# Patient Record
Sex: Female | Born: 1965 | Race: White | Hispanic: No | Marital: Married | Smoking: Never smoker
Health system: Southern US, Community
[De-identification: ages and names within clinical notes are randomized; demographics above are authoritative.]

## PROBLEM LIST (undated history)

## (undated) DIAGNOSIS — M1990 Unspecified osteoarthritis, unspecified site: Secondary | ICD-10-CM

## (undated) DIAGNOSIS — K219 Gastro-esophageal reflux disease without esophagitis: Secondary | ICD-10-CM

## (undated) DIAGNOSIS — J189 Pneumonia, unspecified organism: Secondary | ICD-10-CM

## (undated) DIAGNOSIS — I739 Peripheral vascular disease, unspecified: Secondary | ICD-10-CM

## (undated) DIAGNOSIS — E0590 Thyrotoxicosis, unspecified without thyrotoxic crisis or storm: Secondary | ICD-10-CM

## (undated) HISTORY — PX: TONSILLECTOMY: SUR1361

---

## 2015-01-10 ENCOUNTER — Ambulatory Visit: Payer: Self-pay | Admitting: Family Medicine

## 2020-09-08 ENCOUNTER — Ambulatory Visit: Payer: Self-pay | Admitting: Dermatology

## 2020-11-16 ENCOUNTER — Ambulatory Visit: Payer: Self-pay | Admitting: Dermatology

## 2020-11-17 ENCOUNTER — Ambulatory Visit: Payer: Self-pay | Admitting: Dermatology

## 2020-12-30 ENCOUNTER — Ambulatory Visit: Payer: Self-pay | Admitting: Dermatology

## 2021-02-18 ENCOUNTER — Ambulatory Visit: Payer: Self-pay | Admitting: Dermatology

## 2021-05-18 ENCOUNTER — Ambulatory Visit (INDEPENDENT_AMBULATORY_CARE_PROVIDER_SITE_OTHER): Payer: BC Managed Care – PPO | Admitting: Dermatology

## 2021-05-18 ENCOUNTER — Other Ambulatory Visit: Payer: Self-pay

## 2021-05-18 ENCOUNTER — Encounter: Payer: Self-pay | Admitting: Dermatology

## 2021-05-18 DIAGNOSIS — S70362A Insect bite (nonvenomous), left thigh, initial encounter: Secondary | ICD-10-CM | POA: Diagnosis not present

## 2021-05-18 DIAGNOSIS — C4491 Basal cell carcinoma of skin, unspecified: Secondary | ICD-10-CM

## 2021-05-18 DIAGNOSIS — C44319 Basal cell carcinoma of skin of other parts of face: Secondary | ICD-10-CM

## 2021-05-18 DIAGNOSIS — W57XXXA Bitten or stung by nonvenomous insect and other nonvenomous arthropods, initial encounter: Secondary | ICD-10-CM | POA: Diagnosis not present

## 2021-05-18 DIAGNOSIS — L820 Inflamed seborrheic keratosis: Secondary | ICD-10-CM

## 2021-05-18 DIAGNOSIS — D485 Neoplasm of uncertain behavior of skin: Secondary | ICD-10-CM

## 2021-05-18 DIAGNOSIS — L570 Actinic keratosis: Secondary | ICD-10-CM | POA: Diagnosis not present

## 2021-05-18 DIAGNOSIS — Z1283 Encounter for screening for malignant neoplasm of skin: Secondary | ICD-10-CM | POA: Diagnosis not present

## 2021-05-18 HISTORY — DX: Basal cell carcinoma of skin, unspecified: C44.91

## 2021-05-25 ENCOUNTER — Telehealth: Payer: Self-pay

## 2021-05-25 NOTE — Telephone Encounter (Signed)
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  She can choose between [place] and [place].  She has other ongoing medical issues and this is certainly not an emergency.

## 2021-05-25 NOTE — Telephone Encounter (Signed)
Patient aware mohs forehead

## 2021-05-26 ENCOUNTER — Telehealth: Payer: Self-pay | Admitting: *Deleted

## 2021-05-26 ENCOUNTER — Encounter: Payer: Self-pay | Admitting: *Deleted

## 2021-05-26 NOTE — Telephone Encounter (Signed)
Sent referral to skin surgery center for treatment of forehead.

## 2021-05-26 NOTE — Telephone Encounter (Signed)
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  She can choose between [place] and [place].  She has other ongoing medical issues and this is certainly not an emergency.

## 2021-06-01 ENCOUNTER — Encounter: Payer: Self-pay | Admitting: Dermatology

## 2021-06-01 NOTE — Progress Notes (Signed)
Follow-Up Visit   Subjective  Kristie Brooks is a 55 y.o. female who presents for the following: Annual Exam (Forehead x months scale comes and goes, front chest shoulder area, post thigh and front thigh picked and bleeding).  General skin examination with multiple new spots to check including tick bite on leg Location:  Duration:  Quality:  Associated Signs/Symptoms: Modifying Factors:  Severity:  Timing: Context:   Objective  Well appearing patient in no apparent distress; mood and affect are within normal limits. Left Anterior Neck       Left Zygomatic Area       Mid Forehead       Right Zygomatic Area         A full examination was performed including scalp, head, eyes, ears, nose, lips, neck, chest, axillae, abdomen, back, buttocks, bilateral upper extremities, bilateral lower extremities, hands, feet, fingers, toes, fingernails, and toenails. All findings within normal limits unless otherwise noted below.  Areas beneath undergarments and leg dressings not fully examined.   Assessment & Plan    Neoplasm of uncertain behavior of skin (4) Left Anterior Neck  Skin / nail biopsy Type of biopsy: tangential   Informed consent: discussed and consent obtained   Timeout: patient name, date of birth, surgical site, and procedure verified   Procedure prep:  Patient was prepped and draped in usual sterile fashion (Non sterile) Prep type:  Chlorhexidine Anesthesia: the lesion was anesthetized in a standard fashion   Anesthetic:  1% lidocaine w/ epinephrine 1-100,000 local infiltration Instrument used: flexible razor blade   Hemostasis achieved with: ferric subsulfate and electrodesiccation   Outcome: patient tolerated procedure well   Post-procedure details: sterile dressing applied and wound care instructions given   Dressing type: bandage and petrolatum    Specimen 1 - Surgical pathology Differential Diagnosis: R/O Tag - cautery   Check  Margins: No  Left Zygomatic Area  Skin / nail biopsy Type of biopsy: tangential   Informed consent: discussed and consent obtained   Timeout: patient name, date of birth, surgical site, and procedure verified   Procedure prep:  Patient was prepped and draped in usual sterile fashion (Non sterile) Prep type:  Chlorhexidine Anesthesia: the lesion was anesthetized in a standard fashion   Anesthetic:  1% lidocaine w/ epinephrine 1-100,000 local infiltration Instrument used: flexible razor blade   Hemostasis achieved with: ferric subsulfate and electrodesiccation   Outcome: patient tolerated procedure well   Post-procedure details: sterile dressing applied and wound care instructions given   Dressing type: bandage and petrolatum    Specimen 2 - Surgical pathology Differential Diagnosis: R/O ISK - cautery  Check Margins: No  Mid Forehead  Skin / nail biopsy Type of biopsy: tangential   Informed consent: discussed and consent obtained   Timeout: patient name, date of birth, surgical site, and procedure verified   Procedure prep:  Patient was prepped and draped in usual sterile fashion (Non sterile) Prep type:  Chlorhexidine Anesthesia: the lesion was anesthetized in a standard fashion   Anesthetic:  1% lidocaine w/ epinephrine 1-100,000 local infiltration Instrument used: flexible razor blade   Hemostasis achieved with: ferric subsulfate   Outcome: patient tolerated procedure well   Post-procedure details: sterile dressing applied and wound care instructions given   Dressing type: bandage and petrolatum    Specimen 3 - Surgical pathology Differential Diagnosis: R/O BCC vs SCC  Check Margins: No  Right Zygomatic Area  Skin / nail biopsy Type of biopsy: tangential  Informed consent: discussed and consent obtained   Timeout: patient name, date of birth, surgical site, and procedure verified   Procedure prep:  Patient was prepped and draped in usual sterile fashion (Non  sterile) Prep type:  Chlorhexidine Anesthesia: the lesion was anesthetized in a standard fashion   Anesthetic:  1% lidocaine w/ epinephrine 1-100,000 local infiltration Instrument used: flexible razor blade   Hemostasis achieved with: ferric subsulfate and electrodesiccation   Outcome: patient tolerated procedure well   Post-procedure details: sterile dressing applied and wound care instructions given   Dressing type: bandage and petrolatum    Specimen 4 - Surgical pathology Differential Diagnosis: R/O ISK - cautery  Check Margins: No  Inflamed seborrheic keratosis Left Breast  AK (actinic keratosis) (2) Right Breast; Right Dorsal Hand  Destruction of lesion - Right Breast, Right Dorsal Hand Complexity: simple   Destruction method: cryotherapy   Informed consent: discussed and consent obtained   Timeout:  patient name, date of birth, surgical site, and procedure verified Lesion destroyed using liquid nitrogen: Yes   Cryotherapy cycles:  3 Outcome: patient tolerated procedure well with no complications   Post-procedure details: wound care instructions given    Tick bite of thigh, left, initial encounter Left Thigh - Posterior   Patient was tearful during much of the early part of the visit; she has suffered a great deal relating to her leg lesions.   I, Lavonna Monarch, MD, have reviewed all documentation for this visit.  The documentation on 06/01/21 for the exam, diagnosis, procedures, and orders are all accurate and complete.

## 2021-12-15 ENCOUNTER — Emergency Department (HOSPITAL_COMMUNITY): Payer: Managed Care, Other (non HMO)

## 2021-12-15 ENCOUNTER — Other Ambulatory Visit: Payer: Self-pay

## 2021-12-15 ENCOUNTER — Inpatient Hospital Stay (HOSPITAL_COMMUNITY)
Admission: EM | Admit: 2021-12-15 | Discharge: 2021-12-26 | DRG: 871 | Disposition: A | Discharge status: Skilled Nursing Facility | Payer: Managed Care, Other (non HMO) | Source: Skilled Nursing Facility | Attending: Internal Medicine | Admitting: Internal Medicine

## 2021-12-15 ENCOUNTER — Encounter (HOSPITAL_COMMUNITY): Payer: Self-pay

## 2021-12-15 DIAGNOSIS — K920 Hematemesis: Secondary | ICD-10-CM

## 2021-12-15 DIAGNOSIS — D696 Thrombocytopenia, unspecified: Secondary | ICD-10-CM | POA: Diagnosis present

## 2021-12-15 DIAGNOSIS — A419 Sepsis, unspecified organism: Secondary | ICD-10-CM | POA: Diagnosis not present

## 2021-12-15 DIAGNOSIS — Z79890 Hormone replacement therapy (postmenopausal): Secondary | ICD-10-CM

## 2021-12-15 DIAGNOSIS — D689 Coagulation defect, unspecified: Secondary | ICD-10-CM | POA: Diagnosis present

## 2021-12-15 DIAGNOSIS — I9589 Other hypotension: Secondary | ICD-10-CM | POA: Diagnosis present

## 2021-12-15 DIAGNOSIS — B942 Sequelae of viral hepatitis: Secondary | ICD-10-CM

## 2021-12-15 DIAGNOSIS — R579 Shock, unspecified: Secondary | ICD-10-CM

## 2021-12-15 DIAGNOSIS — K2100 Gastro-esophageal reflux disease with esophagitis, without bleeding: Secondary | ICD-10-CM | POA: Diagnosis present

## 2021-12-15 DIAGNOSIS — K7469 Other cirrhosis of liver: Secondary | ICD-10-CM | POA: Diagnosis present

## 2021-12-15 DIAGNOSIS — I10 Essential (primary) hypertension: Secondary | ICD-10-CM | POA: Diagnosis present

## 2021-12-15 DIAGNOSIS — E8809 Other disorders of plasma-protein metabolism, not elsewhere classified: Secondary | ICD-10-CM | POA: Diagnosis present

## 2021-12-15 DIAGNOSIS — L89322 Pressure ulcer of left buttock, stage 2: Secondary | ICD-10-CM | POA: Diagnosis present

## 2021-12-15 DIAGNOSIS — J302 Other seasonal allergic rhinitis: Secondary | ICD-10-CM | POA: Diagnosis present

## 2021-12-15 DIAGNOSIS — I890 Lymphedema, not elsewhere classified: Secondary | ICD-10-CM | POA: Diagnosis present

## 2021-12-15 DIAGNOSIS — L03116 Cellulitis of left lower limb: Secondary | ICD-10-CM | POA: Diagnosis present

## 2021-12-15 DIAGNOSIS — L03115 Cellulitis of right lower limb: Secondary | ICD-10-CM | POA: Diagnosis present

## 2021-12-15 DIAGNOSIS — R609 Edema, unspecified: Secondary | ICD-10-CM | POA: Diagnosis not present

## 2021-12-15 DIAGNOSIS — I872 Venous insufficiency (chronic) (peripheral): Secondary | ICD-10-CM | POA: Diagnosis present

## 2021-12-15 DIAGNOSIS — G9341 Metabolic encephalopathy: Secondary | ICD-10-CM | POA: Diagnosis present

## 2021-12-15 DIAGNOSIS — L8990 Pressure ulcer of unspecified site, unspecified stage: Secondary | ICD-10-CM | POA: Insufficient documentation

## 2021-12-15 DIAGNOSIS — J9611 Chronic respiratory failure with hypoxia: Secondary | ICD-10-CM | POA: Diagnosis present

## 2021-12-15 DIAGNOSIS — R188 Other ascites: Secondary | ICD-10-CM

## 2021-12-15 DIAGNOSIS — Z85828 Personal history of other malignant neoplasm of skin: Secondary | ICD-10-CM

## 2021-12-15 DIAGNOSIS — Z8249 Family history of ischemic heart disease and other diseases of the circulatory system: Secondary | ICD-10-CM

## 2021-12-15 DIAGNOSIS — Z79899 Other long term (current) drug therapy: Secondary | ICD-10-CM

## 2021-12-15 DIAGNOSIS — L03119 Cellulitis of unspecified part of limb: Secondary | ICD-10-CM

## 2021-12-15 DIAGNOSIS — K449 Diaphragmatic hernia without obstruction or gangrene: Secondary | ICD-10-CM | POA: Diagnosis present

## 2021-12-15 DIAGNOSIS — E872 Acidosis, unspecified: Secondary | ICD-10-CM | POA: Diagnosis present

## 2021-12-15 DIAGNOSIS — K7011 Alcoholic hepatitis with ascites: Secondary | ICD-10-CM

## 2021-12-15 DIAGNOSIS — E039 Hypothyroidism, unspecified: Secondary | ICD-10-CM | POA: Diagnosis present

## 2021-12-15 DIAGNOSIS — R6520 Severe sepsis without septic shock: Secondary | ICD-10-CM | POA: Diagnosis present

## 2021-12-15 DIAGNOSIS — Z6841 Body Mass Index (BMI) 40.0 and over, adult: Secondary | ICD-10-CM

## 2021-12-15 DIAGNOSIS — R601 Generalized edema: Secondary | ICD-10-CM

## 2021-12-15 DIAGNOSIS — K7460 Unspecified cirrhosis of liver: Secondary | ICD-10-CM

## 2021-12-15 DIAGNOSIS — K3189 Other diseases of stomach and duodenum: Secondary | ICD-10-CM | POA: Diagnosis present

## 2021-12-15 DIAGNOSIS — Z20822 Contact with and (suspected) exposure to covid-19: Secondary | ICD-10-CM | POA: Diagnosis present

## 2021-12-15 DIAGNOSIS — K5641 Fecal impaction: Secondary | ICD-10-CM | POA: Diagnosis present

## 2021-12-15 DIAGNOSIS — E876 Hypokalemia: Secondary | ICD-10-CM | POA: Diagnosis not present

## 2021-12-15 DIAGNOSIS — K7682 Hepatic encephalopathy: Secondary | ICD-10-CM | POA: Diagnosis present

## 2021-12-15 LAB — COMPREHENSIVE METABOLIC PANEL
ALT: 15 U/L (ref 0–44)
AST: 34 U/L (ref 15–41)
Albumin: 1.9 g/dL — ABNORMAL LOW (ref 3.5–5.0)
Alkaline Phosphatase: 103 U/L (ref 38–126)
Anion gap: 8 (ref 5–15)
BUN: 14 mg/dL (ref 6–20)
CO2: 26 mmol/L (ref 22–32)
Calcium: 8 mg/dL — ABNORMAL LOW (ref 8.9–10.3)
Chloride: 99 mmol/L (ref 98–111)
Creatinine, Ser: 1.03 mg/dL — ABNORMAL HIGH (ref 0.44–1.00)
GFR, Estimated: >60 mL/min (ref >60)
Glucose, Bld: 103 mg/dL — ABNORMAL HIGH (ref 70–99)
Potassium: 3.7 mmol/L (ref 3.5–5.1)
Sodium: 133 mmol/L — ABNORMAL LOW (ref 135–145)
Total Bilirubin: 1.8 mg/dL — ABNORMAL HIGH (ref 0.3–1.2)
Total Protein: 5.1 g/dL — ABNORMAL LOW (ref 6.5–8.1)

## 2021-12-15 LAB — CBC WITH DIFFERENTIAL/PLATELET
Abs Immature Granulocytes: 0.15 10*3/uL — ABNORMAL HIGH (ref 0.00–0.07)
Basophils Absolute: 0.1 10*3/uL (ref 0.0–0.1)
Basophils Relative: 1 %
Eosinophils Absolute: 0 10*3/uL (ref 0.0–0.5)
Eosinophils Relative: 0 %
HCT: 34.9 % — ABNORMAL LOW (ref 36.0–46.0)
Hemoglobin: 11.3 g/dL — ABNORMAL LOW (ref 12.0–15.0)
Immature Granulocytes: 1 %
Lymphocytes Relative: 5 %
Lymphs Abs: 0.8 10*3/uL (ref 0.7–4.0)
MCH: 29.9 pg (ref 26.0–34.0)
MCHC: 32.4 g/dL (ref 30.0–36.0)
MCV: 92.3 fL (ref 80.0–100.0)
Monocytes Absolute: 0.6 10*3/uL (ref 0.1–1.0)
Monocytes Relative: 4 %
Neutro Abs: 14.2 10*3/uL — ABNORMAL HIGH (ref 1.7–7.7)
Neutrophils Relative %: 89 %
Platelets: 126 10*3/uL — ABNORMAL LOW (ref 150–400)
RBC: 3.78 MIL/uL — ABNORMAL LOW (ref 3.87–5.11)
RDW: 17.4 % — ABNORMAL HIGH (ref 11.5–15.5)
WBC: 15.9 10*3/uL — ABNORMAL HIGH (ref 4.0–10.5)
nRBC: 0 % (ref 0.0–0.2)

## 2021-12-15 LAB — PROTIME-INR
INR: 1.5 — ABNORMAL HIGH (ref 0.8–1.2)
Prothrombin Time: 17.6 seconds — ABNORMAL HIGH (ref 11.4–15.2)

## 2021-12-15 LAB — RESP PANEL BY RT-PCR (FLU A&B, COVID) ARPGX2
Influenza A by PCR: NEGATIVE
Influenza B by PCR: NEGATIVE
SARS Coronavirus 2 by RT PCR: NEGATIVE

## 2021-12-15 LAB — APTT: aPTT: 30 seconds (ref 24–36)

## 2021-12-15 LAB — TROPONIN I (HIGH SENSITIVITY): Troponin I (High Sensitivity): 6 ng/L (ref <18)

## 2021-12-15 LAB — BRAIN NATRIURETIC PEPTIDE: B Natriuretic Peptide: 305 pg/mL — ABNORMAL HIGH (ref 0.0–100.0)

## 2021-12-15 LAB — HCG, QUANTITATIVE, PREGNANCY: hCG, Beta Chain, Quant, S: 1 m[IU]/mL (ref <5)

## 2021-12-15 LAB — LACTIC ACID, PLASMA: Lactic Acid, Venous: 2.3 mmol/L (ref 0.5–1.9)

## 2021-12-15 MED ORDER — FENTANYL CITRATE PF 50 MCG/ML IJ SOSY
50.0000 ug | PREFILLED_SYRINGE | Freq: Once | INTRAMUSCULAR | Status: AC
  Administered 2021-12-16: 50 ug via INTRAVENOUS
  Filled 2021-12-15: qty 1

## 2021-12-15 MED ORDER — SODIUM CHLORIDE 0.9 % IV SOLN
2.0000 g | Freq: Once | INTRAVENOUS | Status: AC
  Administered 2021-12-16: 2 g via INTRAVENOUS
  Filled 2021-12-15: qty 2

## 2021-12-15 MED ORDER — SODIUM CHLORIDE 0.9 % IV BOLUS
1000.0000 mL | Freq: Once | INTRAVENOUS | Status: AC
  Administered 2021-12-16: 1000 mL via INTRAVENOUS

## 2021-12-15 MED ORDER — VANCOMYCIN HCL 2000 MG/400ML IV SOLN
2000.0000 mg | Freq: Once | INTRAVENOUS | Status: AC
  Administered 2021-12-16: 01:00:00 2000 mg via INTRAVENOUS
  Filled 2021-12-15: qty 400

## 2021-12-15 NOTE — ED Triage Notes (Signed)
Pt is from [facility] in [place], has swelling in the legs and some weaping which she is being treated for with lasix.  Pt sent for a temp of 100.4 which is a new finding. Worried that she is getting infection

## 2021-12-16 ENCOUNTER — Inpatient Hospital Stay (HOSPITAL_COMMUNITY): Payer: Managed Care, Other (non HMO)

## 2021-12-16 ENCOUNTER — Emergency Department (HOSPITAL_COMMUNITY): Payer: Managed Care, Other (non HMO)

## 2021-12-16 ENCOUNTER — Inpatient Hospital Stay: Payer: Self-pay

## 2021-12-16 DIAGNOSIS — R609 Edema, unspecified: Secondary | ICD-10-CM | POA: Diagnosis present

## 2021-12-16 DIAGNOSIS — R600 Localized edema: Secondary | ICD-10-CM | POA: Diagnosis not present

## 2021-12-16 DIAGNOSIS — D696 Thrombocytopenia, unspecified: Secondary | ICD-10-CM | POA: Diagnosis present

## 2021-12-16 DIAGNOSIS — A419 Sepsis, unspecified organism: Secondary | ICD-10-CM | POA: Diagnosis present

## 2021-12-16 DIAGNOSIS — B942 Sequelae of viral hepatitis: Secondary | ICD-10-CM | POA: Diagnosis not present

## 2021-12-16 DIAGNOSIS — I872 Venous insufficiency (chronic) (peripheral): Secondary | ICD-10-CM | POA: Diagnosis present

## 2021-12-16 DIAGNOSIS — E872 Acidosis, unspecified: Secondary | ICD-10-CM | POA: Diagnosis present

## 2021-12-16 DIAGNOSIS — E876 Hypokalemia: Secondary | ICD-10-CM | POA: Diagnosis not present

## 2021-12-16 DIAGNOSIS — G9340 Encephalopathy, unspecified: Secondary | ICD-10-CM | POA: Diagnosis not present

## 2021-12-16 DIAGNOSIS — E8809 Other disorders of plasma-protein metabolism, not elsewhere classified: Secondary | ICD-10-CM | POA: Diagnosis present

## 2021-12-16 DIAGNOSIS — R188 Other ascites: Secondary | ICD-10-CM | POA: Diagnosis present

## 2021-12-16 DIAGNOSIS — Z6841 Body Mass Index (BMI) 40.0 and over, adult: Secondary | ICD-10-CM | POA: Diagnosis not present

## 2021-12-16 DIAGNOSIS — J9611 Chronic respiratory failure with hypoxia: Secondary | ICD-10-CM | POA: Diagnosis present

## 2021-12-16 DIAGNOSIS — R6520 Severe sepsis without septic shock: Secondary | ICD-10-CM | POA: Diagnosis present

## 2021-12-16 DIAGNOSIS — L03116 Cellulitis of left lower limb: Secondary | ICD-10-CM | POA: Diagnosis present

## 2021-12-16 DIAGNOSIS — G9341 Metabolic encephalopathy: Secondary | ICD-10-CM | POA: Diagnosis present

## 2021-12-16 DIAGNOSIS — E039 Hypothyroidism, unspecified: Secondary | ICD-10-CM | POA: Diagnosis present

## 2021-12-16 DIAGNOSIS — D689 Coagulation defect, unspecified: Secondary | ICD-10-CM | POA: Diagnosis present

## 2021-12-16 DIAGNOSIS — K7031 Alcoholic cirrhosis of liver with ascites: Secondary | ICD-10-CM | POA: Diagnosis not present

## 2021-12-16 DIAGNOSIS — K7682 Hepatic encephalopathy: Secondary | ICD-10-CM | POA: Diagnosis present

## 2021-12-16 DIAGNOSIS — R6521 Severe sepsis with septic shock: Secondary | ICD-10-CM | POA: Diagnosis not present

## 2021-12-16 DIAGNOSIS — I890 Lymphedema, not elsewhere classified: Secondary | ICD-10-CM | POA: Diagnosis present

## 2021-12-16 DIAGNOSIS — K7460 Unspecified cirrhosis of liver: Secondary | ICD-10-CM | POA: Diagnosis not present

## 2021-12-16 DIAGNOSIS — K920 Hematemesis: Secondary | ICD-10-CM | POA: Diagnosis present

## 2021-12-16 DIAGNOSIS — R601 Generalized edema: Secondary | ICD-10-CM | POA: Diagnosis not present

## 2021-12-16 DIAGNOSIS — K449 Diaphragmatic hernia without obstruction or gangrene: Secondary | ICD-10-CM | POA: Diagnosis not present

## 2021-12-16 DIAGNOSIS — Z515 Encounter for palliative care: Secondary | ICD-10-CM | POA: Diagnosis not present

## 2021-12-16 DIAGNOSIS — I10 Essential (primary) hypertension: Secondary | ICD-10-CM | POA: Diagnosis present

## 2021-12-16 DIAGNOSIS — Z7189 Other specified counseling: Secondary | ICD-10-CM | POA: Diagnosis not present

## 2021-12-16 DIAGNOSIS — K7469 Other cirrhosis of liver: Secondary | ICD-10-CM | POA: Diagnosis present

## 2021-12-16 DIAGNOSIS — Z20822 Contact with and (suspected) exposure to covid-19: Secondary | ICD-10-CM | POA: Diagnosis present

## 2021-12-16 DIAGNOSIS — L89322 Pressure ulcer of left buttock, stage 2: Secondary | ICD-10-CM | POA: Diagnosis present

## 2021-12-16 DIAGNOSIS — K2289 Other specified disease of esophagus: Secondary | ICD-10-CM | POA: Diagnosis not present

## 2021-12-16 DIAGNOSIS — L03119 Cellulitis of unspecified part of limb: Secondary | ICD-10-CM | POA: Diagnosis not present

## 2021-12-16 DIAGNOSIS — K209 Esophagitis, unspecified without bleeding: Secondary | ICD-10-CM | POA: Diagnosis not present

## 2021-12-16 DIAGNOSIS — L03115 Cellulitis of right lower limb: Secondary | ICD-10-CM | POA: Diagnosis present

## 2021-12-16 DIAGNOSIS — R579 Shock, unspecified: Secondary | ICD-10-CM | POA: Diagnosis not present

## 2021-12-16 LAB — COMPREHENSIVE METABOLIC PANEL
ALT: 12 U/L (ref 0–44)
AST: 27 U/L (ref 15–41)
Albumin: 2.9 g/dL — ABNORMAL LOW (ref 3.5–5.0)
Alkaline Phosphatase: 63 U/L (ref 38–126)
Anion gap: 10 (ref 5–15)
BUN: 16 mg/dL (ref 6–20)
CO2: 23 mmol/L (ref 22–32)
Calcium: 7.9 mg/dL — ABNORMAL LOW (ref 8.9–10.3)
Chloride: 101 mmol/L (ref 98–111)
Creatinine, Ser: 1.05 mg/dL — ABNORMAL HIGH (ref 0.44–1.00)
GFR, Estimated: >60 mL/min (ref >60)
Glucose, Bld: 157 mg/dL — ABNORMAL HIGH (ref 70–99)
Potassium: 3.4 mmol/L — ABNORMAL LOW (ref 3.5–5.1)
Sodium: 134 mmol/L — ABNORMAL LOW (ref 135–145)
Total Bilirubin: 1.7 mg/dL — ABNORMAL HIGH (ref 0.3–1.2)
Total Protein: 4.9 g/dL — ABNORMAL LOW (ref 6.5–8.1)

## 2021-12-16 LAB — ECHOCARDIOGRAM COMPLETE
AR max vel: 2.39 cm2
AV Area VTI: 2.25 cm2
AV Area mean vel: 2.65 cm2
AV Mean grad: 8 mmHg
AV Peak grad: 16.6 mmHg
Ao pk vel: 2.04 m/s
Area-P 1/2: 3.13 cm2
Calc EF: 57.8 %
Height: 65 in
MV VTI: 3.86 cm2
S' Lateral: 3.2 cm
Single Plane A2C EF: 56.9 %
Single Plane A4C EF: 60.9 %
Weight: 4640 oz

## 2021-12-16 LAB — CORTISOL: Cortisol, Plasma: 19.2 ug/dL

## 2021-12-16 LAB — URINALYSIS, ROUTINE W REFLEX MICROSCOPIC
Bilirubin Urine: NEGATIVE
Glucose, UA: NEGATIVE mg/dL
Hgb urine dipstick: NEGATIVE
Ketones, ur: NEGATIVE mg/dL
Leukocytes,Ua: NEGATIVE
Nitrite: NEGATIVE
Protein, ur: 30 mg/dL — AB
Specific Gravity, Urine: 1.01 (ref 1.005–1.030)
pH: 6.5 (ref 5.0–8.0)

## 2021-12-16 LAB — AMMONIA: Ammonia: 33 umol/L (ref 9–35)

## 2021-12-16 LAB — URINALYSIS, MICROSCOPIC (REFLEX): Squamous Epithelial / HPF: NONE SEEN (ref 0–5)

## 2021-12-16 LAB — POTASSIUM: Potassium: 3.8 mmol/L (ref 3.5–5.1)

## 2021-12-16 LAB — HIV ANTIBODY (ROUTINE TESTING W REFLEX): HIV Screen 4th Generation wRfx: NONREACTIVE

## 2021-12-16 LAB — TROPONIN I (HIGH SENSITIVITY): Troponin I (High Sensitivity): 7 ng/L (ref <18)

## 2021-12-16 LAB — LACTIC ACID, PLASMA: Lactic Acid, Venous: 2 mmol/L (ref 0.5–1.9)

## 2021-12-16 MED ORDER — ONDANSETRON HCL 4 MG PO TABS
4.0000 mg | ORAL_TABLET | Freq: Four times a day (QID) | ORAL | Status: DC | PRN
  Administered 2021-12-22: 18:00:00 4 mg via ORAL
  Filled 2021-12-16: qty 1

## 2021-12-16 MED ORDER — ALBUMIN HUMAN 25 % IV SOLN
50.0000 g | Freq: Four times a day (QID) | INTRAVENOUS | Status: AC
  Administered 2021-12-16 (×2): 50 g via INTRAVENOUS
  Filled 2021-12-16 (×3): qty 200

## 2021-12-16 MED ORDER — HEPARIN SODIUM (PORCINE) 5000 UNIT/ML IJ SOLN
5000.0000 [IU] | Freq: Three times a day (TID) | INTRAMUSCULAR | Status: DC
  Administered 2021-12-16 – 2021-12-19 (×10): 5000 [IU] via SUBCUTANEOUS
  Filled 2021-12-16 (×9): qty 1

## 2021-12-16 MED ORDER — MIDODRINE HCL 5 MG PO TABS
5.0000 mg | ORAL_TABLET | Freq: Three times a day (TID) | ORAL | Status: DC
  Administered 2021-12-16 – 2021-12-19 (×9): 5 mg via ORAL
  Filled 2021-12-16 (×9): qty 1

## 2021-12-16 MED ORDER — ALBUMIN HUMAN 25 % IV SOLN
INTRAVENOUS | Status: AC
  Filled 2021-12-16: qty 100

## 2021-12-16 MED ORDER — LACTATED RINGERS IV BOLUS
2000.0000 mL | Freq: Once | INTRAVENOUS | Status: AC
  Administered 2021-12-16: 02:00:00 2000 mL via INTRAVENOUS

## 2021-12-16 MED ORDER — ALBUMIN HUMAN 25 % IV SOLN
25.0000 g | Freq: Once | INTRAVENOUS | Status: AC
  Administered 2021-12-16: 06:00:00 25 g via INTRAVENOUS
  Filled 2021-12-16: qty 100

## 2021-12-16 MED ORDER — VANCOMYCIN HCL 750 MG/150ML IV SOLN
750.0000 mg | Freq: Two times a day (BID) | INTRAVENOUS | Status: DC
  Administered 2021-12-16 – 2021-12-19 (×6): 750 mg via INTRAVENOUS
  Filled 2021-12-16 (×9): qty 150

## 2021-12-16 MED ORDER — OXYCODONE HCL 5 MG PO TABS
5.0000 mg | ORAL_TABLET | ORAL | Status: DC | PRN
  Administered 2021-12-16 – 2021-12-26 (×7): 5 mg via ORAL
  Filled 2021-12-16 (×9): qty 1

## 2021-12-16 MED ORDER — CHLORHEXIDINE GLUCONATE CLOTH 2 % EX PADS
6.0000 | MEDICATED_PAD | Freq: Every day | CUTANEOUS | Status: DC
  Administered 2021-12-16 – 2021-12-26 (×10): 6 via TOPICAL

## 2021-12-16 MED ORDER — IBUPROFEN 400 MG PO TABS: 400.0000 mg | ORAL_TABLET | Freq: Four times a day (QID) | ORAL | Status: DC | PRN

## 2021-12-16 MED ORDER — ONDANSETRON HCL 4 MG/2ML IJ SOLN
4.0000 mg | Freq: Four times a day (QID) | INTRAMUSCULAR | Status: DC | PRN
  Administered 2021-12-17 – 2021-12-23 (×4): 4 mg via INTRAVENOUS
  Filled 2021-12-16 (×4): qty 2

## 2021-12-16 MED ORDER — IOHEXOL 300 MG/ML  SOLN
100.0000 mL | Freq: Once | INTRAMUSCULAR | Status: AC | PRN
  Administered 2021-12-16: 01:00:00 100 mL via INTRAVENOUS

## 2021-12-16 NOTE — Progress Notes (Signed)
*  PRELIMINARY RESULTS* Echocardiogram 2D Echocardiogram has been performed.  Kristie Brooks 12/16/2021, 10:29 AM

## 2021-12-16 NOTE — ED Notes (Signed)
Pt in bed, pt awake and oriented to person, place, month and day of the week, pt reports some leg pain, pt requests pain med.

## 2021-12-16 NOTE — Sepsis Progress Note (Signed)
[facility] following for Sepsis Protocol 

## 2021-12-16 NOTE — H&P (Signed)
TRH H&P    Patient Demographics:    Kristie Brooks, is a 55 y.o. female  MRN: [id number]  DOB - Sep 16, 1966  Admit Date - 12/15/2021  Referring MD/NP/PA: Sedonia Small  Outpatient Primary MD for the patient is Keane Police, MD  Patient coming from: SNF  Chief complaint- Fever and confusion   HPI:    Kristie Brooks  is a 55 y.o. female, with history of cirrhosis, presents ED for chief complaint of what she says is fluid overload.  What was reported by EMS states that she was sent in for fever and confusion.  Does report that her husband said she was confused that she has been thinks that she was.  She reports that she had fluid overload in her legs and thighs for 3 or 4 days.  She reports they have been weeping for 3 or 4 days.  She is also had erythema for 3 or 4 days.  She reports that all the symptoms started the same time.  She had fevers but she is unsure how high.  She denies any confusion.  She denies any cough, dysuria, chest pain, and vomiting.  She does report nausea intermittently since September.  She denies any diarrhea.  Patient reports no change in her normal abdominal pain.  She has had abdominal pain for a long time per her report.  Patient reports no purulent drainage from her bilateral legs.  Bilateral legs are wrapped by the ED at the time of my exam.  She does not have a history of an echo on file with our system.  Patient has no further complaints at this time.  Patient does not smoke, does not drink, does not use illicit drugs.  Patient is vaccinated for COVID.  Patient is full code.  Patient used to be a Marine scientist.  In the ED Temp 99.2, heart rate 100-118, respiratory 17-27, blood pressure at admission 84/34 but as low as systolic in the 68T Troponin 6, 7 Elevated white blood cell count 15.9, hemoglobin 11.3, platelets 126 Chemistry panel is unremarkable Albumin 1.9 Negative respiratory  panel Blood culture pending CT pelvis shows marked diffuse subcu body wall edema with large volume ascites.  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 Together these findings are most in keeping with changes of portal venous hypertension Chest x-ray shows low lung volumes with mild to moderate severity bibasilar atelectasis and/or infiltrate with small left pleural effusion Patient was given cefepime, vancomycin however 3 L bolus Cintron was discussed with patient and patient declined.  She reports she still is 1 to be full code, but does not want a central line at this time.  Options for albumin discussed with patient.  Patient accepts albumin. Admission requested for possible sepsis secondary cellulitis   Review of systems:    In addition to the HPI above,  Admits to fever No Headache, No changes with Vision or hearing, No problems swallowing food or Liquids, No Chest pain, Cough or Shortness of Breath, No Abdominal pain, admits to nausea, bowel movements are  regular, No Blood in stool or Urine, No dysuria, No new skin rashes or bruises, No new joints pains-aches,  No new weakness, tingling, numbness in any extremity, No recent weight gain or loss, No polyuria, polydypsia or polyphagia, No significant Mental Stressors.  All other systems reviewed and are negative.    Past History of the following :    Past Medical History:  Diagnosis Date   Basal cell carcinoma 05/18/2021   nod-mid forehead ([facility])      History reviewed. No pertinent surgical history.    Social History:      Social History   Tobacco Use   Smoking status: Never   Smokeless tobacco: Never  Substance Use Topics   Alcohol use: Never       Family History :    History reviewed. No pertinent family history. Family history significant for hypertension   Home Medications:   Prior to Admission medications   Medication Sig Start Date End Date Taking? Authorizing  Provider  Calcium-Magnesium-Zinc [phone] MG TABS Take 1 tablet by mouth 2 (two) times daily. 02/21/20   [provider]  ciprofloxacin (CIPRO) 750 MG tablet SMARTSIG:1 Tablet(s) By Mouth Every 12 Hours 05/10/21   [provider]  doxycycline (VIBRA-TABS) 100 MG tablet Take 1 tablet by mouth 2 (two) times daily. 05/07/21   [provider]  fexofenadine (ALLEGRA) 180 MG tablet Take by mouth.    [provider]  HYDROcodone-acetaminophen (NORCO/VICODIN) 5-325 MG tablet Take by mouth. 07/10/20   [provider]  nystatin cream (MYCOSTATIN) Apply 1 application topically 2 (two) times daily. 03/31/21   [provider]     Allergies:     Allergies  Allergen Reactions   Zosyn [Piperacillin Sod-Tazobactam So] Other (See Comments)    Blood count dropped     Physical Exam:   Vitals  Blood pressure (!) 95/40, pulse (!) 111, temperature 99.2 F (37.3 C), temperature source Oral, resp. rate (!) 21, height 5\' 5"  (1.651 m), weight 131.5 kg, SpO2 92 %.   1.  General: Patient lying supine in bed, head of bed elevated, no acute distress   2. Psychiatric: Alert and oriented x 3, mood is irritable and behavior normal for situation, pleasant and cooperative with exam   3. Neurologic: Speech and language are normal, face is symmetric, moves all 4 extremities voluntarily, at baseline without acute deficits on limited exam   4. HEENMT:  Head is atraumatic, normocephalic, pupils reactive to light, neck is supple, trachea is midline, mucous membranes are moist   5. Respiratory : Lungs are clear to auscultation bilaterally without wheezing, rhonchi, rales, no cyanosis, no increase in work of breathing or accessory muscle use   6. Cardiovascular : Heart rate normal, rhythm is regular, no murmurs, rubs or gallop, peripheral pulses palpated   7. Gastrointestinal:  Abdomen is soft, nondistended, nontender to palpation bowel sounds active, no masses or  organomegaly palpated   8. Skin:  Skin is warm, dry and intact without rashes, acute lesions, or ulcers on limited exam   9.Musculoskeletal:  No acute deformities or trauma, no asymmetry in tone, significant peripheral edema with tenderness to palpation of bilateral lower extremities, wrapped by ED     Data Review:    CBC Recent Labs  Lab 12/15/21 2215  WBC 15.9*  HGB 11.3*  HCT 34.9*  PLT 126*  MCV 92.3  MCH 29.9  MCHC 32.4  RDW 17.4*  LYMPHSABS 0.8  MONOABS 0.6  EOSABS 0.0  BASOSABS 0.1   ------------------------------------------------------------------------------------------------------------------  Results for orders placed or performed during the hospital encounter of 12/15/21 (from the past 48 hour(s))  Resp Panel by RT-PCR (Flu A&B, Covid) Nasopharyngeal Swab     Status: None   Collection Time: 12/15/21  9:33 PM   Specimen: Nasopharyngeal Swab; Nasopharyngeal(NP) swabs in vial transport medium  Result Value Ref Range   SARS Coronavirus 2 by RT PCR NEGATIVE NEGATIVE    Comment: (NOTE) SARS-CoV-2 target nucleic acids are NOT DETECTED.  The SARS-CoV-2 RNA is generally detectable in upper respiratory specimens during the acute phase of infection. The lowest concentration of SARS-CoV-2 viral copies this assay can detect is 138 copies/mL. A negative result does not preclude SARS-Cov-2 infection and should not be used as the sole basis for treatment or other patient management decisions. A negative result may occur with  improper specimen collection/handling, submission of specimen other than nasopharyngeal swab, presence of viral mutation(s) within the areas targeted by this assay, and inadequate number of viral copies(<138 copies/mL). A negative result must be combined with clinical observations, patient history, and epidemiological information. The expected result is Negative.  Fact Sheet for Patients:  EntrepreneurPulse.com.au  Fact Sheet  for Healthcare Providers:  IncredibleEmployment.be  This test is no t yet approved or cleared by the [place] FDA and  has been authorized for detection and/or diagnosis of SARS-CoV-2 by FDA under an Emergency Use Authorization (EUA). This EUA will remain  in effect (meaning this test can be used) for the duration of the COVID-19 declaration under Section 564(b)(1) of the Act, 21 U.S.C.section 360bbb-3(b)(1), unless the authorization is terminated  or revoked sooner.       Influenza A by PCR NEGATIVE NEGATIVE   Influenza B by PCR NEGATIVE NEGATIVE    Comment: (NOTE) The Xpert Xpress SARS-CoV-2/FLU/RSV plus assay is intended as an aid in the diagnosis of influenza from Nasopharyngeal swab specimens and should not be used as a sole basis for treatment. Nasal washings and aspirates are unacceptable for Xpert Xpress SARS-CoV-2/FLU/RSV testing.  Fact Sheet for Patients: EntrepreneurPulse.com.au  Fact Sheet for Healthcare Providers: IncredibleEmployment.be  This test is not yet approved or cleared by the [place] FDA and has been authorized for detection and/or diagnosis of SARS-CoV-2 by FDA under an Emergency Use Authorization (EUA). This EUA will remain in effect (meaning this test can be used) for the duration of the COVID-19 declaration under Section 564(b)(1) of the Act, 21 U.S.C. section 360bbb-3(b)(1), unless the authorization is terminated or revoked.  Performed at [facility], [address] [place], [place] [address]   Lactic acid, plasma     Status: Abnormal   Collection Time: 12/15/21 10:15 PM  Result Value Ref Range   Lactic Acid, Venous 2.3 (HH) 0.5 - 1.9 mmol/L    Comment: CRITICAL RESULT CALLED TO, READ BACK BY AND VERIFIED WITH: WALKER,T@2251  BY MATTHEWS,B 12.21.22 Performed at [address] [place], [place] [address]   Comprehensive metabolic panel     Status: Abnormal    Collection Time: 12/15/21 10:15 PM  Result Value Ref Range   Sodium 133 (L) 135 - 145 mmol/L   Potassium 3.7 3.5 - 5.1 mmol/L   Chloride 99 98 - 111 mmol/L   CO2 26 22 - 32 mmol/L   Glucose, Bld 103 (H) 70 - 99 mg/dL    Comment: Glucose reference range applies only to samples taken after fasting for at least 8 hours.   BUN 14 6 - 20 mg/dL   Creatinine, Ser 1.03 (H) 0.44 -  1.00 mg/dL   Calcium 8.0 (L) 8.9 - 10.3 mg/dL   Total Protein 5.1 (L) 6.5 - 8.1 g/dL   Albumin 1.9 (L) 3.5 - 5.0 g/dL   AST 34 15 - 41 U/L   ALT 15 0 - 44 U/L   Alkaline Phosphatase 103 38 - 126 U/L   Total Bilirubin 1.8 (H) 0.3 - 1.2 mg/dL   GFR, Estimated >60 >60 mL/min    Comment: (NOTE) Calculated using the CKD-EPI Creatinine Equation (2021)    Anion gap 8 5 - 15    Comment: Performed at [facility], [address], [place], [place] [address]  CBC WITH DIFFERENTIAL     Status: Abnormal   Collection Time: 12/15/21 10:15 PM  Result Value Ref Range   WBC 15.9 (H) 4.0 - 10.5 K/uL   RBC 3.78 (L) 3.87 - 5.11 MIL/uL   Hemoglobin 11.3 (L) 12.0 - 15.0 g/dL   HCT 34.9 (L) 36.0 - 46.0 %   MCV 92.3 80.0 - 100.0 fL   MCH 29.9 26.0 - 34.0 pg   MCHC 32.4 30.0 - 36.0 g/dL   RDW 17.4 (H) 11.5 - 15.5 %   Platelets 126 (L) 150 - 400 K/uL   nRBC 0.0 0.0 - 0.2 %   Neutrophils Relative % 89 %   Neutro Abs 14.2 (H) 1.7 - 7.7 K/uL   Lymphocytes Relative 5 %   Lymphs Abs 0.8 0.7 - 4.0 K/uL   Monocytes Relative 4 %   Monocytes Absolute 0.6 0.1 - 1.0 K/uL   Eosinophils Relative 0 %   Eosinophils Absolute 0.0 0.0 - 0.5 K/uL   Basophils Relative 1 %   Basophils Absolute 0.1 0.0 - 0.1 K/uL   Immature Granulocytes 1 %   Abs Immature Granulocytes 0.15 (H) 0.00 - 0.07 K/uL    Comment: Performed at [facility], [address] [place], [place] [address]  Protime-INR     Status: Abnormal   Collection Time: 12/15/21 10:15 PM  Result Value Ref Range   Prothrombin Time 17.6 (H) 11.4 - 15.2 seconds   INR 1.5 (H) 0.8 - 1.2     Comment: (NOTE) INR goal varies based on device and disease states. Performed at [facility], [address], [place], [place] [address]   APTT     Status: None   Collection Time: 12/15/21 10:15 PM  Result Value Ref Range   aPTT 30 24 - 36 seconds    Comment: Performed at [facility], [address] [place], [place] [address]  Blood Culture (routine x 2)     Status: None (Preliminary result)   Collection Time: 12/15/21 10:15 PM   Specimen: BLOOD RIGHT ARM  Result Value Ref Range   Specimen Description BLOOD RIGHT ARM    Special Requests      BOTTLES DRAWN AEROBIC AND ANAEROBIC Blood Culture results may not be optimal due to an inadequate volume of blood received in culture bottles Performed at [facility], [address], [place], [place] [address]    Culture PENDING    Report Status PENDING   Blood Culture (routine x 2)     Status: None (Preliminary result)   Collection Time: 12/15/21 10:15 PM   Specimen: Right Antecubital; Blood  Result Value Ref Range   Specimen Description RIGHT ANTECUBITAL    Special Requests      BOTTLES DRAWN AEROBIC AND ANAEROBIC Blood Culture results may not be optimal due to an excessive volume of blood received in culture bottles Performed  at [facility], [address], [place], [place] [address]    Culture PENDING    Report Status PENDING   Troponin I (High Sensitivity)     Status: None   Collection Time: 12/15/21 10:15 PM  Result Value Ref Range   Troponin I (High Sensitivity) 6 <18 ng/L    Comment: (NOTE) Elevated high sensitivity troponin I (hsTnI) values and significant  changes across serial measurements may suggest ACS but many other  chronic and acute conditions are known to elevate hsTnI results.  Refer to the "Links" section for chest pain algorithms and additional  guidance. Performed at [facility], [address] [place], [place] [address]   Brain natriuretic peptide     Status: Abnormal   Collection Time: 12/15/21 10:15 PM   Result Value Ref Range   B Natriuretic Peptide 305.0 (H) 0.0 - 100.0 pg/mL    Comment: Performed at [facility], [address] [place], [place] [address]  hCG, quantitative, pregnancy     Status: None   Collection Time: 12/15/21 10:15 PM  Result Value Ref Range   hCG, Beta Chain, Quant, S 1 <5 mIU/mL    Comment:          GEST. AGE      CONC.  (mIU/mL)   <=1 WEEK        5 - 50     2 WEEKS       50 - 500     3 WEEKS       100 - 10,000     4 WEEKS     1,000 - 30,000     5 WEEKS     3,500 - 115,000   6-8 WEEKS     12,000 - 270,000    12 WEEKS     15,000 - 220,000        FEMALE AND NON-PREGNANT FEMALE:     LESS THAN 5 mIU/mL Performed at [facility], [address] [place], [place] [address]   Lactic acid, plasma     Status: Abnormal   Collection Time: 12/16/21 12:01 AM  Result Value Ref Range   Lactic Acid, Venous 2.0 (HH) 0.5 - 1.9 mmol/L    Comment: CRITICAL VALUE NOTED.  VALUE IS CONSISTENT WITH PREVIOUSLY REPORTED AND CALLED VALUE. Performed at [facility], [address], [place], [place] [address]   Troponin I (High Sensitivity)     Status: None   Collection Time: 12/16/21 12:01 AM  Result Value Ref Range   Troponin I (High Sensitivity) 7 <18 ng/L    Comment: (NOTE) Elevated high sensitivity troponin I (hsTnI) values and significant  changes across serial measurements may suggest ACS but many other  chronic and acute conditions are known to elevate hsTnI results.  Refer to the "Links" section for chest pain algorithms and additional  guidance. Performed at [facility], [address], [place], [place] [address]     Chemistries  Recent Labs  Lab 12/15/21 2215  NA 133*  K 3.7  CL 99  CO2 26  GLUCOSE 103*  BUN 14  CREATININE 1.03*  CALCIUM 8.0*  AST 34  ALT 15  ALKPHOS 103  BILITOT 1.8*    ------------------------------------------------------------------------------------------------------------------  ------------------------------------------------------------------------------------------------------------------ GFR: Estimated Creatinine Clearance: 83.6 mL/min (A) (by C-G formula based on SCr of 1.03 mg/dL (H)). Liver Function Tests: Recent Labs  Lab 12/15/21 2215  AST 34  ALT 15  ALKPHOS 103  BILITOT 1.8*  PROT 5.1*  ALBUMIN 1.9*   No  results for input(s): LIPASE, AMYLASE in the last 168 hours. No results for input(s): AMMONIA in the last 168 hours. Coagulation Profile: Recent Labs  Lab 12/15/21 2215  INR 1.5*   Cardiac Enzymes: No results for input(s): CKTOTAL, CKMB, CKMBINDEX, TROPONINI in the last 168 hours. BNP (last 3 results) No results for input(s): PROBNP in the last 8760 hours. HbA1C: No results for input(s): HGBA1C in the last 72 hours. CBG: No results for input(s): GLUCAP in the last 168 hours. Lipid Profile: No results for input(s): CHOL, HDL, LDLCALC, TRIG, CHOLHDL, LDLDIRECT in the last 72 hours. Thyroid Function Tests: No results for input(s): TSH, T4TOTAL, FREET4, T3FREE, THYROIDAB in the last 72 hours. Anemia Panel: No results for input(s): VITAMINB12, FOLATE, FERRITIN, TIBC, IRON, RETICCTPCT in the last 72 hours.  --------------------------------------------------------------------------------------------------------------- Urine analysis: No results found for: COLORURINE, APPEARANCEUR, LABSPEC, PHURINE, GLUCOSEU, HGBUR, BILIRUBINUR, KETONESUR, PROTEINUR, UROBILINOGEN, NITRITE, LEUKOCYTESUR    Imaging Results:    CT PELVIS W CONTRAST  Result Date: 12/16/2021 CLINICAL DATA:  Soft tissue infection EXAM: CT PELVIS WITH CONTRAST TECHNIQUE: Multidetector CT imaging of the pelvis was performed using the standard protocol following the bolus administration of intravenous contrast. CONTRAST:  174mL OMNIPAQUE IOHEXOL 300 MG/ML  SOLN  COMPARISON:  None. FINDINGS: Urinary Tract:  No abnormality visualized. Bowel: Unremarkable visualized pelvic bowel loops. There is large volume ascites present within the pelvis. Vascular/Lymphatic: A massive varix is seen extending from the umbilicus to the left common iliac vein, likely representing a portosystemic collateral in the setting of portal venous hypertension. No pathologic adenopathy within the abdomen and pelvis. Reproductive: Involuted fibroids noted within the uterus. The pelvic organs are otherwise unremarkable. Other: There is extensive diffuse subcutaneous body wall noted within the visualized abdominal wall and flanks bilaterally. Musculoskeletal: No acute bone abnormality. No lytic or blastic bone lesion. IMPRESSION: Marked diffuse subcutaneous body wall edema. Large volume ascites. Massive varix extending from the umbilicus the left common iliac vein. Together, the findings are most in keeping with changes of portal venous hypertension. Electronically Signed   By: Fidela Salisbury M.D.   On: 12/16/2021 02:16   DG Chest Port 1 View  Result Date: 12/15/2021 CLINICAL DATA:  Fever and lower extremity swelling. EXAM: PORTABLE CHEST 1 VIEW COMPARISON:  November 10, 2021 FINDINGS: Low lung volumes are seen. Mild to moderate severity areas of atelectasis and/or infiltrate are seen within the bilateral lung bases. There is a small left pleural effusion. No pneumothorax is identified. The heart size and mediastinal contours are within normal limits. Degenerative changes are noted throughout the thoracic spine. IMPRESSION: 1. Low lung volumes with mild to moderate severity bibasilar atelectasis and/or infiltrate. 2. Small left pleural effusion. Electronically Signed   By: Virgina Norfolk M.D.   On: 12/15/2021 22:47   CT EXTREM LOWER W CM BIL  Result Date: 12/16/2021 CLINICAL DATA:  Soft tissue infection of the lower extremities bilaterally EXAM: CT OF THE LOWER BILATERAL EXTREMITY WITH  CONTRAST TECHNIQUE: Multidetector CT imaging of the lower bilateral extremity was performed with axial images obtained from the acetabular roofs through the proximal tibial metadiaphysis following intravenous contrast administration. COMPARISON:  None. CONTRAST:  169mL OMNIPAQUE IOHEXOL 300 MG/ML  SOLN FINDINGS: Bones/Joint/Cartilage Normal alignment. No acute fracture or dislocation. No osseous erosion or abnormal periosteal reaction. 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 Ligaments Suboptimally assessed by CT. Muscles and Tendons Unremarkable Soft tissues There is marked diffuse subcutaneous edema involving the lower extremities bilaterally circumferentially. Edematous changes  extend into the inter fascial planes of the anterior, posterior, and medial muscular compartments of the thighs bilaterally. No discrete drainable fluid collection identified. Small bilateral knee effusions are present. Extensive subcutaneous edema is noted within the visualized pannus bilaterally. Large volume ascites is present within the visualized pelvis. IMPRESSION: Extensive subcutaneous edema involving the lower extremities bilaterally and pannus. Infiltrative changes extend into the muscular compartments of the thighs bilaterally. No discrete drainable fluid collection is identified, however. Small bilateral knee effusions. Large volume ascites. Electronically Signed   By: Fidela Salisbury M.D.   On: 12/16/2021 02:12    My personal review of EKG: Rhythm sinus tachycardia with rate of 119/min, QTc 511,no Acute ST changes   Assessment & Plan:    Principal Problem:   Sepsis ([place])   Sepsis Sepsis secondary to cellulitis SIRS criteria heart rate 118, respiratory rate 27, blood pressure 84/34, white blood cell count 15.9 Most likely source of infection is cellulitis Life threatening organ dysfunction 2/2 infection is evidenced by: Lactic acidosis 2.3 Antibiotics started vancomycin and  cefepime, continue vancomycin Fluids given prior to admission 3 L bolus Patient has borderline criteria for pressors, refusing central line, attempt albumin infusion first Sepsis order set utilized Monitor this patient on stepdown level of care Blood culture urine pending Possible infiltrate on chest x-ray less likely-more likely atelectasis Acute metabolic encephalopathy Most likely secondary to sepsis And history of cirrhosis, considering ammonia elevation as well Check ammonia level Continue treatment as above Continue to monitor Hypoalbuminemia Likely related to history of cirrhosis Encourage nutrient dense p.o. intake Albumin infusion in the ED Continue to monitor Ascites Secondary to liver failure Albumin 1.9 Albumin infusion given in the ED Continue to monitor    DVT Prophylaxis-   Heparin - SCDs   AM Labs Ordered, also please review Full Orders  Family Communication: No family at bedside  Code Status: Full  Admission status: Inpatient :The appropriate admission status for this patient is INPATIENT. Inpatient status is judged to be reasonable and necessary in order to provide the required intensity of service to ensure the patient's safety. The patient's presenting symptoms, physical exam findings, and initial radiographic and laboratory data in the context of their chronic comorbidities is felt to place them at high risk for further clinical deterioration. Furthermore, it is not anticipated that the patient will be medically stable for discharge from the hospital within 2 midnights of admission. The following factors support the admission status of inpatient.     The patient's presenting symptoms include fever and confusion. The worrisome physical exam findings include SIRS criteria. The initial radiographic and laboratory data are worrisome because of ascites. The chronic co-morbidities include cirrhosis.       * I certify that at the point of admission it is my  clinical judgment that the patient will require inpatient hospital care spanning beyond 2 midnights from the point of admission due to high intensity of service, high risk for further deterioration and high frequency of surveillance required.*  Disposition: Anticipated Discharge date 48 hours discharge to SNF  Time spent in minutes : [place] DO

## 2021-12-16 NOTE — ED Notes (Signed)
Pt in bed with eyes closed, resps even and unlabored, pt hypotensive on monitor. Md notified.

## 2021-12-16 NOTE — ED Provider Notes (Signed)
[facility] Emergency Department Provider Note MRN:  [id number]  Arrival date & time: 12/16/21     Chief Complaint   Leg Swelling   History of Present Illness   Kristie Brooks is a 55 y.o. year-old female with a history of lymphedema presenting to the ED with chief complaint of leg swelling.  Worsening pain and swelling and weeping of her bilateral upper legs over the past 2 days.  Some altered mental status this evening at care facility, also with reported fever of 100.4.  Sent here for evaluation.  Patient seems confused.  I was unable to obtain an accurate HPI, PMH, or ROS due to the patient's altered mental status.  Level 5 caveat.  Review of Systems  Positive for leg swelling, fever, altered mental status.  Patient's Health History    Past Medical History:  Diagnosis Date   Basal cell carcinoma 05/18/2021   nod-mid forehead ([facility])    History reviewed. No pertinent surgical history.  History reviewed. No pertinent family history.  Social History   Socioeconomic History   Marital status: Married    Spouse name: Not on file   Number of children: Not on file   Years of education: Not on file   Highest education level: Not on file  Occupational History   Not on file  Tobacco Use   Smoking status: Never   Smokeless tobacco: Never  Vaping Use   Vaping Use: Never used  Substance and Sexual Activity   Alcohol use: Never   Drug use: Never   Sexual activity: Not on file  Other Topics Concern   Not on file  Social History Narrative   Not on file   Social Determinants of Health   Financial Resource Strain: Not on file  Food Insecurity: Not on file  Transportation Needs: Not on file  Physical Activity: Not on file  Stress: Not on file  Social Connections: Not on file  Intimate Partner Violence: Not on file     Physical Exam   Vitals:   12/16/21 0300 12/16/21 0330  BP: (!) 85/43 (!) 84/34  Pulse: (!) 102 100  Resp: 18 18   Temp:    SpO2: 96% 97%    CONSTITUTIONAL: Chronically ill-appearing, NAD NEURO:  Alert and oriented x 3, no focal deficits EYES:  eyes equal and reactive ENT/NECK:  no LAD, no JVD CARDIO: Regular rate, well-perfused, normal S1 and S2 PULM:  CTAB no wheezing or rhonchi GI/GU:  normal bowel sounds, non-distended, non-tender MSK/SPINE:  No gross deformities, no edema SKIN: Lymphedema to bilateral lower extremities, no obvious erythema or crepitus or increased warmth, diffuse weeping and blistering of the skin to the medial thighs PSYCH:  Appropriate speech and behavior  *Additional and/or pertinent findings included in MDM below  Diagnostic and Interventional Summary    EKG Interpretation  Date/Time:  Wednesday December 15 2021 22:13:47 EST Ventricular Rate:  119 PR Interval:  114 QRS Duration: 130 QT Interval:  363 QTC Calculation: 511 R Axis:   149 Text Interpretation: Sinus tachycardia Nonspecific intraventricular conduction delay Abnormal lateral Q waves Poor data quality No previous tracing Confirmed by Dorie Rank [phone]) on 12/15/2021 10:31:52 PM       Labs Reviewed  LACTIC ACID, PLASMA - Abnormal; Notable for the following components:      Result Value   Lactic Acid, Venous 2.3 (*)    All other components within normal limits  LACTIC ACID, PLASMA - Abnormal; Notable for the following components:  Lactic Acid, Venous 2.0 (*)    All other components within normal limits  COMPREHENSIVE METABOLIC PANEL - Abnormal; Notable for the following components:   Sodium 133 (*)    Glucose, Bld 103 (*)    Creatinine, Ser 1.03 (*)    Calcium 8.0 (*)    Total Protein 5.1 (*)    Albumin 1.9 (*)    Total Bilirubin 1.8 (*)    All other components within normal limits  CBC WITH DIFFERENTIAL/PLATELET - Abnormal; Notable for the following components:   WBC 15.9 (*)    RBC 3.78 (*)    Hemoglobin 11.3 (*)    HCT 34.9 (*)    RDW 17.4 (*)    Platelets 126 (*)    Neutro Abs 14.2  (*)    Abs Immature Granulocytes 0.15 (*)    All other components within normal limits  PROTIME-INR - Abnormal; Notable for the following components:   Prothrombin Time 17.6 (*)    INR 1.5 (*)    All other components within normal limits  BRAIN NATRIURETIC PEPTIDE - Abnormal; Notable for the following components:   B Natriuretic Peptide 305.0 (*)    All other components within normal limits  CULTURE, BLOOD (ROUTINE X 2)  CULTURE, BLOOD (ROUTINE X 2)  RESP PANEL BY RT-PCR (FLU A&B, COVID) ARPGX2  URINE CULTURE  APTT  HCG, QUANTITATIVE, PREGNANCY  URINALYSIS, ROUTINE W REFLEX MICROSCOPIC  AMMONIA  TROPONIN I (HIGH SENSITIVITY)  TROPONIN I (HIGH SENSITIVITY)    CT EXTREM LOWER W CM BIL  Final Result    CT PELVIS W CONTRAST  Final Result    DG Chest Port 1 View  Final Result      Medications  albumin human 25 % solution 25 g (has no administration in time range)  sodium chloride 0.9 % bolus 1,000 mL (0 mLs Intravenous Stopped 12/16/21 0143)  ceFEPIme (MAXIPIME) 2 g in sodium chloride 0.9 % 100 mL IVPB (0 g Intravenous Stopped 12/16/21 0056)  fentaNYL (SUBLIMAZE) injection 50 mcg (50 mcg Intravenous Given 12/16/21 0006)  vancomycin (VANCOREADY) IVPB 2000 mg/400 mL (0 mg Intravenous Stopped 12/16/21 0346)  iohexol (OMNIPAQUE) 300 MG/ML solution 100 mL (100 mLs Intravenous Contrast Given 12/16/21 0112)  lactated ringers bolus 2,000 mL (2,000 mLs Intravenous New Bag/Given 12/16/21 0144)     Procedures  /  Critical Care .Critical Care Performed by: Maudie Flakes, MD Authorized by: Maudie Flakes, MD   Critical care provider statement:    Critical care time (minutes):  85   Critical care was necessary to treat or prevent imminent or life-threatening deterioration of the following conditions:  Sepsis and shock   Critical care was time spent personally by me on the following activities:  Development of treatment plan with patient or surrogate, discussions with consultants,  evaluation of patient's response to treatment, examination of patient, ordering and review of laboratory studies, ordering and review of radiographic studies, ordering and performing treatments and interventions, pulse oximetry, re-evaluation of patient's condition and review of old charts  ED Course and Medical Decision Making  I have reviewed the triage vital signs, the nursing notes, and pertinent available records from the EMR.  Listed above are laboratory and imaging tests that I personally ordered, reviewed, and interpreted and then considered in my medical decision making (see below for details).  Suspect cellulitis or possible early sepsis of soft tissue origin with source being the legs.  Patient is tachycardic, soft blood pressure, report of fever.  Leukocytosis  with left shift.  Mildly elevated lactate.  Code sepsis initiated, starting Vanco, cefepime based on her recent admission notes back in October.  Will admit to medicine.     Patient demonstrating hypotension.  Fluid bolus increased to 3 L.  Patient still with persistent blood pressures in the 53G to 64Q systolic despite 2 to 3 L crystalloid.  Patient seems well-perfused, warm, strong peripheral pulses, conversant.  Still there is concern for possible septic shock.  Patient also has very low albumin and this could be contributing to her hypotension and her leg swelling.  Patient is not interested in a central line at this time, has had them before and does not like them.  We will hold off for now and trial albumin and admit to the ICU.  Barth Kirks. Sedonia Small, [place] [email or web address]  Final Clinical Impressions(s) / ED Diagnoses     ICD-10-CM   1. Shock ([place])  R57.9       ED Discharge Orders     None        Discharge Instructions Discussed with and Provided to Patient:   Discharge Instructions   None       Maudie Flakes, MD 12/16/21 [phone]

## 2021-12-16 NOTE — Progress Notes (Signed)
Pharmacy Antibiotic Note  Kristie Brooks is a 55 y.o. female admitted on 12/15/2021 with cellulitis.  Pharmacy has been consulted for vancomycin dosing.  Plan: Vancomycin 2000mg  x1 in ED then 750mg  IV Q12H. Goal AUC 400-550.  Expected AUC 450.  SCr 1.03.   Height: 5\' 5"  (165.1 cm) Weight: 131.5 kg (290 lb) IBW/kg (Calculated) : 57  Temp (24hrs), Avg:99.2 F (37.3 C), Min:99.2 F (37.3 C), Max:99.2 F (37.3 C)  Recent Labs  Lab 12/15/21 2215 12/16/21 0001  WBC 15.9*  --   CREATININE 1.03*  --   LATICACIDVEN 2.3* 2.0*    Estimated Creatinine Clearance: 83.6 mL/min (A) (by C-G formula based on SCr of 1.03 mg/dL (H)).    Allergies  Allergen Reactions   Zosyn [Piperacillin Sod-Tazobactam So] Other (See Comments)    Blood count dropped     Thank you for allowing pharmacy to be a part of this patients care.  Wynona Neat, PharmD, BCPS  12/16/2021 7:23 AM

## 2021-12-16 NOTE — Progress Notes (Signed)
Per Audelia Acton, RN PICC order to be canceled as patient is not agreeable to any treatments at this time including IV starts.

## 2021-12-16 NOTE — ED Notes (Signed)
Pt blood awake, her blood pressure improved to 94/49, md updated, pt refusing second iv placement at this time.

## 2021-12-16 NOTE — ED Notes (Signed)
Increased O2 via [place] to 4.5L, pt in bed, pt awake and answering questions appropriately.

## 2021-12-16 NOTE — Progress Notes (Signed)
Patient seen and examined.  Admitted after midnight secondary to sepsis In the setting of cellulitis.  Patient met criteria with elevated heart rate, increased respiratory rate, elevated WBCs, low blood pressure (difficult to treat in a patient with underlying insulin stenosis and obesity) and elevated lactic acid.  Blood pressure has remained soft; improvement in heart rate appreciated.  Currently not febrile.  Normal ammonia level.  Patient oriented to person, place and date.  Please refer to H&P written by Dr. Clearence Ped for further info/details on admission.  Plan: -Continue current antibiotics -Repeat albumin infusion -Start treatment with midodrine -Closely follow renal function and electrolytes -Overall prognosis is guarded; acute and extensive comorbidities and high risk for decompensation palliative care will be consulted for clarifying goals of cares and advance directives. -poor IV access; will place orders for PICC line.  Barton Dubois MD [phone]

## 2021-12-16 NOTE — Progress Notes (Signed)
Patient initially told this RN that she did not want any additional IV lines. After another discussion now, patient states she was under the impression that the PICC line was going to be in her neck and she did not want any lines in her neck. Explained that the procedure was going to be for a central line in her arm that would allow [place] to administer IV medications and draw blood. Patient stated she changed her mind, and would now be agreeable to a PICC line being placed. Spoke with vascular access team RN and plan for placement tomorrow.

## 2021-12-17 ENCOUNTER — Inpatient Hospital Stay (HOSPITAL_COMMUNITY): Payer: Managed Care, Other (non HMO)

## 2021-12-17 DIAGNOSIS — L03119 Cellulitis of unspecified part of limb: Secondary | ICD-10-CM

## 2021-12-17 DIAGNOSIS — A419 Sepsis, unspecified organism: Secondary | ICD-10-CM | POA: Diagnosis not present

## 2021-12-17 DIAGNOSIS — R579 Shock, unspecified: Secondary | ICD-10-CM

## 2021-12-17 DIAGNOSIS — K7031 Alcoholic cirrhosis of liver with ascites: Secondary | ICD-10-CM

## 2021-12-17 LAB — COMPREHENSIVE METABOLIC PANEL
ALT: 11 U/L (ref 0–44)
AST: 26 U/L (ref 15–41)
Albumin: 2.7 g/dL — ABNORMAL LOW (ref 3.5–5.0)
Alkaline Phosphatase: 68 U/L (ref 38–126)
Anion gap: 9 (ref 5–15)
BUN: 17 mg/dL (ref 6–20)
CO2: 24 mmol/L (ref 22–32)
Calcium: 8 mg/dL — ABNORMAL LOW (ref 8.9–10.3)
Chloride: 102 mmol/L (ref 98–111)
Creatinine, Ser: 1.03 mg/dL — ABNORMAL HIGH (ref 0.44–1.00)
GFR, Estimated: >60 mL/min (ref >60)
Glucose, Bld: 93 mg/dL (ref 70–99)
Potassium: 3.1 mmol/L — ABNORMAL LOW (ref 3.5–5.1)
Sodium: 135 mmol/L (ref 135–145)
Total Bilirubin: 2 mg/dL — ABNORMAL HIGH (ref 0.3–1.2)
Total Protein: 4.8 g/dL — ABNORMAL LOW (ref 6.5–8.1)

## 2021-12-17 LAB — URINE CULTURE

## 2021-12-17 LAB — CBC WITH DIFFERENTIAL/PLATELET
Abs Immature Granulocytes: 0.13 10*3/uL — ABNORMAL HIGH (ref 0.00–0.07)
Basophils Absolute: 0 10*3/uL (ref 0.0–0.1)
Basophils Relative: 0 %
Eosinophils Absolute: 0.3 10*3/uL (ref 0.0–0.5)
Eosinophils Relative: 3 %
HCT: 26 % — ABNORMAL LOW (ref 36.0–46.0)
Hemoglobin: 8.8 g/dL — ABNORMAL LOW (ref 12.0–15.0)
Immature Granulocytes: 1 %
Lymphocytes Relative: 8 %
Lymphs Abs: 0.8 10*3/uL (ref 0.7–4.0)
MCH: 31.3 pg (ref 26.0–34.0)
MCHC: 33.8 g/dL (ref 30.0–36.0)
MCV: 92.5 fL (ref 80.0–100.0)
Monocytes Absolute: 0.9 10*3/uL (ref 0.1–1.0)
Monocytes Relative: 9 %
Neutro Abs: 7.6 10*3/uL (ref 1.7–7.7)
Neutrophils Relative %: 79 %
Platelets: 105 10*3/uL — ABNORMAL LOW (ref 150–400)
RBC: 2.81 MIL/uL — ABNORMAL LOW (ref 3.87–5.11)
RDW: 17.6 % — ABNORMAL HIGH (ref 11.5–15.5)
WBC: 9.8 10*3/uL (ref 4.0–10.5)
nRBC: 0 % (ref 0.0–0.2)

## 2021-12-17 LAB — TSH: TSH: 9.445 u[IU]/mL — ABNORMAL HIGH (ref 0.350–4.500)

## 2021-12-17 LAB — LACTIC ACID, PLASMA: Lactic Acid, Venous: 1.3 mmol/L (ref 0.5–1.9)

## 2021-12-17 LAB — MAGNESIUM: Magnesium: 1.9 mg/dL (ref 1.7–2.4)

## 2021-12-17 MED ORDER — POTASSIUM CHLORIDE CRYS ER 20 MEQ PO TBCR
40.0000 meq | EXTENDED_RELEASE_TABLET | ORAL | Status: AC
  Administered 2021-12-17 (×2): 40 meq via ORAL
  Filled 2021-12-17 (×2): qty 2

## 2021-12-17 MED ORDER — SODIUM CHLORIDE 0.9% FLUSH
10.0000 mL | Freq: Two times a day (BID) | INTRAVENOUS | Status: DC
  Administered 2021-12-17 – 2021-12-26 (×17): 10 mL

## 2021-12-17 MED ORDER — LEVOTHYROXINE SODIUM 100 MCG PO TABS
100.0000 ug | ORAL_TABLET | Freq: Every day | ORAL | Status: DC
  Administered 2021-12-18 – 2021-12-26 (×7): 100 ug via ORAL
  Filled 2021-12-17 (×9): qty 1

## 2021-12-17 MED ORDER — SODIUM CHLORIDE 0.9% FLUSH: 10.0000 mL | INTRAVENOUS | Status: DC | PRN

## 2021-12-17 MED ORDER — NOREPINEPHRINE 4 MG/250ML-% IV SOLN
0.0000 ug/min | INTRAVENOUS | Status: DC
  Administered 2021-12-17: 20:00:00 4 ug/min via INTRAVENOUS
  Administered 2021-12-17: 04:00:00 2 ug/min via INTRAVENOUS
  Filled 2021-12-17 (×2): qty 250

## 2021-12-17 NOTE — Consult Note (Signed)
[place] Nurse wound consult note  Consultation completed by use of Elink technology and with assistance from bedside nurse/clinical staff  Reason for Consult: bilateral leg ulcers Wound type: Venous ulcerations; long term history x 3+ years. Followed at the [facility] wound care clinic by Dr. Julianne Rice. She cares for them at home at one point. Per patient Dr. Julianne Rice "had her go to rehab" to get her legs well.  "Keep me off my feet" Pressure Injury POA: NA Measurement: Right lateral ; 13cm x 3cm x 0.2cm  Left lateral ; 15cm x 5cm x 0.2cm  Wound bed: clean, pale, pink, 100% granulation tissue/ hypergranulation tissue on the LLE Drainage (amount, consistency, odor) minimal; serosanguinous  Periwound: venous dermatitis and LE edema  Dressing procedure/placement/frequency:   Cut to fit calcium alginate and place over wounds, top with ABD pad. Secure with kerlix and use 4" ACE wraps from toes to knees. Change daily.  Elevate legs as much as possible.   Discussed POC with patient and bedside nurse.  Re consult if needed, will not follow at this time. Thanks  Natahlia Hoggard [employer], RN,CWOCN, CNS, [place] [phone])

## 2021-12-17 NOTE — Progress Notes (Signed)
° °  Paracentesis ordered  CT yesterday shows small amount ascites  Per Dr Dyann Kief-- pt is uncomfortable with distension  Shows no signs of infection; afeb; NT No labs ordered for para  BP still 80s/40s this am  I had discussed para with Dr Thornton Papas yesterday and today with Dr Ardeen Garland I have also discussed with Dr Dyann Kief  Plan: HOLD for now If BP is consistently over 883 systolic in next few days-  could consider paracentesis in [place] if MD feels appropriate

## 2021-12-17 NOTE — Progress Notes (Signed)
PROGRESS NOTE    Kristie Brooks  MGQ:[id number] DOB: [birth year] DOA: 12/15/2021 PCP: Keane Police, MD    Chief Complaint  Patient presents with   Leg Swelling    Brief Narrative:  As per H&P written by Dr. Clearence Ped on 12/16/21  Kristie Brooks  is a 55 y.o. female, with history of cirrhosis, presents ED for chief complaint of what she says is fluid overload.  What was reported by EMS states that she was sent in for fever and confusion.  Does report that her husband said she was confused that she has been thinks that she was.  She reports that she had fluid overload in her legs and thighs for 3 or 4 days.  She reports they have been weeping for 3 or 4 days.  She is also had erythema for 3 or 4 days.  She reports that all the symptoms started the same time.  She had fevers but she is unsure how high.  She denies any confusion.  She denies any cough, dysuria, chest pain, and vomiting.  She does report nausea intermittently since September.  She denies any diarrhea.  Patient reports no change in her normal abdominal pain.  She has had abdominal pain for a long time per her report.  Patient reports no purulent drainage from her bilateral legs.  Bilateral legs are wrapped by the ED at the time of my exam.  She does not have a history of an echo on file with our system.  Patient has no further complaints at this time.   Patient does not smoke, does not drink, does not use illicit drugs.  Patient is vaccinated for COVID.  Patient is full code.  Patient used to be a Marine scientist.   In the ED Temp 99.2, heart rate 100-118, respiratory 17-27, blood pressure at admission 84/34 but as low as systolic in the 26Z Troponin 6, 7 Elevated white blood cell count 15.9, hemoglobin 11.3, platelets 126 Chemistry panel is unremarkable Albumin 1.9 Negative respiratory panel Blood culture pending CT pelvis shows marked diffuse subcu body wall edema with large volume ascites.  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 Together these findings are most in keeping with changes of portal venous hypertension Chest x-ray shows low lung volumes with mild to moderate severity bibasilar atelectasis and/or infiltrate with small left pleural effusion Patient was given cefepime, vancomycin however 3 L bolus Cintron was discussed with patient and patient declined.  She reports she still is 1 to be full code, but does not want a central line at this time.  Options for albumin discussed with patient.  Patient accepts albumin. Admission requested for possible sepsis secondary cellulitis   Assessment & Plan: 1-severe sepsis secondary to cellulitis -Bilateral lower extremity cellulitis appreciated -Patient met criteria at time of admission with tachycardia, elevated respiratory rate, elevated white blood cell count, low blood pressure, lactic acid 2.3 and also on presentation confusion as part of organ dysfunction. -Currently afebrile, mentation has significantly improved and patient is overall feeling better. -Continue current antibiotics and follow culture results. -Patient denies chest pain, no nausea, no vomiting. -Ongoing lower extremity discomfort reported; overall erythematous changes and edema improved. -Continue to follow recommendations by wound care service.  2-acute metabolic encephalopathy -In the setting of sepsis -Treatment as mentioned above -Ammonia level within normal limit -Minimize the use of medication that can alter sedation. -Provide constant reorientation.  3-history of cirrhosis with ascites/hypoalbuminemia -Currently no significant fluid to be drained  with paracentesis (as per IR recommendations); continue low-sodium diet and when allowed by blood pressure resume the use of diuretics. -Follow daily weights-strict I's and O's. -Patient has received albumin infusion with some improvement in blood pressure.  4-dermatitis lower extremity edema -Continue  dressing care and wound care recommendations. -Continue current IV antibiotics.  5-hypothyroidism -TSH elevated -Synthroid dose has been adjusted we will follow response.  6-class III obesity -Portion control and low calorie diet recommended. -Body mass index is 43.95 kg/m.  7-hypertension -Low blood pressure running at baseline in the setting of cirrhosis: Acute infection contributing and playing a role in her presentation. -Continue to wean off Levophed continue midodrine 3 times a day.   DVT prophylaxis: Heparin Code Status: Full code Family Communication: No family at bedside. Disposition:   Status is: Inpatient    Consultants:  IR  Procedures:  See below for x-ray reports.  Antimicrobials:  Vancomycin   Subjective: No fever, no chest pain, no nausea, no.  Continues to require 3 L of oxygen supplementation.  Reports feeling better.  Still chronic low blood pressure.  Objective: Vitals:   12/17/21 0300 12/17/21 0440 12/17/21 0500 12/17/21 0808  BP: (!) 83/45     Pulse: 76     Resp: 15     Temp:  97.6 F (36.4 C)  97.8 F (36.6 C)  TempSrc:  Oral  Oral  SpO2: 96%     Weight:   119.8 kg   Height:        Intake/Output Summary (Last 24 hours) at 12/17/2021 0839 Last data filed at 12/17/2021 0510 Gross per 24 hour  Intake 920.53 ml  Output 500 ml  Net 420.53 ml   Filed Weights   12/15/21 2119 12/17/21 0500  Weight: 131.5 kg 119.8 kg    Examination:  General exam: Chronically ill in appearance; morbidly obese.  Positive signs of anasarca.  Currently afebrile. Respiratory system: Clear to auscultation. Respiratory effort normal.  3 L nasal cannula in place; no using accessory muscles.  Decreased breath sounds at the bases. Cardiovascular system: Rate controlled, no rubs, no gallops, no JVD. Gastrointestinal system: Abdomen is obese, positive fluid wave and increased abdominal girth.  Positive bowel sounds.  No guarding.  Patient denies abdominal pain  on palpation. Central nervous system: Alert and oriented. No focal neurological deficits.  Moving 4 limbs spontaneously. Extremities: No cyanosis or clubbing; bilateral lower extremity extending up to her thighs with 3+ edema and no active weeping process.  Clean dressing changes Psychiatry: Judgement and insight appear normal. Mood & affect appropriate.     Data Reviewed: I have personally reviewed following labs and imaging studies  CBC: Recent Labs  Lab 12/15/21 2215 12/17/21 0623  WBC 15.9* 9.8  NEUTROABS 14.2* 7.6  HGB 11.3* 8.8*  HCT 34.9* 26.0*  MCV 92.3 92.5  PLT 126* 105*    Basic Metabolic Panel: Recent Labs  Lab 12/15/21 2215 12/16/21 0001 12/16/21 2002 12/17/21 0443  NA 133*  --  134* 135  K 3.7 3.8 3.4* 3.1*  CL 99  --  101 102  CO2 26  --  23 24  GLUCOSE 103*  --  157* 93  BUN 14  --  16 17  CREATININE 1.03*  --  1.05* 1.03*  CALCIUM 8.0*  --  7.9* 8.0*  MG  --   --   --  1.9    GFR: Estimated Creatinine Clearance: 79 mL/min (A) (by C-G formula based on SCr of 1.03 mg/dL (H)).  Liver Function Tests: Recent Labs  Lab 12/15/21 2215 12/16/21 2002 12/17/21 0443  AST 34 27 26  ALT 15 12 11   ALKPHOS 103 63 68  BILITOT 1.8* 1.7* 2.0*  PROT 5.1* 4.9* 4.8*  ALBUMIN 1.9* 2.9* 2.7*    CBG: No results for input(s): GLUCAP in the last 168 hours.   Recent Results (from the past 240 hour(s))  Resp Panel by RT-PCR (Flu A&B, Covid) Nasopharyngeal Swab     Status: None   Collection Time: 12/15/21  9:33 PM   Specimen: Nasopharyngeal Swab; Nasopharyngeal(NP) swabs in vial transport medium  Result Value Ref Range Status   SARS Coronavirus 2 by RT PCR NEGATIVE NEGATIVE Final    Comment: (NOTE) SARS-CoV-2 target nucleic acids are NOT DETECTED.  The SARS-CoV-2 RNA is generally detectable in upper respiratory specimens during the acute phase of infection. The lowest concentration of SARS-CoV-2 viral copies this assay can detect is 138 copies/mL. A  negative result does not preclude SARS-Cov-2 infection and should not be used as the sole basis for treatment or other patient management decisions. A negative result may occur with  improper specimen collection/handling, submission of specimen other than nasopharyngeal swab, presence of viral mutation(s) within the areas targeted by this assay, and inadequate number of viral copies(<138 copies/mL). A negative result must be combined with clinical observations, patient history, and epidemiological information. The expected result is Negative.  Fact Sheet for Patients:  EntrepreneurPulse.com.au  Fact Sheet for Healthcare Providers:  IncredibleEmployment.be  This test is no t yet approved or cleared by the [place] FDA and  has been authorized for detection and/or diagnosis of SARS-CoV-2 by FDA under an Emergency Use Authorization (EUA). This EUA will remain  in effect (meaning this test can be used) for the duration of the COVID-19 declaration under Section 564(b)(1) of the Act, 21 U.S.C.section 360bbb-3(b)(1), unless the authorization is terminated  or revoked sooner.       Influenza A by PCR NEGATIVE NEGATIVE Final   Influenza B by PCR NEGATIVE NEGATIVE Final    Comment: (NOTE) The Xpert Xpress SARS-CoV-2/FLU/RSV plus assay is intended as an aid in the diagnosis of influenza from Nasopharyngeal swab specimens and should not be used as a sole basis for treatment. Nasal washings and aspirates are unacceptable for Xpert Xpress SARS-CoV-2/FLU/RSV testing.  Fact Sheet for Patients: EntrepreneurPulse.com.au  Fact Sheet for Healthcare Providers: IncredibleEmployment.be  This test is not yet approved or cleared by the [place] FDA and has been authorized for detection and/or diagnosis of SARS-CoV-2 by FDA under an Emergency Use Authorization (EUA). This EUA will remain in effect (meaning this test can  be used) for the duration of the COVID-19 declaration under Section 564(b)(1) of the Act, 21 U.S.C. section 360bbb-3(b)(1), unless the authorization is terminated or revoked.  Performed at [facility], [address], [place], [place] [address]   Blood Culture (routine x 2)     Status: None (Preliminary result)   Collection Time: 12/15/21 10:15 PM   Specimen: BLOOD RIGHT ARM  Result Value Ref Range Status   Specimen Description BLOOD RIGHT ARM  Final   Special Requests   Final    BOTTLES DRAWN AEROBIC AND ANAEROBIC Blood Culture results may not be optimal due to an inadequate volume of blood received in culture bottles   Culture   Final    NO GROWTH 2 DAYS Performed at [facility], [address], [place], [place] [address]    Report Status PENDING  Incomplete  Blood  Culture (routine x 2)     Status: None (Preliminary result)   Collection Time: 12/15/21 10:15 PM   Specimen: Right Antecubital; Blood  Result Value Ref Range Status   Specimen Description RIGHT ANTECUBITAL  Final   Special Requests   Final    BOTTLES DRAWN AEROBIC AND ANAEROBIC Blood Culture results may not be optimal due to an excessive volume of blood received in culture bottles   Culture   Final    NO GROWTH 2 DAYS Performed at [facility], [address], [place], [place] [address]    Report Status PENDING  Incomplete         Radiology Studies: CT PELVIS W CONTRAST  Result Date: 12/16/2021 CLINICAL DATA:  Soft tissue infection EXAM: CT PELVIS WITH CONTRAST TECHNIQUE: Multidetector CT imaging of the pelvis was performed using the standard protocol following the bolus administration of intravenous contrast. CONTRAST:  185m OMNIPAQUE IOHEXOL 300 MG/ML  SOLN COMPARISON:  None. FINDINGS: Urinary Tract:  No abnormality visualized. Bowel: Unremarkable visualized pelvic bowel loops. There is large volume ascites present within the pelvis. Vascular/Lymphatic: A massive varix is seen extending from the umbilicus to  the left common iliac vein, likely representing a portosystemic collateral in the setting of portal venous hypertension. No pathologic adenopathy within the abdomen and pelvis. Reproductive: Involuted fibroids noted within the uterus. The pelvic organs are otherwise unremarkable. Other: There is extensive diffuse subcutaneous body wall noted within the visualized abdominal wall and flanks bilaterally. Musculoskeletal: No acute bone abnormality. No lytic or blastic bone lesion. IMPRESSION: Marked diffuse subcutaneous body wall edema. Large volume ascites. Massive varix extending from the umbilicus the left common iliac vein. Together, the findings are most in keeping with changes of portal venous hypertension. Electronically Signed   By: AFidela SalisburyM.D.   On: 12/16/2021 02:16   DG Chest Port 1 View  Result Date: 12/15/2021 CLINICAL DATA:  Fever and lower extremity swelling. EXAM: PORTABLE CHEST 1 VIEW COMPARISON:  November 10, 2021 FINDINGS: Low lung volumes are seen. Mild to moderate severity areas of atelectasis and/or infiltrate are seen within the bilateral lung bases. There is a small left pleural effusion. No pneumothorax is identified. The heart size and mediastinal contours are within normal limits. Degenerative changes are noted throughout the thoracic spine. IMPRESSION: 1. Low lung volumes with mild to moderate severity bibasilar atelectasis and/or infiltrate. 2. Small left pleural effusion. Electronically Signed   By: TVirgina NorfolkM.D.   On: 12/15/2021 22:47   ECHOCARDIOGRAM COMPLETE  Result Date: 12/16/2021    ECHOCARDIOGRAM REPORT   Patient Name:   RLEAH THORNBERRYDate of Exam: 12/16/2021 Medical Rec #:  [id number]          Height:       65.0 in Accession #:    [id number]         Weight:       290.0 lb Date of Birth:  106-Feb-1967          BSA:          2.317 m Patient Age:    89years            BP:           73/36 mmHg Patient Gender: F                   HR:           106 bpm.  Exam Location:  [facility] NaProcedure: 2D  Echo, Cardiac Doppler and Color Doppler Indications:    Edema  History:        Patient has no prior history of Echocardiogram examinations.                 Signs/Symptoms:Edema.  Sonographer:    Wenda Low Referring Phys: [id number] ASIA B Upton  Sonographer Comments: Patient is morbidly obese and no subcostal window. IMPRESSIONS  1. Left ventricular ejection fraction, by estimation, is 65 to 70%. The left ventricle has normal function. The left ventricle has no regional wall motion abnormalities. The left ventricular internal cavity size was mildly dilated. There is mild left ventricular hypertrophy. Left ventricular diastolic parameters were normal.  2. Right ventricular systolic function is normal. The right ventricular size is normal. There is normal pulmonary artery systolic pressure.  3. The mitral valve is normal in structure. Trivial mitral valve regurgitation.  4. The aortic valve is tricuspid. Aortic valve regurgitation is not visualized. Aortic valve sclerosis is present, with no evidence of aortic valve stenosis. FINDINGS  Left Ventricle: Left ventricular ejection fraction, by estimation, is 65 to 70%. The left ventricle has normal function. The left ventricle has no regional wall motion abnormalities. The left ventricular internal cavity size was mildly dilated. There is  mild left ventricular hypertrophy. Left ventricular diastolic parameters were normal. Right Ventricle: The right ventricular size is normal. Right vetricular wall thickness was not assessed. Right ventricular systolic function is normal. There is normal pulmonary artery systolic pressure. The tricuspid regurgitant velocity is 2.48 m/s, and with an assumed right atrial pressure of 8 mmHg, the estimated right ventricular systolic pressure is 05.6 mmHg. Left Atrium: Left atrial size was normal in size. Right Atrium: Right atrial size was normal in size. Pericardium: There is no evidence  of pericardial effusion. Mitral Valve: The mitral valve is normal in structure. Trivial mitral valve regurgitation. MV peak gradient, 7.2 mmHg. The mean mitral valve gradient is 4.0 mmHg. Tricuspid Valve: The tricuspid valve is normal in structure. Tricuspid valve regurgitation is trivial. Aortic Valve: The aortic valve is tricuspid. Aortic valve regurgitation is not visualized. Aortic valve sclerosis is present, with no evidence of aortic valve stenosis. Aortic valve mean gradient measures 8.0 mmHg. Aortic valve peak gradient measures 16.6 mmHg. Aortic valve area, by VTI measures 2.25 cm. Pulmonic Valve: The pulmonic valve was normal in structure. Pulmonic valve regurgitation is not visualized. Aorta: The aortic root is normal in size and structure. IAS/Shunts: No atrial level shunt detected by color flow Doppler.  LEFT VENTRICLE PLAX 2D LVIDd:         5.20 cm     Diastology LVIDs:         3.20 cm     LV e' medial:    13.40 cm/s LV PW:         1.10 cm     LV E/e' medial:  8.0 LV IVS:        1.00 cm     LV e' lateral:   16.80 cm/s LVOT diam:     2.00 cm     LV E/e' lateral: 6.4 LV SV:         100 LV SV Index:   43 LVOT Area:     3.14 cm  LV Volumes (MOD) LV vol d, MOD A2C: 75.0 ml LV vol d, MOD A4C: 69.1 ml LV vol s, MOD A2C: 32.3 ml LV vol s, MOD A4C: 27.0 ml LV SV MOD A2C:     42.7  ml LV SV MOD A4C:     69.1 ml LV SV MOD BP:      42.2 ml RIGHT VENTRICLE RV Basal diam:  3.30 cm RV Mid diam:    4.00 cm RV S prime:     19.20 cm/s TAPSE (M-mode): 3.1 cm LEFT ATRIUM             Index        RIGHT ATRIUM           Index LA diam:        4.50 cm 1.94 cm/m   RA Area:     17.50 cm LA Vol (A2C):   48.1 ml 20.76 ml/m  RA Volume:   41.20 ml  17.79 ml/m LA Vol (A4C):   92.3 ml 39.84 ml/m LA Biplane Vol: 71.3 ml 30.78 ml/m  AORTIC VALVE                     PULMONIC VALVE AV Area (Vmax):    2.39 cm      PV Vmax:       1.21 m/s AV Area (Vmean):   2.65 cm      PV Peak grad:  5.9 mmHg AV Area (VTI):     2.25 cm AV Vmax:            204.00 cm/s AV Vmean:          128.000 cm/s AV VTI:            0.444 m AV Peak Grad:      16.6 mmHg AV Mean Grad:      8.0 mmHg LVOT Vmax:         155.00 cm/s LVOT Vmean:        108.000 cm/s LVOT VTI:          0.318 m LVOT/AV VTI ratio: 0.72  AORTA Ao Root diam: 2.80 cm MITRAL VALVE                TRICUSPID VALVE MV Area (PHT): 3.13 cm     TR Peak grad:   24.6 mmHg MV Area VTI:   3.86 cm     TR Vmax:        248.00 cm/s MV Peak grad:  7.2 mmHg MV Mean grad:  4.0 mmHg     SHUNTS MV Vmax:       1.34 m/s     Systemic VTI:  0.32 m MV Vmean:      88.2 cm/s    Systemic Diam: 2.00 cm MV Decel Time: 242 msec MV E velocity: 107.00 cm/s MV A velocity: 89.40 cm/s MV E/A ratio:  1.20 Dorris Carnes MD Electronically signed by Dorris Carnes MD Signature Date/Time: 12/16/2021/3:38:01 PM    Final    CT EXTREM LOWER W CM BIL  Result Date: 12/16/2021 CLINICAL DATA:  Soft tissue infection of the lower extremities bilaterally EXAM: CT OF THE LOWER BILATERAL EXTREMITY WITH CONTRAST TECHNIQUE: Multidetector CT imaging of the lower bilateral extremity was performed with axial images obtained from the acetabular roofs through the proximal tibial metadiaphysis following intravenous contrast administration. COMPARISON:  None. CONTRAST:  159m OMNIPAQUE IOHEXOL 300 MG/ML  SOLN FINDINGS: Bones/Joint/Cartilage Normal alignment. No acute fracture or dislocation. No osseous erosion or abnormal periosteal reaction. 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 Ligaments Suboptimally assessed by CT. Muscles and Tendons Unremarkable Soft tissues There is marked diffuse subcutaneous edema involving the lower extremities bilaterally circumferentially. Edematous changes  extend into the inter fascial planes of the anterior, posterior, and medial muscular compartments of the thighs bilaterally. No discrete drainable fluid collection identified. Small bilateral knee effusions are present. Extensive subcutaneous  edema is noted within the visualized pannus bilaterally. Large volume ascites is present within the visualized pelvis. IMPRESSION: Extensive subcutaneous edema involving the lower extremities bilaterally and pannus. Infiltrative changes extend into the muscular compartments of the thighs bilaterally. No discrete drainable fluid collection is identified, however. Small bilateral knee effusions. Large volume ascites. Electronically Signed   By: Fidela Salisbury M.D.   On: 12/16/2021 02:12   [place] EKG SITE RITE  Result Date: 12/16/2021 If Site Rite image not attached, placement could not be confirmed due to current cardiac rhythm.       Scheduled Meds:  Chlorhexidine Gluconate Cloth  6 each Topical Daily   heparin  5,000 Units Subcutaneous Q8H   levothyroxine  100 mcg Oral Q0600   midodrine  5 mg Oral TID WC   potassium chloride  40 mEq Oral Q4H   Continuous Infusions:  norepinephrine (LEVOPHED) Adult infusion 3 mcg/min (12/17/21 0510)   vancomycin 750 mg (12/16/21 2355)     LOS: 1 day    CRITICAL CARE Performed by: Barton Dubois   Total critical care time: 45 minutes  Critical care time was exclusive of separately billable procedures and treating other patients.  Critical care was necessary to treat or prevent imminent or life-threatening deterioration.  Critical care was time spent personally by me on the following activities: development of treatment plan with patient and/or surrogate as well as nursing, discussions with consultants, evaluation of patient's response to treatment, examination of patient, obtaining history from patient or surrogate, ordering and performing treatments and interventions, ordering and review of laboratory studies, ordering and review of radiographic studies, pulse oximetry and re-evaluation of patient's condition.    Barton Dubois, MD [facility]   To contact the attending provider between 7A-7P or the covering provider during after hours  7P-7A, please log into the web site [email or web address] and access using universal [place] password for that web site. If you do not have the password, please call the hospital operator.  12/17/2021, 8:39 AM

## 2021-12-17 NOTE — Progress Notes (Signed)
Peripherally Inserted Central Catheter Placement  The IV Nurse has discussed with the patient and/or persons authorized to consent for the patient, the purpose of this procedure and the potential benefits and risks involved with this procedure.  The benefits include less needle sticks, lab draws from the catheter, and the patient may be discharged home with the catheter. Risks include, but not limited to, infection, bleeding, blood clot (thrombus formation), and puncture of an artery; nerve damage and irregular heartbeat and possibility to perform a PICC exchange if needed/ordered by physician.  Alternatives to this procedure were also discussed.  Bard Power PICC patient education guide, fact sheet on infection prevention and patient information card has been provided to patient /or left at bedside.    PICC Placement Documentation  PICC Double Lumen 44/92/01 PICC Right Basilic 38 cm 0 cm (Active)  Site Assessment Clean;Dry;Intact 12/17/21 1023  Lumen #1 Status Flushed;Saline locked;Blood return noted 12/17/21 1023  Lumen #2 Status Blood return noted;Saline locked;Flushed 12/17/21 1023  Dressing Type Transparent;Securing device 12/17/21 1023  Antimicrobial disc in place? Yes 12/17/21 1023  Safety Lock Not Applicable 00/71/21 9758  Dressing Change Due 12/24/21 12/17/21 1023       Frances Maywood 12/17/2021, 10:26 AM

## 2021-12-17 NOTE — TOC Progression Note (Signed)
Transition of Care [facility]) - Progression Note    Patient Details  Name: Kristie Brooks MRN: [id number] Date of Birth: 1966/04/02  [facility] of [facility] CM/SW Contact  Boneta Lucks, RN Phone Number: 12/17/2021, 2:03 PM  Clinical Narrative:   Patient is from [place] re[facility]. Getting a PICC line today. She can return to [place]. Patient will need insurance auth with [place]. Need to be started 1-2 days before discharge. Plan for discharge is greater than 3 days.   Milus Glazier will start INS AUTH - call Santiago Glad [phone] to start [place].   Barriers to Discharge: Continued Medical Work up  Expected Discharge Plan and [place] and rehab               Readmission Risk Interventions Readmission Risk Prevention Plan 12/17/2021  Medication Screening Complete  Transportation Screening Complete  Some recent data might be hidden

## 2021-12-18 DIAGNOSIS — L89322 Pressure ulcer of left buttock, stage 2: Secondary | ICD-10-CM

## 2021-12-18 DIAGNOSIS — R579 Shock, unspecified: Secondary | ICD-10-CM | POA: Diagnosis not present

## 2021-12-18 DIAGNOSIS — L8990 Pressure ulcer of unspecified site, unspecified stage: Secondary | ICD-10-CM | POA: Insufficient documentation

## 2021-12-18 DIAGNOSIS — A419 Sepsis, unspecified organism: Secondary | ICD-10-CM | POA: Diagnosis not present

## 2021-12-18 LAB — BASIC METABOLIC PANEL
Anion gap: 8 (ref 5–15)
BUN: 16 mg/dL (ref 6–20)
CO2: 24 mmol/L (ref 22–32)
Calcium: 8 mg/dL — ABNORMAL LOW (ref 8.9–10.3)
Chloride: 104 mmol/L (ref 98–111)
Creatinine, Ser: 0.86 mg/dL (ref 0.44–1.00)
GFR, Estimated: >60 mL/min (ref >60)
Glucose, Bld: 107 mg/dL — ABNORMAL HIGH (ref 70–99)
Potassium: 3.7 mmol/L (ref 3.5–5.1)
Sodium: 136 mmol/L (ref 135–145)

## 2021-12-18 LAB — MRSA NEXT GEN BY PCR, NASAL: MRSA by PCR Next Gen: DETECTED — AB

## 2021-12-18 LAB — MAGNESIUM: Magnesium: 2 mg/dL (ref 1.7–2.4)

## 2021-12-18 MED ORDER — MUPIROCIN 2 % EX OINT
TOPICAL_OINTMENT | Freq: Two times a day (BID) | CUTANEOUS | Status: DC
  Administered 2021-12-18: 1 via NASAL
  Filled 2021-12-18 (×2): qty 22

## 2021-12-18 MED ORDER — NOREPINEPHRINE 4 MG/250ML-% IV SOLN
0.0000 ug/min | INTRAVENOUS | Status: DC
  Administered 2021-12-18: 21:00:00 2 ug/min via INTRAVENOUS
  Filled 2021-12-18: qty 250

## 2021-12-18 MED ORDER — ORAL CARE MOUTH RINSE
15.0000 mL | Freq: Two times a day (BID) | OROMUCOSAL | Status: DC
  Administered 2021-12-18 – 2021-12-25 (×11): 15 mL via OROMUCOSAL

## 2021-12-18 MED ORDER — LACTULOSE 10 GM/15ML PO SOLN
20.0000 g | Freq: Two times a day (BID) | ORAL | Status: DC
  Administered 2021-12-18 – 2021-12-20 (×5): 20 g via ORAL
  Filled 2021-12-18 (×5): qty 30

## 2021-12-18 NOTE — Progress Notes (Signed)
PROGRESS NOTE    Kristie Brooks  DJS:[id number] DOB: 11/10/1966 DOA: 12/15/2021 PCP: Keane Police, MD    Chief Complaint  Patient presents with   Leg Swelling    Brief Narrative:  As per H&P written by Dr. Clearence Ped on 12/16/21  Kristie Brooks  is a 55 y.o. female, with history of cirrhosis, presents ED for chief complaint of what she says is fluid overload.  What was reported by EMS states that she was sent in for fever and confusion.  Does report that her husband said she was confused that she has been thinks that she was.  She reports that she had fluid overload in her legs and thighs for 3 or 4 days.  She reports they have been weeping for 3 or 4 days.  She is also had erythema for 3 or 4 days.  She reports that all the symptoms started the same time.  She had fevers but she is unsure how high.  She denies any confusion.  She denies any cough, dysuria, chest pain, and vomiting.  She does report nausea intermittently since September.  She denies any diarrhea.  Patient reports no change in her normal abdominal pain.  She has had abdominal pain for a long time per her report.  Patient reports no purulent drainage from her bilateral legs.  Bilateral legs are wrapped by the ED at the time of my exam.  She does not have a history of an echo on file with our system.  Patient has no further complaints at this time.   Patient does not smoke, does not drink, does not use illicit drugs.  Patient is vaccinated for COVID.  Patient is full code.  Patient used to be a Marine scientist.   In the ED Temp 99.2, heart rate 100-118, respiratory 17-27, blood pressure at admission 84/34 but as low as systolic in the 88F Troponin 6, 7 Elevated white blood cell count 15.9, hemoglobin 11.3, platelets 126 Chemistry panel is unremarkable Albumin 1.9 Negative respiratory panel Blood culture pending CT pelvis shows marked diffuse subcu body wall edema with large volume ascites.  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 Together these findings are most in keeping with changes of portal venous hypertension Chest x-ray shows low lung volumes with mild to moderate severity bibasilar atelectasis and/or infiltrate with small left pleural effusion Patient was given cefepime, vancomycin however 3 L bolus Cintron was discussed with patient and patient declined.  She reports she still is 1 to be full code, but does not want a central line at this time.  Options for albumin discussed with patient.  Patient accepts albumin. Admission requested for possible sepsis secondary cellulitis   Assessment & Plan: 1-severe sepsis secondary to cellulitis -Bilateral lower extremity cellulitis appreciated -Patient met criteria at time of admission with tachycardia, elevated respiratory rate, elevated white blood cell count, low blood pressure, lactic acid 2.3 and also on presentation confusion as part of organ dysfunction. -Currently afebrile, mentation has significantly improved and patient is overall feeling better. -Continue current antibiotics and follow culture results. -Patient denies chest pain, no nausea, no vomiting. -Ongoing lower extremity discomfort reported; overall erythematous changes and edema improved. -Continue to follow recommendations by wound care service.  2-acute metabolic encephalopathy -In the setting of sepsis -Treatment as mentioned above -Ammonia level within normal limit -Minimize the use of medication that can alter sedation. -Provide constant reorientation.  3-history of cirrhosis with ascites/hypoalbuminemia -Currently no significant fluid to be drained  with paracentesis (as per IR recommendations); continue low-sodium diet and when allowed by blood pressure resume the use of diuretics. -Follow daily weights-strict I's and O's. -Patient has received albumin infusion with some improvement in blood pressure.  4-dermatitis lower extremity edema -Continue  dressing care and wound care recommendations. -Continue current IV antibiotics. -Pharmacy to assist with vancomycin level and dosing.  5-hypothyroidism -TSH elevated -Synthroid dose has been started; patient initially refusing medication unrelated and acceptance to take. -Plan is to repeat thyroid panel in 8-12 weeks with further adjustment to her Synthroid regimen at that time.  6-class III obesity -Portion control and low calorie diet recommended. -Body mass index is 43.18 kg/m.  7-hypotension -Low blood pressure running at baseline in the setting of cirrhosis: Acute infection contributing and playing a role in her presentation. -Blood pressure overall improved; continue weaning of Levophed with intention to maintain MAP above 65 -Continue midodrine 3 times a day.  8-constipation -Lactulose has been started.-  9-chronic respiratory failure with hypoxia -Patient reports using 3-4 L nasal cannula supplementation at least for the last couple of months prior to admission. -Currently 4-4.5 L in place with good O2 sat -Continue the use of IS and flutter valve.   DVT prophylaxis: Heparin Code Status: Full code Family Communication: No family at bedside. Disposition:   Status is: Inpatient    Consultants:  IR  Procedures:  See below for x-ray reports.  Antimicrobials:  Vancomycin   Subjective: Improved blood pressure appreciated; no chest pain, no nausea, no vomiting.  Positive anasarca.  Patient complains of constipation.  Objective: Vitals:   12/18/21 1330 12/18/21 1336 12/18/21 1345 12/18/21 1400  BP: (!) 108/48  (!) 101/47 (!) 101/52  Pulse: 89 87 93 86  Resp:  20  14  Temp:      TempSrc:      SpO2:    92%  Weight:      Height:        Intake/Output Summary (Last 24 hours) at 12/18/2021 1602 Last data filed at 12/18/2021 1344 Gross per 24 hour  Intake 859.59 ml  Output 500 ml  Net 359.59 ml   Filed Weights   12/15/21 2119 12/17/21 0500 12/18/21 0500   Weight: 131.5 kg 119.8 kg 117.7 kg    Examination: General exam: Alert, awake, oriented x 3; using 4 L of cannula supplementation (patient reports chronically using 3-4 L as an outpatient.  Afebrile and complaining of constipation and diffuse sweating.  Physical examination demonstrated anasarca. Respiratory system: No using accessory muscles; good saturation on 4-4.5 liters nasal cannula. Cardiovascular system:RRR.  No rubs, no gallops; no murmurs.  Unable to properly assess JVD with body habitus Gastrointestinal system: Abdomen is distended, positive increased abdominal girth, positive bowel sounds.  No guarding.   Central nervous system: Alert and oriented. No focal neurological deficits. Extremities: No cyanosis or clubbing.  3+ swelling edema, ongoing weeping/serosanguineous drainage and venous stasis dermatitis appreciated.  Less redness seen. Skin: No petechiae.  Stage II pressure injury in her left buttocks appreciated.  Present at time of admission.  No signs of superimposed infection. Psychiatry: Judgement and insight appear normal. Mood & affect appropriate.    Data Reviewed: I have personally reviewed following labs and imaging studies  CBC: Recent Labs  Lab 12/15/21 2215 12/17/21 0623  WBC 15.9* 9.8  NEUTROABS 14.2* 7.6  HGB 11.3* 8.8*  HCT 34.9* 26.0*  MCV 92.3 92.5  PLT 126* 105*    Basic Metabolic Panel: Recent Labs  Lab 12/15/21  2215 12/16/21 0001 12/16/21 2002 12/17/21 0443 12/18/21 0505  NA 133*  --  134* 135 136  K 3.7 3.8 3.4* 3.1* 3.7  CL 99  --  101 102 104  CO2 26  --  23 24 24   GLUCOSE 103*  --  157* 93 107*  BUN 14  --  16 17 16   CREATININE 1.03*  --  1.05* 1.03* 0.86  CALCIUM 8.0*  --  7.9* 8.0* 8.0*  MG  --   --   --  1.9 2.0    GFR: Estimated Creatinine Clearance: 93.7 mL/min (by C-G formula based on SCr of 0.86 mg/dL).  Liver Function Tests: Recent Labs  Lab 12/15/21 2215 12/16/21 2002 12/17/21 0443  AST 34 27 26  ALT 15 12 11    ALKPHOS 103 63 68  BILITOT 1.8* 1.7* 2.0*  PROT 5.1* 4.9* 4.8*  ALBUMIN 1.9* 2.9* 2.7*    CBG: No results for input(s): GLUCAP in the last 168 hours.   Recent Results (from the past 240 hour(s))  Resp Panel by RT-PCR (Flu A&B, Covid) Nasopharyngeal Swab     Status: None   Collection Time: 12/15/21  9:33 PM   Specimen: Nasopharyngeal Swab; Nasopharyngeal(NP) swabs in vial transport medium  Result Value Ref Range Status   SARS Coronavirus 2 by RT PCR NEGATIVE NEGATIVE Final    Comment: (NOTE) SARS-CoV-2 target nucleic acids are NOT DETECTED.  The SARS-CoV-2 RNA is generally detectable in upper respiratory specimens during the acute phase of infection. The lowest concentration of SARS-CoV-2 viral copies this assay can detect is 138 copies/mL. A negative result does not preclude SARS-Cov-2 infection and should not be used as the sole basis for treatment or other patient management decisions. A negative result may occur with  improper specimen collection/handling, submission of specimen other than nasopharyngeal swab, presence of viral mutation(s) within the areas targeted by this assay, and inadequate number of viral copies(<138 copies/mL). A negative result must be combined with clinical observations, patient history, and epidemiological information. The expected result is Negative.  Fact Sheet for Patients:  EntrepreneurPulse.com.au  Fact Sheet for Healthcare Providers:  IncredibleEmployment.be  This test is no t yet approved or cleared by the [place] FDA and  has been authorized for detection and/or diagnosis of SARS-CoV-2 by FDA under an Emergency Use Authorization (EUA). This EUA will remain  in effect (meaning this test can be used) for the duration of the COVID-19 declaration under Section 564(b)(1) of the Act, 21 U.S.C.section 360bbb-3(b)(1), unless the authorization is terminated  or revoked sooner.       Influenza A by  PCR NEGATIVE NEGATIVE Final   Influenza B by PCR NEGATIVE NEGATIVE Final    Comment: (NOTE) The Xpert Xpress SARS-CoV-2/FLU/RSV plus assay is intended as an aid in the diagnosis of influenza from Nasopharyngeal swab specimens and should not be used as a sole basis for treatment. Nasal washings and aspirates are unacceptable for Xpert Xpress SARS-CoV-2/FLU/RSV testing.  Fact Sheet for Patients: EntrepreneurPulse.com.au  Fact Sheet for Healthcare Providers: IncredibleEmployment.be  This test is not yet approved or cleared by the [place] FDA and has been authorized for detection and/or diagnosis of SARS-CoV-2 by FDA under an Emergency Use Authorization (EUA). This EUA will remain in effect (meaning this test can be used) for the duration of the COVID-19 declaration under Section 564(b)(1) of the Act, 21 U.S.C. section 360bbb-3(b)(1), unless the authorization is terminated or revoked.  Performed at [facility], [address] [place], [place]  [address]   Blood Culture (routine x 2)     Status: None (Preliminary result)   Collection Time: 12/15/21 10:15 PM   Specimen: BLOOD RIGHT ARM  Result Value Ref Range Status   Specimen Description BLOOD RIGHT ARM  Final   Special Requests   Final    BOTTLES DRAWN AEROBIC AND ANAEROBIC Blood Culture results may not be optimal due to an inadequate volume of blood received in culture bottles   Culture   Final    NO GROWTH 3 DAYS Performed at [facility], [address], [place], [place] [address]    Report Status PENDING  Incomplete  Blood Culture (routine x 2)     Status: None (Preliminary result)   Collection Time: 12/15/21 10:15 PM   Specimen: Right Antecubital; Blood  Result Value Ref Range Status   Specimen Description RIGHT ANTECUBITAL  Final   Special Requests   Final    BOTTLES DRAWN AEROBIC AND ANAEROBIC Blood Culture results may not be optimal due to an excessive volume of blood received in  culture bottles   Culture   Final    NO GROWTH 3 DAYS Performed at [facility], [address], [place], [place] [address]    Report Status PENDING  Incomplete  Urine Culture     Status: Abnormal   Collection Time: 12/16/21 12:59 PM   Specimen: Urine, Catheterized  Result Value Ref Range Status   Specimen Description   Final    URINE, CATHETERIZED Performed at [facility], [address], [place], [place] [address]    Special Requests   Final    NONE Performed at [facility], [address], [place], [place] [address]    Culture MULTIPLE SPECIES PRESENT, SUGGEST RECOLLECTION (A)  Final   Report Status 12/17/2021 FINAL  Final  MRSA Next Gen by PCR, Nasal     Status: Abnormal   Collection Time: 12/18/21  8:00 AM   Specimen: Nasal Mucosa; Nasal Swab  Result Value Ref Range Status   MRSA by PCR Next Gen DETECTED (A) NOT DETECTED Final    Comment: RESULT CALLED TO, READ BACK BY AND VERIFIED WITH: SMITH @ 1026 ON [id number] BY HENDERSON L (NOTE) The GeneXpert MRSA Assay (FDA approved for NASAL specimens only), is one component of a comprehensive MRSA colonization surveillance program. It is not intended to diagnose MRSA infection nor to guide or monitor treatment for MRSA infections. Test performance is not FDA approved in patients less than 46 years old. Performed at [facility], [address] [place], [place] [address]      Radiology Studies: No results found.   Scheduled Meds:  Chlorhexidine Gluconate Cloth  6 each Topical Daily   heparin  5,000 Units Subcutaneous Q8H   lactulose  20 g Oral BID   levothyroxine  100 mcg Oral Q0600   mouth rinse  15 mL Mouth Rinse BID   midodrine  5 mg Oral TID WC   mupirocin ointment   Nasal BID   sodium chloride flush  10-40 mL Intracatheter Q12H   Continuous Infusions:  vancomycin 10 mL/hr at 12/18/21 1344     LOS: 2 days   CRITICAL CARE Performed by: Barton Dubois   Total critical care time: 50 minutes  Critical care time  was exclusive of separately billable procedures and treating other patients.  Critical care was necessary to treat or prevent imminent or life-threatening deterioration.  Critical care was time spent personally by me on the following activities: development of treatment  plan with patient and/or surrogate as well as nursing, discussions with consultants, evaluation of patient's response to treatment, examination of patient, obtaining history from patient or surrogate, ordering and performing treatments and interventions, ordering and review of laboratory studies, ordering and review of radiographic studies, pulse oximetry and re-evaluation of patient's condition.   Barton Dubois, MD [facility]   To contact the attending provider between 7A-7P or the covering provider during after hours 7P-7A, please log into the web site [email or web address] and access using universal [place] password for that web site. If you do not have the password, please call the hospital operator.  12/18/2021, 4:02 PM

## 2021-12-18 NOTE — Progress Notes (Signed)
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 Or Actions Taken?:  Ordered bactroban ointment BID per protocol

## 2021-12-18 NOTE — Progress Notes (Addendum)
Patient alert and oriented x4. Levo drip titrated down and stopped at 1201 today. Blood pressures have been stable with maps mainly  >65 so far. Dr Dyann Kief aware.  Patient had difficulty using bedpan and passing large hard stool. Different attempts tried to assist patient to pass BM with no full relief. Patient stating it was too painful to pass and hard stool was noted at anal site with a little bright red bleeding coming out due to patient straining to push stool out with no effect. Gave a little bit of a Soap water enema which helped patient to push stool out to which patient expressed relief. Only about 100 ml was used and was immediately stopped when stool was softened enough to pass. Large hard brown stool output noted. Dr Dyann Kief made aware.

## 2021-12-19 DIAGNOSIS — R579 Shock, unspecified: Secondary | ICD-10-CM | POA: Diagnosis not present

## 2021-12-19 DIAGNOSIS — A419 Sepsis, unspecified organism: Secondary | ICD-10-CM | POA: Diagnosis not present

## 2021-12-19 DIAGNOSIS — L89322 Pressure ulcer of left buttock, stage 2: Secondary | ICD-10-CM | POA: Diagnosis not present

## 2021-12-19 LAB — COMPREHENSIVE METABOLIC PANEL
ALT: 13 U/L (ref 0–44)
AST: 28 U/L (ref 15–41)
Albumin: 2.3 g/dL — ABNORMAL LOW (ref 3.5–5.0)
Alkaline Phosphatase: 91 U/L (ref 38–126)
Anion gap: 11 (ref 5–15)
BUN: 14 mg/dL (ref 6–20)
CO2: 22 mmol/L (ref 22–32)
Calcium: 7.9 mg/dL — ABNORMAL LOW (ref 8.9–10.3)
Chloride: 103 mmol/L (ref 98–111)
Creatinine, Ser: 0.65 mg/dL (ref 0.44–1.00)
GFR, Estimated: >60 mL/min (ref >60)
Glucose, Bld: 138 mg/dL — ABNORMAL HIGH (ref 70–99)
Potassium: 3.3 mmol/L — ABNORMAL LOW (ref 3.5–5.1)
Sodium: 136 mmol/L (ref 135–145)
Total Bilirubin: 1.6 mg/dL — ABNORMAL HIGH (ref 0.3–1.2)
Total Protein: 4.5 g/dL — ABNORMAL LOW (ref 6.5–8.1)

## 2021-12-19 LAB — CBC
HCT: 29.2 % — ABNORMAL LOW (ref 36.0–46.0)
Hemoglobin: 9.8 g/dL — ABNORMAL LOW (ref 12.0–15.0)
MCH: 30.8 pg (ref 26.0–34.0)
MCHC: 33.6 g/dL (ref 30.0–36.0)
MCV: 91.8 fL (ref 80.0–100.0)
Platelets: 101 10*3/uL — ABNORMAL LOW (ref 150–400)
RBC: 3.18 MIL/uL — ABNORMAL LOW (ref 3.87–5.11)
RDW: 17.6 % — ABNORMAL HIGH (ref 11.5–15.5)
WBC: 7.8 10*3/uL (ref 4.0–10.5)
nRBC: 0 % (ref 0.0–0.2)

## 2021-12-19 LAB — VANCOMYCIN, TROUGH: Vancomycin Tr: 21 ug/mL (ref 15–20)

## 2021-12-19 MED ORDER — VANCOMYCIN HCL 1250 MG/250ML IV SOLN
1250.0000 mg | INTRAVENOUS | Status: DC
  Administered 2021-12-19 – 2021-12-22 (×4): 1250 mg via INTRAVENOUS
  Filled 2021-12-19 (×4): qty 250

## 2021-12-19 MED ORDER — MIDODRINE HCL 5 MG PO TABS
10.0000 mg | ORAL_TABLET | Freq: Three times a day (TID) | ORAL | Status: DC
  Administered 2021-12-19 – 2021-12-26 (×20): 10 mg via ORAL
  Filled 2021-12-19 (×20): qty 2

## 2021-12-19 MED ORDER — POTASSIUM CHLORIDE CRYS ER 20 MEQ PO TBCR
40.0000 meq | EXTENDED_RELEASE_TABLET | ORAL | Status: AC
  Administered 2021-12-19 (×2): 40 meq via ORAL
  Filled 2021-12-19 (×2): qty 2

## 2021-12-19 NOTE — Progress Notes (Signed)
PROGRESS NOTE    Kristie Brooks  LNL:[id number] DOB: [birth year] DOA: 12/15/2021 PCP: Keane Police, MD    Chief Complaint  Patient presents with   Leg Swelling    Brief Narrative:  As per H&P written by Dr. Clearence Ped on 12/16/21  Kristie Brooks  is a 55 y.o. female, with history of cirrhosis, presents ED for chief complaint of what she says is fluid overload.  What was reported by EMS states that she was sent in for fever and confusion.  Does report that her husband said she was confused that she has been thinks that she was.  She reports that she had fluid overload in her legs and thighs for 3 or 4 days.  She reports they have been weeping for 3 or 4 days.  She is also had erythema for 3 or 4 days.  She reports that all the symptoms started the same time.  She had fevers but she is unsure how high.  She denies any confusion.  She denies any cough, dysuria, chest pain, and vomiting.  She does report nausea intermittently since September.  She denies any diarrhea.  Patient reports no change in her normal abdominal pain.  She has had abdominal pain for a long time per her report.  Patient reports no purulent drainage from her bilateral legs.  Bilateral legs are wrapped by the ED at the time of my exam.  She does not have a history of an echo on file with our system.  Patient has no further complaints at this time.   Patient does not smoke, does not drink, does not use illicit drugs.  Patient is vaccinated for COVID.  Patient is full code.  Patient used to be a Marine scientist.   In the ED Temp 99.2, heart rate 100-118, respiratory 17-27, blood pressure at admission 84/34 but as low as systolic in the 40C Troponin 6, 7 Elevated white blood cell count 15.9, hemoglobin 11.3, platelets 126 Chemistry panel is unremarkable Albumin 1.9 Negative respiratory panel Blood culture pending CT pelvis shows marked diffuse subcu body wall edema with large volume ascites.  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 Together these findings are most in keeping with changes of portal venous hypertension Chest x-ray shows low lung volumes with mild to moderate severity bibasilar atelectasis and/or infiltrate with small left pleural effusion Patient was given cefepime, vancomycin however 3 L bolus Cintron was discussed with patient and patient declined.  She reports she still is 1 to be full code, but does not want a central line at this time.  Options for albumin discussed with patient.  Patient accepts albumin. Admission requested for possible sepsis secondary cellulitis   Assessment & Plan: 1-severe sepsis secondary to cellulitis -Bilateral lower extremity cellulitis appreciated -Patient met criteria at time of admission with tachycardia, elevated respiratory rate, elevated white blood cell count, low blood pressure, lactic acid 2.3 and also on presentation confusion as part of organ dysfunction. -Currently afebrile, mentation has significantly improved and patient is overall feeling better. -Continue current antibiotics and follow culture results. -Patient denies chest pain, no nausea, no vomiting. -Ongoing lower extremity discomfort reported; overall erythematous changes and edema improved. -Continue to follow recommendations by wound care service.  2-acute metabolic encephalopathy -In the setting of sepsis -Treatment as mentioned above -Ammonia level within normal limit -Minimize the use of medication that can alter sedation. -Provide constant reorientation.  3-history of cirrhosis with ascites/hypoalbuminemia -Currently no significant fluid to be drained  with paracentesis (as per IR recommendations); continue low-sodium diet and when allowed by blood pressure resume the use of diuretics. -Follow daily weights-strict I's and O's. -Patient has received albumin infusion with some improvement in blood pressure.  4-dermatitis lower extremity edema -Continue  dressing care and wound care recommendations. -Continue current IV antibiotics. -Pharmacy to assist with vancomycin level and dosing.  5-hypothyroidism -TSH elevated -Synthroid dose has been started; patient initially refusing medication unrelated and acceptance to take. -Plan is to repeat thyroid panel in 8-12 weeks with further adjustment to her Synthroid regimen at that time.  6-class III obesity -Portion control and low calorie diet recommended. -Body mass index is 41.97 kg/m.  7-hypotension -Low blood pressure running at baseline in the setting of cirrhosis: Acute infection contributing and playing a role in her presentation. -Blood pressure overall improved; continue to completely wean off weaning of Levophed -Continue midodrine 3 times a day. -Patient will need diuresis management once blood pressure allows.  8-constipation -Lactulose has been started.-  9-chronic respiratory failure with hypoxia -Patient reports using 3-4 L nasal cannula supplementation at least for the last couple of months prior to admission. -Currently 4 L in place with good O2 sat -Continue the use of IS and flutter valve.   DVT prophylaxis: Heparin Code Status: Full code Family Communication: No family at bedside. Disposition:   Status is: Inpatient    Consultants:  IR  Procedures:  See below for x-ray reports.  Antimicrobials:  Vancomycin   Subjective: Blood pressure has improved; overnight low dose of vasopressors provided.  No fever, no chest pain, no nausea, no vomiting.  Objective: Vitals:   12/19/21 0300 12/19/21 0400 12/19/21 0500 12/19/21 1400  BP: (!) 106/59 (!) 109/52 (!) 100/50 (!) 103/46  Pulse: 75 78 78 79  Resp: 20 (!) 26 (!) 30 17  Temp:  98.5 F (36.9 C)    TempSrc:  Oral    SpO2: 94% 94% 93% 94%  Weight:   114.4 kg   Height:        Intake/Output Summary (Last 24 hours) at 12/19/2021 1505 Last data filed at 12/19/2021 0830 Gross per 24 hour  Intake 710.12  ml  Output 350 ml  Net 360.12 ml   Filed Weights   12/17/21 0500 12/18/21 0500 12/19/21 0500  Weight: 119.8 kg 117.7 kg 114.4 kg    Examination: General exam: Alert, awake, oriented x 3, no fever, no nausea, no vomiting.  Good saturation on chronic supplementation.  Positive anasarca. Respiratory system: Clear to auscultation. Respiratory effort normal.  No using accessory muscles. Cardiovascular system:RRR. No murmurs, rubs, gallops.  Unable to properly assess JVD with body habitus. Gastrointestinal system: Abdomen is mildly distended, positive increased abdominal girth appreciated on exam.  Positive bowel sounds and positive fluid wave on exam.   Central nervous system: Alert and oriented. No focal neurological deficits. Extremities: No cyanosis or clubbing; 3+ edema with appreciated weeping.  Less redness seen.  Patient reports an improvement in pain Skin: No petechiae.  Stage II pressure injury in her left buttocks appreciated; present at time of admission; no superimposed infection. Psychiatry: Judgement and insight appear normal. Mood & affect appropriate.   Data Reviewed: I have personally reviewed following labs and imaging studies  CBC: Recent Labs  Lab 12/15/21 2215 12/17/21 0623 12/19/21 0524  WBC 15.9* 9.8 7.8  NEUTROABS 14.2* 7.6  --   HGB 11.3* 8.8* 9.8*  HCT 34.9* 26.0* 29.2*  MCV 92.3 92.5 91.8  PLT 126* 105* 101*  Basic Metabolic Panel: Recent Labs  Lab 12/15/21 2215 12/16/21 0001 12/16/21 2002 12/17/21 0443 12/18/21 0505 12/19/21 0524  NA 133*  --  134* 135 136 136  K 3.7 3.8 3.4* 3.1* 3.7 3.3*  CL 99  --  101 102 104 103  CO2 26  --  23 24 24 22   GLUCOSE 103*  --  157* 93 107* 138*  BUN 14  --  16 17 16 14   CREATININE 1.03*  --  1.05* 1.03* 0.86 0.65  CALCIUM 8.0*  --  7.9* 8.0* 8.0* 7.9*  MG  --   --   --  1.9 2.0  --     GFR: Estimated Creatinine Clearance: 99.2 mL/min (by C-G formula based on SCr of 0.65 mg/dL).  Liver Function  Tests: Recent Labs  Lab 12/15/21 2215 12/16/21 2002 12/17/21 0443 12/19/21 0524  AST 34 27 26 28   ALT 15 12 11 13   ALKPHOS 103 63 68 91  BILITOT 1.8* 1.7* 2.0* 1.6*  PROT 5.1* 4.9* 4.8* 4.5*  ALBUMIN 1.9* 2.9* 2.7* 2.3*    CBG: No results for input(s): GLUCAP in the last 168 hours.   Recent Results (from the past 240 hour(s))  Resp Panel by RT-PCR (Flu A&B, Covid) Nasopharyngeal Swab     Status: None   Collection Time: 12/15/21  9:33 PM   Specimen: Nasopharyngeal Swab; Nasopharyngeal(NP) swabs in vial transport medium  Result Value Ref Range Status   SARS Coronavirus 2 by RT PCR NEGATIVE NEGATIVE Final    Comment: (NOTE) SARS-CoV-2 target nucleic acids are NOT DETECTED.  The SARS-CoV-2 RNA is generally detectable in upper respiratory specimens during the acute phase of infection. The lowest concentration of SARS-CoV-2 viral copies this assay can detect is 138 copies/mL. A negative result does not preclude SARS-Cov-2 infection and should not be used as the sole basis for treatment or other patient management decisions. A negative result may occur with  improper specimen collection/handling, submission of specimen other than nasopharyngeal swab, presence of viral mutation(s) within the areas targeted by this assay, and inadequate number of viral copies(<138 copies/mL). A negative result must be combined with clinical observations, patient history, and epidemiological information. The expected result is Negative.  Fact Sheet for Patients:  EntrepreneurPulse.com.au  Fact Sheet for Healthcare Providers:  IncredibleEmployment.be  This test is no t yet approved or cleared by the [place] FDA and  has been authorized for detection and/or diagnosis of SARS-CoV-2 by FDA under an Emergency Use Authorization (EUA). This EUA will remain  in effect (meaning this test can be used) for the duration of the COVID-19 declaration under Section  564(b)(1) of the Act, 21 U.S.C.section 360bbb-3(b)(1), unless the authorization is terminated  or revoked sooner.       Influenza A by PCR NEGATIVE NEGATIVE Final   Influenza B by PCR NEGATIVE NEGATIVE Final    Comment: (NOTE) The Xpert Xpress SARS-CoV-2/FLU/RSV plus assay is intended as an aid in the diagnosis of influenza from Nasopharyngeal swab specimens and should not be used as a sole basis for treatment. Nasal washings and aspirates are unacceptable for Xpert Xpress SARS-CoV-2/FLU/RSV testing.  Fact Sheet for Patients: EntrepreneurPulse.com.au  Fact Sheet for Healthcare Providers: IncredibleEmployment.be  This test is not yet approved or cleared by the [place] FDA and has been authorized for detection and/or diagnosis of SARS-CoV-2 by FDA under an Emergency Use Authorization (EUA). This EUA will remain in effect (meaning this test can be used) for the duration of the COVID-19  declaration under Section 564(b)(1) of the Act, 21 U.S.C. section 360bbb-3(b)(1), unless the authorization is terminated or revoked.  Performed at [facility], [address] [place], [place] [address]   Blood Culture (routine x 2)     Status: None (Preliminary result)   Collection Time: 12/15/21 10:15 PM   Specimen: BLOOD RIGHT ARM  Result Value Ref Range Status   Specimen Description BLOOD RIGHT ARM  Final   Special Requests   Final    BOTTLES DRAWN AEROBIC AND ANAEROBIC Blood Culture results may not be optimal due to an inadequate volume of blood received in culture bottles   Culture   Final    NO GROWTH 3 DAYS Performed at [facility], [address], [place], [place] [address]    Report Status PENDING  Incomplete  Blood Culture (routine x 2)     Status: None (Preliminary result)   Collection Time: 12/15/21 10:15 PM   Specimen: Right Antecubital; Blood  Result Value Ref Range Status   Specimen Description RIGHT ANTECUBITAL  Final   Special  Requests   Final    BOTTLES DRAWN AEROBIC AND ANAEROBIC Blood Culture results may not be optimal due to an excessive volume of blood received in culture bottles   Culture   Final    NO GROWTH 3 DAYS Performed at [facility], [address], [place], [place] [address]    Report Status PENDING  Incomplete  Urine Culture     Status: Abnormal   Collection Time: 12/16/21 12:59 PM   Specimen: Urine, Catheterized  Result Value Ref Range Status   Specimen Description   Final    URINE, CATHETERIZED Performed at [facility], [address] [place], [place] [address]    Special Requests   Final    NONE Performed at [facility], [address], [place], [place] [address]    Culture MULTIPLE SPECIES PRESENT, SUGGEST RECOLLECTION (A)  Final   Report Status 12/17/2021 FINAL  Final  MRSA Next Gen by PCR, Nasal     Status: Abnormal   Collection Time: 12/18/21  8:00 AM   Specimen: Nasal Mucosa; Nasal Swab  Result Value Ref Range Status   MRSA by PCR Next Gen DETECTED (A) NOT DETECTED Final    Comment: RESULT CALLED TO, READ BACK BY AND VERIFIED WITH: SMITH @ 1026 ON [id number] BY HENDERSON L (NOTE) The GeneXpert MRSA Assay (FDA approved for NASAL specimens only), is one component of a comprehensive MRSA colonization surveillance program. It is not intended to diagnose MRSA infection nor to guide or monitor treatment for MRSA infections. Test performance is not FDA approved in patients less than 74 years old. Performed at [facility], [address] [place], [place] [address]      Radiology Studies: No results found.   Scheduled Meds:  Chlorhexidine Gluconate Cloth  6 each Topical Daily   heparin  5,000 Units Subcutaneous Q8H   lactulose  20 g Oral BID   levothyroxine  100 mcg Oral Q0600   mouth rinse  15 mL Mouth Rinse BID   midodrine  5 mg Oral TID WC   mupirocin ointment   Nasal BID   sodium chloride flush  10-40 mL Intracatheter Q12H   Continuous Infusions:  norepinephrine  (LEVOPHED) Adult infusion 1 mcg/min (12/19/21 1313)   vancomycin       LOS: 3 days   CRITICAL CARE Performed by: Barton Dubois   Total critical care time: 50 minutes  Critical care time was exclusive of  separately billable procedures and treating other patients.  Critical care was necessary to treat or prevent imminent or life-threatening deterioration.  Critical care was time spent personally by me on the following activities: development of treatment plan with patient and/or surrogate as well as nursing, discussions with consultants, evaluation of patient's response to treatment, examination of patient, obtaining history from patient or surrogate, ordering and performing treatments and interventions, ordering and review of laboratory studies, ordering and review of radiographic studies, pulse oximetry and re-evaluation of patient's condition.   Barton Dubois, MD [facility]   To contact the attending provider between 7A-7P or the covering provider during after hours 7P-7A, please log into the web site [email or web address] and access using universal [place] password for that web site. If you do not have the password, please call the hospital operator.  12/19/2021, 3:05 PM

## 2021-12-19 NOTE — Progress Notes (Signed)
Pharmacy Antibiotic Note  Kristie Brooks is a 55 y.o. female admitted on 12/15/2021 with cellulitis.  Pharmacy has been consulted for vancomycin dosing.  Vancomycin trough 21  Vancomycin goal trough 10-15   Plan: Vancomycin 1250mg  IV Q24H.   Height: 5\' 5"  (165.1 cm) Weight: 114.4 kg (252 lb 3.3 oz) IBW/kg (Calculated) : 57  Temp (24hrs), Avg:98.2 F (36.8 C), Min:98 F (36.7 C), Max:98.5 F (36.9 C)  Recent Labs  Lab 12/15/21 2215 12/16/21 0001 12/16/21 2002 12/17/21 0443 12/17/21 0623 12/18/21 0505 12/19/21 0524 12/19/21 1116  WBC 15.9*  --   --   --  9.8  --  7.8  --   CREATININE 1.03*  --  1.05* 1.03*  --  0.86 0.65  --   LATICACIDVEN 2.3* 2.0*  --  1.3  --   --   --   --   VANCOTROUGH  --   --   --   --   --   --   --  21*     Estimated Creatinine Clearance: 99.2 mL/min (by C-G formula based on SCr of 0.65 mg/dL).    Allergies  Allergen Reactions   Dakin's [Sodium Hypochlorite] Dermatitis   Zosyn [Piperacillin Sod-Tazobactam So] Other (See Comments)    Blood count dropped     Thank you for allowing pharmacy to be a part of this patients care.  Thomasenia Sales, PharmD, [facility] Clinical Pharmacist  12/19/2021 2:20 PM

## 2021-12-20 ENCOUNTER — Encounter (HOSPITAL_COMMUNITY): Payer: Self-pay | Admitting: Family Medicine

## 2021-12-20 DIAGNOSIS — A419 Sepsis, unspecified organism: Principal | ICD-10-CM

## 2021-12-20 DIAGNOSIS — G9340 Encephalopathy, unspecified: Secondary | ICD-10-CM

## 2021-12-20 DIAGNOSIS — R601 Generalized edema: Secondary | ICD-10-CM

## 2021-12-20 DIAGNOSIS — Z7189 Other specified counseling: Secondary | ICD-10-CM

## 2021-12-20 DIAGNOSIS — L89322 Pressure ulcer of left buttock, stage 2: Secondary | ICD-10-CM | POA: Diagnosis not present

## 2021-12-20 DIAGNOSIS — R609 Edema, unspecified: Secondary | ICD-10-CM | POA: Diagnosis not present

## 2021-12-20 DIAGNOSIS — R6521 Severe sepsis with septic shock: Secondary | ICD-10-CM | POA: Diagnosis not present

## 2021-12-20 DIAGNOSIS — Z515 Encounter for palliative care: Secondary | ICD-10-CM | POA: Diagnosis not present

## 2021-12-20 DIAGNOSIS — K920 Hematemesis: Secondary | ICD-10-CM

## 2021-12-20 DIAGNOSIS — R579 Shock, unspecified: Secondary | ICD-10-CM | POA: Diagnosis not present

## 2021-12-20 LAB — CULTURE, BLOOD (ROUTINE X 2)
Culture: NO GROWTH
Culture: NO GROWTH

## 2021-12-20 LAB — CBC
HCT: 31.8 % — ABNORMAL LOW (ref 36.0–46.0)
Hemoglobin: 10.4 g/dL — ABNORMAL LOW (ref 12.0–15.0)
MCH: 29.7 pg (ref 26.0–34.0)
MCHC: 32.7 g/dL (ref 30.0–36.0)
MCV: 90.9 fL (ref 80.0–100.0)
Platelets: 116 10*3/uL — ABNORMAL LOW (ref 150–400)
RBC: 3.5 MIL/uL — ABNORMAL LOW (ref 3.87–5.11)
RDW: 17.8 % — ABNORMAL HIGH (ref 11.5–15.5)
WBC: 8 10*3/uL (ref 4.0–10.5)
nRBC: 0 % (ref 0.0–0.2)

## 2021-12-20 MED ORDER — PANTOPRAZOLE SODIUM 40 MG IV SOLR
40.0000 mg | Freq: Two times a day (BID) | INTRAVENOUS | Status: DC
  Administered 2021-12-20 – 2021-12-23 (×7): 40 mg via INTRAVENOUS
  Filled 2021-12-20 (×7): qty 40

## 2021-12-20 MED ORDER — LACTULOSE 10 GM/15ML PO SOLN
20.0000 g | Freq: Every day | ORAL | Status: DC
  Administered 2021-12-21: 08:00:00 20 g via ORAL
  Filled 2021-12-20 (×2): qty 30

## 2021-12-20 NOTE — Progress Notes (Signed)
PROGRESS NOTE    Kristie Brooks  YOV:[id number] DOB: 1966-01-29 DOA: 12/15/2021 PCP: Keane Police, MD    Chief Complaint  Patient presents with   Leg Swelling    Brief Narrative:  As per H&P written by Dr. Clearence Ped on 12/16/21  Kristie Brooks  is a 55 y.o. female, with history of cirrhosis, presents ED for chief complaint of what she says is fluid overload.  What was reported by EMS states that she was sent in for fever and confusion.  Does report that her husband said she was confused that she has been thinks that she was.  She reports that she had fluid overload in her legs and thighs for 3 or 4 days.  She reports they have been weeping for 3 or 4 days.  She is also had erythema for 3 or 4 days.  She reports that all the symptoms started the same time.  She had fevers but she is unsure how high.  She denies any confusion.  She denies any cough, dysuria, chest pain, and vomiting.  She does report nausea intermittently since September.  She denies any diarrhea.  Patient reports no change in her normal abdominal pain.  She has had abdominal pain for a long time per her report.  Patient reports no purulent drainage from her bilateral legs.  Bilateral legs are wrapped by the ED at the time of my exam.  She does not have a history of an echo on file with our system.  Patient has no further complaints at this time.   Patient does not smoke, does not drink, does not use illicit drugs.  Patient is vaccinated for COVID.  Patient is full code.  Patient used to be a Marine scientist.   In the ED Temp 99.2, heart rate 100-118, respiratory 17-27, blood pressure at admission 84/34 but as low as systolic in the 74J Troponin 6, 7 Elevated white blood cell count 15.9, hemoglobin 11.3, platelets 126 Chemistry panel is unremarkable Albumin 1.9 Negative respiratory panel Blood culture pending CT pelvis shows marked diffuse subcu body wall edema with large volume ascites.  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 Together these findings are most in keeping with changes of portal venous hypertension Chest x-ray shows low lung volumes with mild to moderate severity bibasilar atelectasis and/or infiltrate with small left pleural effusion Patient was given cefepime, vancomycin however 3 L bolus Cintron was discussed with patient and patient declined.  She reports she still is 1 to be full code, but does not want a central line at this time.  Options for albumin discussed with patient.  Patient accepts albumin. Admission requested for possible sepsis secondary cellulitis   Assessment & Plan: 1-severe sepsis secondary to cellulitis -Bilateral lower extremity cellulitis appreciated -Patient met criteria at time of admission with tachycardia, elevated respiratory rate, elevated white blood cell count, low blood pressure, lactic acid 2.3 and also on presentation confusion as part of organ dysfunction. -Currently afebrile, mentation has significantly improved and patient is overall feeling better. -Continue current antibiotics and follow culture results. -Patient denies chest pain, no nausea or vomiting currently; but experienced dark emesis event overnight.  -Ongoing lower extremity discomfort reported; overall erythematous changes and edema improved. -Continue to follow recommendations by wound care service.  2-acute metabolic encephalopathy -In the setting of sepsis -Treatment as mentioned above -Ammonia level within normal limit -Minimize the use of medication that can alter sedation. -Provide constant reorientation.  3-history of cirrhosis with  ascites/hypoalbuminemia -Currently no significant fluid to be drained with paracentesis (as per IR recommendations); continue low-sodium diet and when allowed by blood pressure resume the use of diuretics. -Follow daily weights-strict I's and O's. -Patient has received albumin infusion with some improvement in blood  pressure.  4-dermatitis lower extremity edema -Continue dressing care and wound care recommendations. -Continue current IV antibiotics. -Pharmacy to assist with vancomycin level and dosing.  5-hypothyroidism -TSH elevated -Synthroid dose has been started; patient initially refusing medication unrelated and acceptance to take. -Plan is to repeat thyroid panel in 8-12 weeks with further adjustment to her Synthroid regimen at that time.  6-class III obesity -Portion control and low calorie diet recommended. -Body mass index is 42.7 kg/m.  7-hypotension -Low blood pressure running at baseline in the setting of cirrhosis: Acute infection contributing and playing a role in her presentation. -Blood pressure overall improved and off pressors. -Continue adjusted dose of midodrine 3 times a day. -Patient will need diuresis management once blood pressure allows; hopefully able to start some IV lasix in the next 24 hours.  8-constipation -Lactulose has been started.-  9-chronic respiratory failure with hypoxia -Patient reports using 3-4 L nasal cannula supplementation at least for the last couple of months prior to admission. -Currently 4 L in place with good O2 sat -Continue the use of IS and flutter valve.  10-dark hematemesis -patient reported never has had EGD -concerning for gastritis and ? Varices given hx of cirrhosis -GI consulted -Hgb stable -not longer feeling nauseated and expressed no further vomiting. -continue PPI   DVT prophylaxis: Heparin Code Status: Full code Family Communication: No family at bedside. Disposition:   Status is: Inpatient    Consultants:  IR GI  Procedures:  See below for x-ray reports.  Antimicrobials:  Vancomycin   Subjective: Afebrile, no CP, in no acute distress; currently off pressors. Overnight with active complaints of nausea and vomiting; dark emesis appreciated.   Objective: Vitals:   12/20/21 1040 12/20/21 1100 12/20/21  1114 12/20/21 1200  BP: (!) 114/46 (!) 110/47  107/60  Pulse: 79 82 81 82  Resp: 19 20 (!) 22 20  Temp:   (!) 97.4 F (36.3 C)   TempSrc:   Oral   SpO2: 98% 98% 98% 97%  Weight:      Height:        Intake/Output Summary (Last 24 hours) at 12/20/2021 1211 Last data filed at 12/20/2021 0853 Gross per 24 hour  Intake 692.76 ml  Output --  Net 692.76 ml   Filed Weights   12/18/21 0500 12/19/21 0500 12/20/21 0507  Weight: 117.7 kg 114.4 kg 116.4 kg    Examination: General exam: Alert, awake, oriented x 3, no CP, no fever, reporting no further nausea or vomiting currently. Positive anasarca and complaining of pain in her legs. Respiratory system: Clear to auscultation. Respiratory effort normal. Good sat on chronic supplementation. No using accessory muscles.  Cardiovascular system:RRR. No murmurs, rubs, gallops; unable to assess JVD with body habitus.  Gastrointestinal system: Abdomen is obese, mildly tender on palpation (chronic according to patients reports); positive fluid wave and increased abd girth appreciated on exam.  Central nervous system: Alert and oriented. No focal neurological deficits. Extremities: No cyanosis, no clubbing. Skin: No petechiae; unchanged dermatitis changes, redness, swelling and weeping process in her legs; also with stage 2  pressure injury in her left buttocks POA. Psychiatry: Judgement and insight appear normal. Mood & affect appropriate.    Data Reviewed: I have personally reviewed  following labs and imaging studies  CBC: Recent Labs  Lab 12/15/21 2215 12/17/21 0623 12/19/21 0524 12/20/21 0424  WBC 15.9* 9.8 7.8 8.0  NEUTROABS 14.2* 7.6  --   --   HGB 11.3* 8.8* 9.8* 10.4*  HCT 34.9* 26.0* 29.2* 31.8*  MCV 92.3 92.5 91.8 90.9  PLT 126* 105* 101* 116*    Basic Metabolic Panel: Recent Labs  Lab 12/15/21 2215 12/16/21 0001 12/16/21 2002 12/17/21 0443 12/18/21 0505 12/19/21 0524  NA 133*  --  134* 135 136 136  K 3.7 3.8 3.4* 3.1*  3.7 3.3*  CL 99  --  101 102 104 103  CO2 26  --  _0 GLUCOSE 103*  --  157* 93 107* 138*  BUN 14  --  _1 CREATININE 1.03*  --  1.05* 1.03* 0.86 0.65  CALCIUM 8.0*  --  7.9* 8.0* 8.0* 7.9*  MG  --   --   --  1.9 2.0  --     GFR: Estimated Creatinine Clearance: 100.2 mL/min (by C-G formula based on SCr of 0.65 mg/dL).  Liver Function Tests: Recent Labs  Lab 12/15/21 2215 12/16/21 2002 12/17/21 0443 12/19/21 0524  AST 34 _2 ALT _3 ALKPHOS 103 63 68 91  BILITOT 1.8* 1.7* 2.0* 1.6*  PROT 5.1* 4.9* 4.8* 4.5*  ALBUMIN 1.9* 2.9* 2.7* 2.3*    CBG: No results for input(s): GLUCAP in the last 168 hours.   Recent Results (from the past 240 hour(s))  Resp Panel by RT-PCR (Flu A&B, Covid) Nasopharyngeal Swab     Status: None   Collection Time: 12/15/21  9:33 PM   Specimen: Nasopharyngeal Swab; Nasopharyngeal(NP) swabs in vial transport medium  Result Value Ref Range Status   SARS Coronavirus 2 by RT PCR NEGATIVE NEGATIVE Final    Comment: (NOTE) SARS-CoV-2 target nucleic acids are NOT DETECTED.  The SARS-CoV-2 RNA is generally detectable in upper respiratory specimens during the acute phase of infection. The lowest concentration of SARS-CoV-2 viral copies this assay can detect is 138 copies/mL. A negative result does not preclude SARS-Cov-2 infection and should not be used as the sole basis for treatment or other patient management decisions. A negative result may occur with  improper specimen collection/handling, submission of specimen other than nasopharyngeal swab, presence of viral mutation(s) within the areas targeted by this assay, and inadequate number of viral copies(<138 copies/mL). A negative result must be combined with clinical observations, patient history, and epidemiological information. The expected result is Negative.  Fact Sheet for Patients:  EntrepreneurPulse.com.au  Fact Sheet for Healthcare  Providers:  IncredibleEmployment.be  This test is no t yet approved or cleared by the [place] FDA and  has been authorized for detection and/or diagnosis of SARS-CoV-2 by FDA under an Emergency Use Authorization (EUA). This EUA will remain  in effect (meaning this test can be used) for the duration of the COVID-19 declaration under Section 564(b)(1) of the Act, 21 U.S.C.section 360bbb-3(b)(1), unless the authorization is terminated  or revoked sooner.       Influenza A by PCR NEGATIVE NEGATIVE Final   Influenza B by PCR NEGATIVE NEGATIVE Final    Comment: (NOTE) The Xpert Xpress SARS-CoV-2/FLU/RSV plus assay is intended as an aid in the diagnosis of influenza from Nasopharyngeal swab specimens and should not be used as a sole basis for treatment. Nasal washings and aspirates are unacceptable for Xpert Xpress SARS-CoV-2/FLU/RSV testing.  Fact Sheet for Patients: EntrepreneurPulse.com.au  Fact Sheet for Healthcare Providers: IncredibleEmployment.be  This test is not yet approved or cleared by the [place] FDA and has been authorized for detection and/or diagnosis of SARS-CoV-2 by FDA under an Emergency Use Authorization (EUA). This EUA will remain in effect (meaning this test can be used) for the duration of the COVID-19 declaration under Section 564(b)(1) of the Act, 21 U.S.C. section 360bbb-3(b)(1), unless the authorization is terminated or revoked.  Performed at [facility], [address], [place], [place] [address]   Blood Culture (routine x 2)     Status: None   Collection Time: 12/15/21 10:15 PM   Specimen: BLOOD RIGHT ARM  Result Value Ref Range Status   Specimen Description BLOOD RIGHT ARM  Final   Special Requests   Final    BOTTLES DRAWN AEROBIC AND ANAEROBIC Blood Culture results may not be optimal due to an inadequate volume of blood received in culture bottles   Culture   Final    NO GROWTH 5  DAYS Performed at [facility], [address], [place], [place] [address]    Report Status 12/20/2021 FINAL  Final  Blood Culture (routine x 2)     Status: None   Collection Time: 12/15/21 10:15 PM   Specimen: Right Antecubital; Blood  Result Value Ref Range Status   Specimen Description RIGHT ANTECUBITAL  Final   Special Requests   Final    BOTTLES DRAWN AEROBIC AND ANAEROBIC Blood Culture results may not be optimal due to an excessive volume of blood received in culture bottles   Culture   Final    NO GROWTH 5 DAYS Performed at [facility], [address], [place], [place] [address]    Report Status 12/20/2021 FINAL  Final  Urine Culture     Status: Abnormal   Collection Time: 12/16/21 12:59 PM   Specimen: Urine, Catheterized  Result Value Ref Range Status   Specimen Description   Final    URINE, CATHETERIZED Performed at [facility], [address], [place], [place] [address]    Special Requests   Final    NONE Performed at [facility], [address] [place], [place] [address]    Culture MULTIPLE SPECIES PRESENT, SUGGEST RECOLLECTION (A)  Final   Report Status 12/17/2021 FINAL  Final  MRSA Next Gen by PCR, Nasal     Status: Abnormal   Collection Time: 12/18/21  8:00 AM   Specimen: Nasal Mucosa; Nasal Swab  Result Value Ref Range Status   MRSA by PCR Next Gen DETECTED (A) NOT DETECTED Final    Comment: RESULT CALLED TO, READ BACK BY AND VERIFIED WITH: SMITH @ 1026 ON [id number] BY HENDERSON L (NOTE) The GeneXpert MRSA Assay (FDA approved for NASAL specimens only), is one component of a comprehensive MRSA colonization surveillance program. It is not intended to diagnose MRSA infection nor to guide or monitor treatment for MRSA infections. Test performance is not FDA approved in patients less than 4 years old. Performed at [facility], [address] [place], [place] [address]      Radiology Studies: No results found.   Scheduled Meds:  Chlorhexidine Gluconate  Cloth  6 each Topical Daily   [START ON 12/21/2021] lactulose  20 g Oral Daily   levothyroxine  100 mcg Oral Q0600   mouth rinse  15 mL Mouth Rinse BID   midodrine  10 mg Oral TID WC   mupirocin ointment   Nasal BID   pantoprazole (  PROTONIX) IV  40 mg Intravenous Q12H   sodium chloride flush  10-40 mL Intracatheter Q12H   Continuous Infusions:  vancomycin 1,250 mg (12/19/21 2133)     LOS: 4 days   Stepdown CARE Performed by: Barton Dubois   Total critical care time: 40 minutes  Critical care time was exclusive of separately billable procedures and treating other patients.  Critical care was necessary to treat or prevent imminent or life-threatening deterioration.  Critical care was time spent personally by me on the following activities: development of treatment plan with patient and/or surrogate as well as nursing, discussions with consultants, evaluation of patient's response to treatment, examination of patient, obtaining history from patient or surrogate, ordering and performing treatments and interventions, ordering and review of laboratory studies, ordering and review of radiographic studies, pulse oximetry and re-evaluation of patient's condition.  Barton Dubois, MD [facility]   To contact the attending provider between 7A-7P or the covering provider during after hours 7P-7A, please log into the web site [email or web address] and access using universal [place] password for that web site. If you do not have the password, please call the hospital operator.  12/20/2021, 12:11 PM

## 2021-12-20 NOTE — Progress Notes (Signed)
RN notified that patient had dark red emesis. Zofran given. CBC ordered. Protonix BID ordered. NPO. Consider consult to GI.

## 2021-12-20 NOTE — Plan of Care (Signed)
°  Problem: Acute Rehab OT Goals (only OT should resolve) Goal: Pt. Will Perform Grooming Flowsheets (Taken 12/20/2021 1011) Pt Will Perform Grooming:  with supervision  standing Goal: Pt. Will Perform Upper Body Dressing Flowsheets (Taken 12/20/2021 1011) Pt Will Perform Upper Body Dressing:  with modified independence  sitting Goal: Pt. Will Transfer To Toilet Flowsheets (Taken 12/20/2021 1011) Pt Will Transfer to Toilet:  with supervision  ambulating  regular height toilet Goal: Pt. Will Perform Toileting-Clothing Manipulation Flowsheets (Taken 12/20/2021 1011) Pt Will Perform Toileting - Clothing Manipulation and hygiene:  with modified independence  sitting/lateral leans  sit to/from stand Goal: Pt/Caregiver Will Perform Home Exercise Program Flowsheets (Taken 12/20/2021 1011) Pt/caregiver will Perform Home Exercise Program:  Increased strength  Both right and left upper extremity  Independently  With written HEP provided

## 2021-12-20 NOTE — TOC Progression Note (Signed)
Transition of Care [facility]) - Progression Note    Patient Details  Name: Chenika Nevils MRN: [id number] Date of Birth: 02/26/1966  Transition of Care [facility] CM/SW Contact  Salome Arnt, [place] Phone Number: 12/20/2021, 12:31 PM  Clinical Narrative:  LCSW provided update to Santiago Glad at [facility]. Anticipate d/c in couple days. Notified Santiago Glad of [employer] waiver. She plans to look into this or will start auth if unable to verify. TOC will continue to follow.        Barriers to Discharge: Continued Medical Work up  Expected Discharge Plan and Services                                                 Social Determinants of Health (SDOH) Interventions    Readmission Risk Interventions Readmission Risk Prevention Plan 12/17/2021  Medication Screening Complete  Transportation Screening Complete  Some recent data might be hidden

## 2021-12-20 NOTE — Consult Note (Signed)
Referring Provider: No ref. provider found Primary Care Physician:  Keane Police, MD - [facility] Primary Gastroenterologist:  Dr. Comer Locket - [facility]  Reason for Consultation: Blood in vomitus.   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 coli)  Patient admitted to the ICU with leukocytosis and hypotension.  Started on midodrine and Levophed ( low-dose) broad-spectrum antibiotics.  Clinical status has improved over the past 4 days (mental status, white count and vital signs).  Last evening,after taking a dose of lactulose, she became nauseated and then either spit up or vomited a small amount of bloody fluid.  Following this, she had hematemesis of brown appearing gastric contents.  Symptoms subsided.  Patient notes occasional reflux symptoms.  No dysphagia.  No acid suppression therapy chronically.  No melena or any hematochezia seen by patient or witnessed by nursing staff. Hemoglobin 10.4 this morning; 8.3 days ago.  Platelets 116.  Hospital course over the past 4 days noted.  Impacted on admission.  Relieved with soapsuds enemas.  Lactulose initiated.  Patient states she has been on hydrocodone and oxycodone for pain management since her debridement at [facility] in November.  No lactulose or other laxative regimen at the nursing home.  Reports no bowel movement for much of this month.  She denies cirrhosis care.  States she has not followed up with Dr. Comer Locket in years now.  No prior GI bleed.  No prior EGD or colonoscopy although patient admits she  has been offered these procedures previously but has always declined.  CT abdomen last month [facility] and pelvic CT this admission reveals advanced changes of cirrhosis without focal hepatic lesion.  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  Anasarca and moderate ascites.  Of note, she dropped her platelet count in temporal relationship to receiving Lovenox.  According to the nursing staff, she was started on Protonix 40 mg twice daily last evening.  She has IR oxycodone -5 mg p.o. every 4 hours ordered as needed ordered.   Past Medical History:  Diagnosis Date   Basal cell carcinoma 05/18/2021   nod-mid forehead (MOHS)    History reviewed. No pertinent surgical history.  Prior to Admission medications   Medication Sig Start Date End Date Taking? Authorizing Provider  acetaminophen (TYLENOL) 325 MG tablet Take 650 mg by mouth every 6 (six) hours as needed for moderate pain.   Yes [provider]  Ascorbic Acid (VITAMIN C) 1000 MG tablet Take 1,000 mg by mouth daily.   Yes [provider]  calcium carbonate (TUMS - DOSED IN MG ELEMENTAL CALCIUM) 500 MG chewable tablet Chew 1 tablet by mouth daily.   Yes [provider]  clobetasol ointment (TEMOVATE) 8.29 % Apply 1 application topically 2 (two) times daily.   Yes [provider]  fexofenadine (ALLEGRA) 180 MG tablet Take by mouth.   Yes [provider]  fish oil-omega-3 fatty acids 1000 MG capsule Take 1 g by mouth daily.   Yes [provider]  folic acid (FOLVITE) 1 MG tablet Take 1 mg by mouth daily.   Yes [provider]  levothyroxine (SYNTHROID) 25 MCG tablet  Take 12.5 mcg by mouth daily before breakfast.   Yes [provider]  magnesium oxide (MAG-OX) 400 MG tablet Take 400 mg by mouth daily.   Yes [provider]  oxyCODONE (OXY IR/ROXICODONE) 5 MG immediate release tablet Take 5-10 mg by mouth every 4  (four) hours as needed for severe pain.   Yes [provider]  potassium chloride (KLOR-CON M) 10 MEQ tablet Take 10 mEq by mouth daily.   Yes [provider]  thiamine 250 MG tablet Take 250 mg by mouth daily.   Yes [provider]  torsemide (DEMADEX) 20 MG tablet Take 20 mg by mouth daily.   Yes [provider]  vitamin B-12 (CYANOCOBALAMIN) 250 MCG tablet Take 250 mcg by mouth daily.   Yes [provider]  Vitamin D, Ergocalciferol, (DRISDOL) 1.25 MG (50000 UNIT) CAPS capsule Take 50,000 Units by mouth every 7 (seven) days. Sundays   Yes [provider]  vitamin E 200 UNIT capsule Take 400 Units by mouth daily.   Yes [provider]    Current Facility-Administered Medications  Medication Dose Route Frequency Provider Last Rate Last Admin   Chlorhexidine Gluconate Cloth 2 % PADS 6 each  6 each Topical Daily Barton Dubois, MD   6 each at 12/20/21 0849   lactulose ([place]) 10 GM/15ML solution 20 g  20 g Oral BID Barton Dubois, MD   20 g at 12/20/21 0848   levothyroxine (SYNTHROID) tablet 100 mcg  100 mcg Oral Q0600 Barton Dubois, MD   100 mcg at 12/20/21 0610   MEDLINE mouth rinse  15 mL Mouth Rinse BID Barton Dubois, MD   15 mL at 12/20/21 0849   midodrine (PROAMATINE) tablet 10 mg  10 mg Oral TID WC Barton Dubois, MD   10 mg at 12/20/21 0849   mupirocin ointment (BACTROBAN) 2 %   Nasal BID Barton Dubois, MD   Given at 12/20/21 [phone]   norepinephrine (LEVOPHED) 4mg  in 230mL (0.016 mg/mL) premix infusion  0-40 mcg/min Intravenous Titrated Zierle-Ghosh, Asia B, DO   Paused at 12/20/21 0430   ondansetron (ZOFRAN) tablet 4 mg  4 mg Oral Q6H PRN Barton Dubois, MD       Or   ondansetron [facility]) injection 4 mg  4 mg Intravenous Q6H PRN Barton Dubois, MD   4 mg at 12/20/21 4540   oxyCODONE (Oxy IR/ROXICODONE) immediate release tablet 5 mg  5 mg Oral Q4H PRN Barton Dubois, MD   5 mg at 12/17/21 2354   pantoprazole (PROTONIX)  injection 40 mg  40 mg Intravenous Q12H Zierle-Ghosh, Asia B, DO   40 mg at 12/20/21 0610   sodium chloride flush (NS) 0.9 % injection 10-40 mL  10-40 mL Intracatheter Q12H Barton Dubois, MD   10 mL at 12/20/21 0849   sodium chloride flush (NS) 0.9 % injection 10-40 mL  10-40 mL Intracatheter PRN Barton Dubois, MD       vancomycin (VANCOREADY) IVPB 1250 mg/250 mL  1,250 mg Intravenous Q24H Coffee, Donna Christen, RPH 166.7 mL/hr at 12/19/21 2133 1,250 mg at 12/19/21 2133    Allergies as of 12/15/2021 - Review Complete 06/01/2021  Allergen Reaction Noted   Zosyn [piperacillin sod-tazobactam so] Other (See Comments) 12/15/2021    History reviewed. No pertinent family history.  Social History   Socioeconomic History   Marital status: Married    Spouse name: Not on file   Number of children: Not on file   Years of education: Not on file  Highest education level: Not on file  Occupational History   Not on file  Tobacco Use   Smoking status: Never   Smokeless tobacco: Never  Vaping Use   Vaping Use: Never used  Substance and Sexual Activity   Alcohol use: Never   Drug use: Never   Sexual activity: Not on file  Other Topics Concern   Not on file  Social History Narrative   Not on file   Social Determinants of Health   Financial Resource Strain: Not on file  Food Insecurity: Not on file  Transportation Needs: Not on file  Physical Activity: Not on file  Stress: Not on file  Social Connections: Not on file  Intimate Partner Violence: Not on file    Review of Systems:  As in history of present illness  Physical Exam: Vital signs in last 24 hours: Temp:  [97.8 F (36.6 C)-98.2 F (36.8 C)] 97.8 F (36.6 C) (12/26 0736) Pulse Rate:  [72-91] 79 (12/26 1040) Resp:  [16-27] 19 (12/26 1040) BP: (94-132)/(46-68) 114/46 (12/26 1040) SpO2:  [91 %-98 %] 98 % (12/26 1040) Weight:  [116.4 kg] 116.4 kg (12/26 0507)   General:   Alert,  pleasant and cooperative in NAD.  She really  appears mentally sharp this morning.  She has no asterixis. Lungs:  Clear throughout to auscultation.   No wheezes, crackles, or rhonchi. No acute distress. Heart:  Regular rate and rhythm; no murmurs, clicks, rubs,  or gallops. Abdomen: Moderately distended.  Soft with minimal tenderness.  Ext:  She has 3 to 4+ brawny pelvic and lower extremity edema both of her calfs are dressings-just changed.  Intake/Output from previous day: 12/25 0701 - 12/26 0700 In: 1135.4 [P.O.:840; I.V.:148.1; IV Piggyback:147.3] Out: -  Intake/Output this shift: Total I/O In: 152.4 [P.O.:120; I.V.:32.4] Out: -   Lab Results: Recent Labs    12/19/21 0524 12/20/21 0424  WBC 7.8 8.0  HGB 9.8* 10.4*  HCT 29.2* 31.8*  PLT 101* 116*   BMET Recent Labs    12/18/21 0505 12/19/21 0524  NA 136 136  K 3.7 3.3*  CL 104 103  CO2 24 22  GLUCOSE 107* 138*  BUN 16 14  CREATININE 0.86 0.65  CALCIUM 8.0* 7.9*   LFT Recent Labs    12/19/21 0524  PROT 4.5*  ALBUMIN 2.3*  AST 28  ALT 13  ALKPHOS 26  BILITOT 1.6*   Impression: 55 year old lady with advanced cirrhosis (presumably secondary to hepatitis C genotype Ia-reportedly eradicated years back) presenting with a clinical picture of decompensation secondary to sepsis likely related to bilateral lower extremity cellulitis.  Secondary anasarca/ascites.  Hepatic encephalopathy on admission likely multifactorial in etiology (infection, narcotics and significant constipation).  MELD calculates out to be 15 this morning. It certainly sounds like she did have some blood that came out of her mouth this morning either from coughing or emesis.  I do not believe this represents a significant GI bleed at this time.  She does have portal hypertension and esophageal varices.  She has a history of GERD. Hemoglobin is looking pretty good for her this morning without other evidence of GI bleeding. No prior upper or lower endoscopic evaluation as she describes although it  has been offered in the past.  Recommendations:  I discussed with her the recommendation to have an EGD prior to discharge.  She is not sure she wants to have any such invasive procedures performed.  Continue ICU support, antibiotics.  2 g sodium diet.  Trend H&H with periodic labs.  Continue PPI empirically.  Lactulose titrating to 2-3 semiformed bowel movements daily.  Minimize use of CNS active medications including opioids as much as feasible.    Lactulose titrated to 3-4 semiformed bowel movements daily  Consider initiating diuretic therapy and short term daily infusion of albumin depending on hemodynamic stability, etc. as deemed appropriate per attending.  Will follow along with you while she is here.      Notice:  This dictation was prepared with Dragon dictation along with smaller phrase technology. Any transcriptional errors that result from this process are unintentional and may not be corrected upon review.

## 2021-12-20 NOTE — Progress Notes (Signed)
0400AM Pt was producing dark red vomit. PRN IV Zofran was given. Pt refusing CHG bath and dressing change this morning because movement is making her nauseas. Dr. Dannielle Huh notified.

## 2021-12-20 NOTE — Consult Note (Signed)
Consultation Note Date: 12/20/2021   Patient Name: Kristie Brooks  DOB: 1966-08-14  MRN: [id number]  Age / Sex: 55 y.o., female  PCP: Keane Police, MD Referring Physician: Barton Dubois, MD  Reason for Consultation: Establishing goals of care and Psychosocial/spiritual support  HPI/Patient Profile: 55 y.o. female  with past medical history of cirrhosis, obesity, history of basal cell carcinoma with Mohs to forehead May 2022, seasonal allergy admitted on 12/15/2021 with severe sepsis secondary to cellulitis.   Clinical Assessment and Goals of Care: I have reviewed medical records including EPIC notes, labs and imaging, received report from RN, assessed the patient.  Ms. Kristie, Brooks, is sitting up in the [place] chair in her room.  She greets me, making and somewhat keeping eye contact.  She appears acutely/chronically ill and somewhat frail.  She is alert and oriented, able to make her needs known.  There is no family at bedside at this time.  We meet to discuss diagnosis prognosis, GOC, EOL wishes, disposition and options.  I introduced Palliative Medicine as specialized medical care for people living with serious illness. It focuses on providing relief from the symptoms and stress of a serious illness. The goal is to improve quality of life for both the patient and the family.  We discussed a brief life review of the patient.  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  She is married to Ensign for 16 years.  They have no children.  Her husband has an adult daughter who lives in [place].  We then focused on their current illness.  We talk in detail about her cellulitis and the treatment plan.  We also talked about her history of cirrhosis.  She tells me that she was evaluated by GI in [place] quite  some time ago but has not had follow-ups with GI service.  She tells me that she was badly impacted upon admission but felt much improved after having a soapsuds enema and bowel movement.  We review selected labs and images, in particular we spoke in detail about her CT abdomen.  She shares that she has never had tap for ascites.  We talked about healthcare power of attorney and CODE STATUS, see below.  Advanced directives, concepts specific to code status, artifical feeding and hydration, and rehospitalization were considered and discussed.  [name] tells me that she would want full scope/full code.  She would accept trach/PEG if needed.  Discussed the importance of continued conversation with patient and the medical providers regarding overall plan of care and treatment options, ensuring decisions are within the context of the patients values and GOCs.  Questions and concerns were addressed. The patient was encouraged to call with questions or concerns.  PMT will continue to support holistically.  Conference with attending, bedside nursing staff, transition of care team related to patient condition, needs, goals of care, disposition.   HCPOA  NEXT OF  KIN -[name] names her husband of 16 years, Kristie Brooks, as her healthcare surrogate.  She tells me that she has no children.    SUMMARY OF RECOMMENDATIONS   At this point continue full scope/full code Time for outcomes Anticipate return to [facility] for short-term rehab  Code Status/Advance Care Planning: Full code -  She tells me that she would want full scope/full code.  She would accept PEG/trach if needed.  Symptom Management:  Per hospitalist, no additional needs at this time.  Palliative Prophylaxis:  Frequent Pain Assessment and Palliative Wound Care  Additional Recommendations (Limitations, Scope, Preferences): Full Scope Treatment  Psycho-social/Spiritual:  Desire for further Chaplaincy support:no Additional  Recommendations: Caregiving  Support/Resources  Prognosis:  Unable to determine, based on outcomes.  Guarded at this point.  Discharge Planning:  To be determined, based on outcomes.  At this point anticipate returning to [facility] for short-term rehab       Primary Diagnoses: Present on Admission:  Sepsis [facility])   I have reviewed the medical record, interviewed the patient and family, and examined the patient. The following aspects are pertinent.  Past Medical History:  Diagnosis Date   Basal cell carcinoma 05/18/2021   nod-mid forehead (MOHS)   Social History   Socioeconomic History   Marital status: Married    Spouse name: Not on file   Number of children: Not on file   Years of education: Not on file   Highest education level: Not on file  Occupational History   Not on file  Tobacco Use   Smoking status: Never   Smokeless tobacco: Never  Vaping Use   Vaping Use: Never used  Substance and Sexual Activity   Alcohol use: Never   Drug use: Never   Sexual activity: Not on file  Other Topics Concern   Not on file  Social History Narrative   Not on file   Social Determinants of Health   Financial Resource Strain: Not on file  Food Insecurity: Not on file  Transportation Needs: Not on file  Physical Activity: Not on file  Stress: Not on file  Social Connections: Not on file   History reviewed. No pertinent family history. Scheduled Meds:  Chlorhexidine Gluconate Cloth  6 each Topical Daily   [START ON 12/21/2021] lactulose  20 g Oral Daily   levothyroxine  100 mcg Oral Q0600   mouth rinse  15 mL Mouth Rinse BID   midodrine  10 mg Oral TID WC   mupirocin ointment   Nasal BID   pantoprazole (PROTONIX) IV  40 mg Intravenous Q12H   sodium chloride flush  10-40 mL Intracatheter Q12H   Continuous Infusions:  vancomycin 1,250 mg (12/19/21 2133)   PRN Meds:.ondansetron **OR** ondansetron (ZOFRAN) IV, oxyCODONE, sodium chloride flush Medications  Prior to Admission:  Prior to Admission medications   Medication Sig Start Date End Date Taking? Authorizing Provider  acetaminophen (TYLENOL) 325 MG tablet Take 650 mg by mouth every 6 (six) hours as needed for moderate pain.   Yes [provider]  Ascorbic Acid (VITAMIN C) 1000 MG tablet Take 1,000 mg by mouth daily.   Yes [provider]  calcium carbonate (TUMS - DOSED IN MG ELEMENTAL CALCIUM) 500 MG chewable tablet Chew 1 tablet by mouth daily.   Yes [provider]  clobetasol ointment (TEMOVATE) 9.98 % Apply 1 application topically 2 (two) times daily.   Yes [provider]  fexofenadine (ALLEGRA) 180 MG tablet Take by mouth.  Yes [provider]  fish oil-omega-3 fatty acids 1000 MG capsule Take 1 g by mouth daily.   Yes [provider]  folic acid (FOLVITE) 1 MG tablet Take 1 mg by mouth daily.   Yes [provider]  levothyroxine (SYNTHROID) 25 MCG tablet Take 12.5 mcg by mouth daily before breakfast.   Yes [provider]  magnesium oxide (MAG-OX) 400 MG tablet Take 400 mg by mouth daily.   Yes [provider]  oxyCODONE (OXY IR/ROXICODONE) 5 MG immediate release tablet Take 5-10 mg by mouth every 4 (four) hours as needed for severe pain.   Yes [provider]  potassium chloride (KLOR-CON M) 10 MEQ tablet Take 10 mEq by mouth daily.   Yes [provider]  thiamine 250 MG tablet Take 250 mg by mouth daily.   Yes [provider]  torsemide (DEMADEX) 20 MG tablet Take 20 mg by mouth daily.   Yes [provider]  vitamin B-12 (CYANOCOBALAMIN) 250 MCG tablet Take 250 mcg by mouth daily.   Yes [provider]  Vitamin D, Ergocalciferol, (DRISDOL) 1.25 MG (50000 UNIT) CAPS capsule Take 50,000 Units by mouth every 7 (seven) days. Sundays   Yes [provider]  vitamin E 200 UNIT capsule Take 400 Units by mouth daily.   Yes [provider]   Allergies   Allergen Reactions   Dakin's [Sodium Hypochlorite] Dermatitis   Zosyn [Piperacillin Sod-Tazobactam So] Other (See Comments)    Blood count dropped   Review of Systems  Unable to perform ROS: Acuity of condition   Physical Exam Vitals and nursing note reviewed.  Constitutional:      General: She is not in acute distress.    Appearance: She is obese. She is ill-appearing.  Cardiovascular:     Rate and Rhythm: Normal rate.  Pulmonary:     Effort: Pulmonary effort is normal. No respiratory distress.  Musculoskeletal:        General: Swelling present.  Skin:    General: Skin is warm and dry.  Neurological:     Mental Status: She is alert and oriented to person, place, and time.    Vital Signs: BP (!) 106/58    Pulse 87    Temp (!) 97.4 F (36.3 C) (Oral)    Resp (!) 21    Ht 5\' 5"  (1.651 m)    Wt 116.4 kg    SpO2 97%    BMI 42.70 kg/m  Pain Scale: 0-10 POSS *See Group Information*: 1-Acceptable,Awake and alert Pain Score: 0-No pain   SpO2: SpO2: 97 % O2 Device:SpO2: 97 % O2 Flow Rate: .O2 Flow Rate (L/min): 3 L/min  IO: Intake/output summary:  Intake/Output Summary (Last 24 hours) at 12/20/2021 1337 Last data filed at 12/20/2021 7829 Gross per 24 hour  Intake 212.76 ml  Output --  Net 212.76 ml    LBM: Last BM Date: 12/20/21 Baseline Weight: Weight: 131.5 kg Most recent weight: Weight: 116.4 kg     Palliative Assessment/Data:   Flowsheet Rows    Flowsheet Row Most Recent Value  Intake Tab   Referral Department Hospitalist  Unit at Time of Referral Cardiac/Telemetry Unit  Palliative Care Primary Diagnosis Sepsis/Infectious Disease  Date Notified 12/16/21  Palliative Care Type New Palliative care  Reason for referral Clarify Goals of Care  Date of Admission 12/15/21  Date first seen by Palliative Care 12/20/21  # of days Palliative referral response time 4 Day(s)  # of days IP prior  to Palliative referral 1  Clinical Assessment   Palliative Performance  Scale Score 40%  Pain Max last 24 hours Not able to report  Pain Min Last 24 hours Not able to report  Dyspnea Max Last 24 Hours Not able to report  Dyspnea Min Last 24 hours Not able to report  Psychosocial & Spiritual Assessment   Palliative Care Outcomes        Time In: 1020 Time Out: 1130 Time Total: 70 minutes  Greater than 50%  of this time was spent counseling and coordinating care related to the above assessment and plan.  Signed by: Drue Novel, NP   Please contact Palliative Medicine Team phone at [phone] for questions and concerns.  For individual provider: See Shea Evans

## 2021-12-20 NOTE — Evaluation (Signed)
Physical Therapy Evaluation Patient Details Name: Kristie Brooks MRN: [id number] DOB: 12-Dec-1966 Today's Date: 12/20/2021  History of Present Illness  Kristie Brooks  is a 55 y.o. female, with history of cirrhosis, presents ED for chief complaint of what she says is fluid overload.  What was reported by EMS states that she was sent in for fever and confusion.  Does report that her husband said she was confused that she has been thinks that she was.  She reports that she had fluid overload in her legs and thighs for 3 or 4 days.  She reports they have been weeping for 3 or 4 days.  She is also had erythema for 3 or 4 days.  She reports that all the symptoms started the same time.   Clinical Impression   Patient demonstrates generalized weakness and reduced functional independence and mobility requiring increased levels of physical assistance for ADL and functional mobility.  Continued PT services indicated while hospitalized to improve independence with bed mobility, transfers, and ambulation.  Recommend continued physical rehabilitation in SNF venue to progress strength and independence to facilitate safe transition to home environment       Recommendations for follow up therapy are one component of a multi-disciplinary discharge planning process, led by the attending physician.  Recommendations may be updated based on patient status, additional functional criteria and insurance authorization.  Follow Up Recommendations Skilled nursing-short term rehab (<3 hours/day)    Assistance Recommended at Discharge Intermittent Supervision/Assistance  Functional Status Assessment Patient has had a recent decline in their functional status and demonstrates the ability to make significant improvements in function in a reasonable and predictable amount of time.  Equipment Recommendations  None recommended by PT    Recommendations for Other Services       Precautions / Restrictions  Precautions Precautions: Fall Restrictions Weight Bearing Restrictions: No      Mobility  Bed Mobility               General bed mobility comments: Not completed Patient Response: Cooperative  Transfers Overall transfer level: Needs assistance   Transfers: Sit to/from Stand;Bed to chair/wheelchair/BSC Sit to Stand: Mod assist Stand pivot transfers: Mod assist         General transfer comment: Defer to PT note    Ambulation/Gait Ambulation/Gait assistance: Min assist Gait Distance (Feet): 5 Feet Assistive device: Rolling walker (2 wheels) Gait Pattern/deviations: Wide base of support       General Gait Details: heavy, belabored steps  Stairs            Wheelchair Mobility    Modified Rankin (Stroke Patients Only)       Balance Overall balance assessment: Mild deficits observed, not formally tested                                           Pertinent Vitals/Pain Pain Assessment: No/denies pain    Home Living Family/patient expects to be discharged to:: Skilled nursing facility                   Additional Comments: Pt has been at [facility] since 09/2021, is receiving rehab services with plans to return home.    Prior Function Prior Level of Function : Needs assist       Physical Assist : Mobility (physical);ADLs (physical) Mobility (physical): Transfers;Gait;Stairs;Bed mobility ADLs (physical): Bathing;Dressing;Toileting;IADLs Mobility Comments: Pt  reports CNAs at [facility] assist with bed mobility and ADLs, completes functional mobility with rehab staff. ADLs Comments: CNAs assist with ADLs, she is able to perform peri-care, feeding, and grooming tasks     Hand Dominance   Dominant Hand: Right    Extremity/Trunk Assessment   Upper Extremity Assessment Upper Extremity Assessment: Generalized weakness    Lower Extremity Assessment Lower Extremity Assessment: Generalized weakness    Cervical /  Trunk Assessment Cervical / Trunk Assessment: Normal  Communication   Communication: No difficulties  Cognition Arousal/Alertness: Awake/alert Behavior During Therapy: WFL for tasks assessed/performed Overall Cognitive Status: Within Functional Limits for tasks assessed                                          General Comments      Exercises     Assessment/Plan    PT Assessment Patient needs continued PT services  PT Problem List Decreased strength;Decreased activity tolerance;Decreased balance;Decreased mobility;Obesity       PT Treatment Interventions DME instruction;Gait training;Stair training;Functional mobility training;Therapeutic activities;Patient/family education;Neuromuscular re-education;Balance training;Wheelchair mobility training;Therapeutic exercise;Manual techniques    PT Goals (Current goals can be found in the Care Plan section)  Acute Rehab PT Goals Patient Stated Goal: Return home when able PT Goal Formulation: With patient Time For Goal Achievement: 12/27/21 Potential to Achieve Goals: Good    Frequency Min 3X/week   Barriers to discharge        Co-evaluation PT/OT/SLP Co-Evaluation/Treatment: Yes Reason for Co-Treatment: Complexity of the patient's impairments (multi-system involvement);For patient/therapist safety PT goals addressed during session: Mobility/safety with mobility OT goals addressed during session: ADL's and self-care       AM-PAC PT "6 Clicks" Mobility  Outcome Measure     Help needed moving to and from a bed to a chair (including a wheelchair)?: A Lot Help needed standing up from a chair using your arms (e.g., wheelchair or bedside chair)?: A Lot Help needed to walk in hospital room?: A Lot Help needed climbing 3-5 steps with a railing? : A Lot 6 Click Score: 8    End of Session   Activity Tolerance: Patient tolerated treatment well;Patient limited by fatigue Patient left: in chair;with call bell/phone  within reach Nurse Communication: Mobility status PT Visit Diagnosis: Unsteadiness on feet (R26.81);Muscle weakness (generalized) (M62.81);Difficulty in walking, not elsewhere classified (R26.2)    Time: 1962-2297 PT Time Calculation (min) (ACUTE ONLY): 33 min   Charges:   PT Evaluation $PT Eval Moderate Complexity: 1 Mod PT Treatments $Therapeutic Activity: 8-22 mins       11:52 AM, 12/20/21 M. Sherlyn Lees, PT, DPT Physical Therapist- [place] Office Number: [phone]

## 2021-12-20 NOTE — Evaluation (Signed)
Occupational Therapy Evaluation Patient Details Name: Kristie Brooks MRN: [id number] DOB: Jun 26, 1966 Today's Date: 12/20/2021   History of Present Illness Kristie Brooks  is a 55 y.o. female, with history of cirrhosis, presents ED for chief complaint of what she says is fluid overload.  What was reported by EMS states that she was sent in for fever and confusion.  Does report that her husband said she was confused that she has been thinks that she was.  She reports that she had fluid overload in her legs and thighs for 3 or 4 days.  She reports they have been weeping for 3 or 4 days.  She is also had erythema for 3 or 4 days.  She reports that all the symptoms started the same time.   Clinical Impression   Pt agreeable to OT/PT co-evaluation this am, received at end of bath while seated on BSC. Pt completing peri-care independently while use AE. Pt requiring assistance with ADLs LB>UB, limited standing tolerance impacting ability to complete tasks requiring sustained standing. Recommend return to SNF for continued rehab services to improve safety and independence in ADL completion and mobility tasks.       Recommendations for follow up therapy are one component of a multi-disciplinary discharge planning process, led by the attending physician.  Recommendations may be updated based on patient status, additional functional criteria and insurance authorization.   Follow Up Recommendations  Skilled nursing-short term rehab (<3 hours/day)    Assistance Recommended at Discharge Frequent or constant Supervision/Assistance  Functional Status Assessment  Patient has had a recent decline in their functional status and/or demonstrates limited ability to make significant improvements in function in a reasonable and predictable amount of time  Equipment Recommendations  None recommended by OT       Precautions / Restrictions Precautions Precautions: Fall Restrictions Weight Bearing Restrictions:  No      Mobility Bed Mobility               General bed mobility comments: Not completed    Transfers Overall transfer level: Needs assistance                 General transfer comment: Defer to PT note          ADL either performed or assessed with clinical judgement   ADL Overall ADL's : Needs assistance/impaired Eating/Feeding: Modified independent;Sitting   Grooming: Set up;Sitting           Upper Body Dressing : Minimal assistance;Sitting   Lower Body Dressing: Maximal assistance;Sitting/lateral leans;Sit to/from stand   Toilet Transfer: Min guard;Stand-pivot;Rolling walker (2 wheels);BSC/3in1 [employer] forces technical [employer] Details (indicate cue type and reason): Pt completing toilet transfer tasks with assist to stand from small BSC, then was able to complete tasks with min guard Toileting- Clothing Manipulation and Hygiene: Supervision/safety;Sitting/lateral lean Toileting - Clothing Manipulation Details (indicate cue type and reason): Pt using toilet wand to complete task, independent in set-up and use     Functional mobility during ADLs: Min guard;Rolling walker (2 wheels) General ADL Comments: Pt requiring assistance for ADLs, limited activity tolerance     Vision Baseline Vision/History: 0 No visual deficits Ability to See in Adequate Light: 0 Adequate Patient Visual Report: No change from baseline Vision Assessment?: No apparent visual deficits            Pertinent Vitals/Pain Pain Assessment: No/denies pain     Hand Dominance Right   Extremity/Trunk Assessment Upper Extremity Assessment Upper Extremity Assessment: Generalized weakness   Lower  Extremity Assessment Lower Extremity Assessment: Defer to PT evaluation   Cervical / Trunk Assessment Cervical / Trunk Assessment: Normal   Communication Communication Communication: No difficulties   Cognition Arousal/Alertness: Awake/alert Behavior During Therapy: WFL for tasks  assessed/performed Overall Cognitive Status: Within Functional Limits for tasks assessed                                                  Home Living Family/patient expects to be discharged to:: Skilled nursing facility                                 Additional Comments: Pt has been at [facility] since 09/2021, is receiving rehab services with plans to return home.      Prior Functioning/Environment Prior Level of Function : Needs assist       Physical Assist : Mobility (physical);ADLs (physical) Mobility (physical): Transfers;Gait;Stairs;Bed mobility ADLs (physical): Bathing;Dressing;Toileting;IADLs Mobility Comments: Pt reports CNAs at [facility] assist with bed mobility and ADLs, completes functional mobility with rehab staff. ADLs Comments: CNAs assist with ADLs, she is able to perform peri-care, feeding, and grooming tasks        OT Problem List: Decreased strength;Decreased activity tolerance;Impaired balance (sitting and/or standing);Decreased safety awareness;Decreased knowledge of use of DME or AE      OT Treatment/Interventions: Self-care/ADL training;Therapeutic exercise;DME and/or AE instruction;Therapeutic activities;Patient/family education    OT Goals(Current goals can be found in the care plan section) Acute Rehab OT Goals Patient Stated Goal: To feel better and go home OT Goal Formulation: With patient Time For Goal Achievement: 01/03/22 Potential to Achieve Goals: Fair  OT Frequency: Min 1X/week   Barriers to D/C: Decreased caregiver support  Pt reports she doesn't have any help, husband might help get groceries       Co-evaluation PT/OT/SLP Co-Evaluation/Treatment: Yes Reason for Co-Treatment: Complexity of the patient's impairments (multi-system involvement)   OT goals addressed during session: ADL's and self-care;Proper use of [employer] equipment and DME         End of Session Equipment Utilized During  Treatment: Rolling walker (2 wheels);Oxygen Nurse Communication: Mobility status;Other (comment) (tele leads off)  Activity Tolerance: Patient tolerated treatment well Patient left: in chair;with call bell/phone within reach  OT Visit Diagnosis: Muscle weakness (generalized) (M62.81)                Time: 4917-9150 OT Time Calculation (min): 33 min Charges:  OT General Charges $OT Visit: 1 Visit OT Evaluation $OT Eval Low Complexity: [place], OTR/L  [phone] 12/20/2021, 10:09 AM

## 2021-12-20 NOTE — Plan of Care (Signed)
°  Problem: Acute Rehab PT Goals(only PT should resolve) Goal: Patient Will Transfer Sit To/From Stand Outcome: Progressing Flowsheets (Taken 12/20/2021 1153) Patient will transfer sit to/from stand: with supervision Goal: Pt Will Transfer Bed To Chair/Chair To Bed Outcome: Progressing Flowsheets (Taken 12/20/2021 1153) Pt will Transfer Bed to Chair/Chair to Bed: with supervision Goal: Pt Will Ambulate Outcome: Progressing Flowsheets (Taken 12/20/2021 1153) Pt will Ambulate:  50 feet  with supervision  with rolling walker   11:54 AM, 12/20/21 M. Sherlyn Lees, PT, DPT Physical Therapist- [place] Office Number: [phone]

## 2021-12-21 ENCOUNTER — Encounter (HOSPITAL_COMMUNITY): Payer: Self-pay | Admitting: Family Medicine

## 2021-12-21 ENCOUNTER — Inpatient Hospital Stay (HOSPITAL_COMMUNITY): Payer: Managed Care, Other (non HMO)

## 2021-12-21 DIAGNOSIS — L89322 Pressure ulcer of left buttock, stage 2: Secondary | ICD-10-CM | POA: Diagnosis not present

## 2021-12-21 DIAGNOSIS — R188 Other ascites: Secondary | ICD-10-CM

## 2021-12-21 DIAGNOSIS — R579 Shock, unspecified: Secondary | ICD-10-CM | POA: Diagnosis not present

## 2021-12-21 DIAGNOSIS — A419 Sepsis, unspecified organism: Secondary | ICD-10-CM | POA: Diagnosis not present

## 2021-12-21 LAB — BODY FLUID CELL COUNT WITH DIFFERENTIAL
Eos, Fluid: 0 %
Lymphs, Fluid: 38 %
Monocyte-Macrophage-Serous Fluid: 19 % — ABNORMAL LOW (ref 50–90)
Neutrophil Count, Fluid: 43 % — ABNORMAL HIGH (ref 0–25)
Total Nucleated Cell Count, Fluid: 62 cu mm (ref 0–1000)

## 2021-12-21 LAB — CBC WITH DIFFERENTIAL/PLATELET
Abs Immature Granulocytes: 0.27 10*3/uL — ABNORMAL HIGH (ref 0.00–0.07)
Basophils Absolute: 0.1 10*3/uL (ref 0.0–0.1)
Basophils Relative: 1 %
Eosinophils Absolute: 0.3 10*3/uL (ref 0.0–0.5)
Eosinophils Relative: 3 %
HCT: 33.1 % — ABNORMAL LOW (ref 36.0–46.0)
Hemoglobin: 10.9 g/dL — ABNORMAL LOW (ref 12.0–15.0)
Immature Granulocytes: 3 %
Lymphocytes Relative: 12 %
Lymphs Abs: 1.1 10*3/uL (ref 0.7–4.0)
MCH: 30.5 pg (ref 26.0–34.0)
MCHC: 32.9 g/dL (ref 30.0–36.0)
MCV: 92.7 fL (ref 80.0–100.0)
Monocytes Absolute: 1 10*3/uL (ref 0.1–1.0)
Monocytes Relative: 11 %
Neutro Abs: 6.4 10*3/uL (ref 1.7–7.7)
Neutrophils Relative %: 70 %
Platelets: 124 10*3/uL — ABNORMAL LOW (ref 150–400)
RBC: 3.57 MIL/uL — ABNORMAL LOW (ref 3.87–5.11)
RDW: 18.1 % — ABNORMAL HIGH (ref 11.5–15.5)
WBC: 9.1 10*3/uL (ref 4.0–10.5)
nRBC: 0 % (ref 0.0–0.2)

## 2021-12-21 LAB — PROTIME-INR
INR: 1.3 — ABNORMAL HIGH (ref 0.8–1.2)
Prothrombin Time: 16.2 seconds — ABNORMAL HIGH (ref 11.4–15.2)

## 2021-12-21 LAB — COMPREHENSIVE METABOLIC PANEL
ALT: 15 U/L (ref 0–44)
AST: 38 U/L (ref 15–41)
Albumin: 2.3 g/dL — ABNORMAL LOW (ref 3.5–5.0)
Alkaline Phosphatase: 101 U/L (ref 38–126)
Anion gap: 8 (ref 5–15)
BUN: 14 mg/dL (ref 6–20)
CO2: 24 mmol/L (ref 22–32)
Calcium: 8 mg/dL — ABNORMAL LOW (ref 8.9–10.3)
Chloride: 103 mmol/L (ref 98–111)
Creatinine, Ser: 0.61 mg/dL (ref 0.44–1.00)
GFR, Estimated: >60 mL/min (ref >60)
Glucose, Bld: 116 mg/dL — ABNORMAL HIGH (ref 70–99)
Potassium: 3.4 mmol/L — ABNORMAL LOW (ref 3.5–5.1)
Sodium: 135 mmol/L (ref 135–145)
Total Bilirubin: 1.9 mg/dL — ABNORMAL HIGH (ref 0.3–1.2)
Total Protein: 4.8 g/dL — ABNORMAL LOW (ref 6.5–8.1)

## 2021-12-21 LAB — GRAM STAIN: Gram Stain: NONE SEEN

## 2021-12-21 MED ORDER — POTASSIUM CHLORIDE CRYS ER 20 MEQ PO TBCR
40.0000 meq | EXTENDED_RELEASE_TABLET | Freq: Once | ORAL | Status: AC
  Administered 2021-12-21: 13:00:00 40 meq via ORAL
  Filled 2021-12-21: qty 2

## 2021-12-21 MED ORDER — ZINC OXIDE 40 % EX OINT
TOPICAL_OINTMENT | Freq: Three times a day (TID) | CUTANEOUS | Status: DC | PRN
  Filled 2021-12-21: qty 57

## 2021-12-21 MED ORDER — ALBUMIN HUMAN 25 % IV SOLN
50.0000 g | Freq: Three times a day (TID) | INTRAVENOUS | Status: AC
  Administered 2021-12-21 – 2021-12-23 (×6): 50 g via INTRAVENOUS
  Filled 2021-12-21 (×6): qty 200

## 2021-12-21 NOTE — Procedures (Signed)
INDICATION: Ascites.  Hepatitis-C.  Cirrhosis. EXAM: ULTRASOUND GUIDED  PARACENTESIS GRIP-IR: Category: Fluids  Subcategory: Paracentesis  Follow-Up: None  MEDICATIONS: None. COMPLICATIONS: None immediate. PROCEDURE: Informed written consent was obtained from the patient after a discussion of the risks, benefits and alternatives to treatment. A timeout was performed prior to the initiation of the procedure.  Initial ultrasound scanning demonstrates a large amount of ascites within the right lower abdominal quadrant. The right lower abdomen was prepped and draped in the usual sterile fashion. 1% lidocaine  was used for local anesthesia.  An ultrasound image was saved for documentation purposes.   Following this, a Yueh catheter was introduced.   The paracentesis was performed. The catheter was removed and a dressing was applied. The patient tolerated the procedure well without immediate post procedural complication.  FINDINGS: A total of approximately 4 L of straw-colored fluid was removed. Samples were sent to the laboratory as requested by the clinical team. IMPRESSION:  Successful ultrasound-guided paracentesis yielding 4 liters of peritoneal fluid.

## 2021-12-21 NOTE — Progress Notes (Signed)
PROGRESS NOTE    Kristie Brooks  WYO:[id number] DOB: July 06, 1966 DOA: 12/15/2021 PCP: Keane Police, MD    Chief Complaint  Patient presents with   Leg Swelling    Brief Narrative:  As per H&P written by Dr. Clearence Ped on 12/16/21  Kristie Brooks  is a 55 y.o. female, with history of cirrhosis, presents ED for chief complaint of what she says is fluid overload.  What was reported by EMS states that she was sent in for fever and confusion.  Does report that her husband said she was confused that she has been thinks that she was.  She reports that she had fluid overload in her legs and thighs for 3 or 4 days.  She reports they have been weeping for 3 or 4 days.  She is also had erythema for 3 or 4 days.  She reports that all the symptoms started the same time.  She had fevers but she is unsure how high.  She denies any confusion.  She denies any cough, dysuria, chest pain, and vomiting.  She does report nausea intermittently since September.  She denies any diarrhea.  Patient reports no change in her normal abdominal pain.  She has had abdominal pain for a long time per her report.  Patient reports no purulent drainage from her bilateral legs.  Bilateral legs are wrapped by the ED at the time of my exam.  She does not have a history of an echo on file with our system.  Patient has no further complaints at this time.   Patient does not smoke, does not drink, does not use illicit drugs.  Patient is vaccinated for COVID.  Patient is full code.  Patient used to be a Marine scientist.   In the ED Temp 99.2, heart rate 100-118, respiratory 17-27, blood pressure at admission 84/34 but as low as systolic in the 77A Troponin 6, 7 Elevated white blood cell count 15.9, hemoglobin 11.3, platelets 126 Chemistry panel is unremarkable Albumin 1.9 Negative respiratory panel Blood culture pending CT pelvis shows marked diffuse subcu body wall edema with large volume ascites.  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 Together these findings are most in keeping with changes of portal venous hypertension Chest x-ray shows low lung volumes with mild to moderate severity bibasilar atelectasis and/or infiltrate with small left pleural effusion Patient was given cefepime, vancomycin however 3 L bolus Cintron was discussed with patient and patient declined.  She reports she still is 1 to be full code, but does not want a central line at this time.  Options for albumin discussed with patient.  Patient accepts albumin. Admission requested for possible sepsis secondary cellulitis   Assessment & Plan: 1-severe sepsis secondary to cellulitis -Bilateral lower extremity cellulitis appreciated -Patient met criteria at time of admission with tachycardia, elevated respiratory rate, elevated white blood cell count, low blood pressure, lactic acid 2.3 and also on presentation confusion as part of organ dysfunction. -Currently afebrile, mentation has significantly improved and back to baseline. -Continue current antibiotics and follow culture results. -Patient denies chest pain, no nausea or vomiting currently; but experienced dark emesis event overnight.  -Ongoing lower extremity discomfort still present; overall erythematous changes and edema continue to improve slowly. -Continue to follow recommendations by wound care service.  2-acute metabolic encephalopathy -In the setting of sepsis -Treatment as mentioned above -Ammonia level within normal limit -Minimize the use of medication that can alter sedation. -Continue providing constant reorientation -Patient mentation  back to baseline.  3-history of cirrhosis with ascites/hypoalbuminemia -Continue low-sodium diet, daily weights and strict I's and O's.   -Planning to initiate diuretic therapy as per GI recommendations.   -Patient has received albumin infusion with some improvement in blood pressure. -GI recommending paracentesis  for fluid analysis (if not too much fluid to be taken).  4-dermatitis lower extremity edema -Continue dressing care and wound care recommendations. -Continue current IV antibiotics. -Pharmacy to assist with vancomycin level and dosing.  5-hypothyroidism -TSH elevated -Synthroid dose has been started; patient initially refusing medication unrelated and acceptance to take. -Plan is to repeat thyroid panel in 8-12 weeks with further adjustment to her Synthroid regimen at that time.  6-class III obesity -Portion control and low calorie diet recommended. -Body mass index is 42.08 kg/m.  7-hypotension -Low blood pressure running at baseline in the setting of cirrhosis: Acute infection contributing and playing a role in her presentation. -Blood pressure overall improved and off pressors. -Continue adjusted dose of midodrine 3 times a day. -Planning diuretics therapy along with recommendation by GI service.  8-constipation -Continue the use of lactulose. -Reports moving bowels; no hematochezia or melena.  9-chronic respiratory failure with hypoxia -Patient reports using 3-4 L nasal cannula supplementation at least for the last couple of months prior to admission. -Currently 4 L in place with good O2 sat -Continue the use of IS and flutter valve.  10-dark hematemesis -patient reported never had EGD in the past; after discussing with GI here, she is declining EGD eval. -concerning for gastritis and ? Varices given hx of cirrhosis -Hgb has remained stable -not further episode of vomiting; reports mild nausea in the morning. -continue PPI -Follow-up with GIs recommendation.   DVT prophylaxis: Heparin Code Status: Full code Family Communication: No family at bedside. Disposition:   Status is: Inpatient    Consultants:  IR GI  Procedures:  See below for x-ray reports.  Antimicrobials:  Vancomycin   Subjective: No fever, no chest pain, stable blood pressure and vital  signs without the need of pressors.  Patient reports no shortness of breath.  Good saturation on chronic supplementation.  Feeling slightly nauseated and complaining of abdominal pain/distention.  No further episode of vomiting.  Objective: Vitals:   12/20/21 2103 12/21/21 0200 12/21/21 0500 12/21/21 0700  BP: 112/65 109/63 (!) 107/58   Pulse: 82 80 82   Resp: 19 20 18    Temp: 98.2 F (36.8 C) 98 F (36.7 C) 98 F (36.7 C)   TempSrc: Oral Oral Oral   SpO2: 93% 95% 93%   Weight:    114.7 kg  Height:        Intake/Output Summary (Last 24 hours) at 12/21/2021 1140 Last data filed at 12/21/2021 0900 Gross per 24 hour  Intake 240 ml  Output 301 ml  Net -61 ml   Filed Weights   12/19/21 0500 12/20/21 0507 12/21/21 0700  Weight: 114.4 kg 116.4 kg 114.7 kg    Examination: General exam: Alert, awake, oriented x 3; no chest pain, no fever, no further episode of vomiting.  Reports mild nausea earlier today.  Continues to demonstrate positive anasarca and expressed discomfort around her umbilical bottom. Respiratory system: Clear to auscultation. Respiratory effort normal. Cardiovascular system:RRR. No murmurs, rubs, gallops. Gastrointestinal system: Abdomen is mildly distended, positive fluid wave; tender to deep palpation in midepigastric area around umbilicus.  Positive bowel sounds.   Central nervous system: Alert and oriented. No focal neurological deficits. Extremities: 3+ edema appreciated bilaterally, no cyanosis  or clubbing.  Clean dressings in place Skin: No petechiae.  Appreciated dermatitis changes, redness swelling and weeping process in her legs.  Stage II pressure injury in her left buttocks present at time of admission.  No signs of superimposed with patient. Psychiatry: Judgement and insight appear normal. Mood & affect appropriate.   Data Reviewed: I have personally reviewed following labs and imaging studies  CBC: Recent Labs  Lab 12/15/21 2215 12/17/21 0623  12/19/21 0524 12/20/21 0424 12/21/21 1021  WBC 15.9* 9.8 7.8 8.0 9.1  NEUTROABS 14.2* 7.6  --   --  6.4  HGB 11.3* 8.8* 9.8* 10.4* 10.9*  HCT 34.9* 26.0* 29.2* 31.8* 33.1*  MCV 92.3 92.5 91.8 90.9 92.7  PLT 126* 105* 101* 116* 124*    Basic Metabolic Panel: Recent Labs  Lab 12/16/21 2002 12/17/21 0443 12/18/21 0505 12/19/21 0524 12/21/21 1021  NA 134* 135 136 136 135  K 3.4* 3.1* 3.7 3.3* 3.4*  CL 101 102 104 103 103  CO2 23 24 24 22 24   GLUCOSE 157* 93 107* 138* 116*  BUN 16 17 16 14 14   CREATININE 1.05* 1.03* 0.86 0.65 0.61  CALCIUM 7.9* 8.0* 8.0* 7.9* 8.0*  MG  --  1.9 2.0  --   --     GFR: Estimated Creatinine Clearance: 99.3 mL/min (by C-G formula based on SCr of 0.61 mg/dL).  Liver Function Tests: Recent Labs  Lab 12/15/21 2215 12/16/21 2002 12/17/21 0443 12/19/21 0524 12/21/21 1021  AST 34 27 26 28  38  ALT 15 12 11 13 15   ALKPHOS 103 63 68 91 101  BILITOT 1.8* 1.7* 2.0* 1.6* 1.9*  PROT 5.1* 4.9* 4.8* 4.5* 4.8*  ALBUMIN 1.9* 2.9* 2.7* 2.3* 2.3*    CBG: No results for input(s): GLUCAP in the last 168 hours.   Recent Results (from the past 240 hour(s))  Resp Panel by RT-PCR (Flu A&B, Covid) Nasopharyngeal Swab     Status: None   Collection Time: 12/15/21  9:33 PM   Specimen: Nasopharyngeal Swab; Nasopharyngeal(NP) swabs in vial transport medium  Result Value Ref Range Status   SARS Coronavirus 2 by RT PCR NEGATIVE NEGATIVE Final    Comment: (NOTE) SARS-CoV-2 target nucleic acids are NOT DETECTED.  The SARS-CoV-2 RNA is generally detectable in upper respiratory specimens during the acute phase of infection. The lowest concentration of SARS-CoV-2 viral copies this assay can detect is 138 copies/mL. A negative result does not preclude SARS-Cov-2 infection and should not be used as the sole basis for treatment or other patient management decisions. A negative result may occur with  improper specimen collection/handling, submission of specimen  other than nasopharyngeal swab, presence of viral mutation(s) within the areas targeted by this assay, and inadequate number of viral copies(<138 copies/mL). A negative result must be combined with clinical observations, patient history, and epidemiological information. The expected result is Negative.  Fact Sheet for Patients:  EntrepreneurPulse.com.au  Fact Sheet for Healthcare Providers:  IncredibleEmployment.be  This test is no t yet approved or cleared by the [place] FDA and  has been authorized for detection and/or diagnosis of SARS-CoV-2 by FDA under an Emergency Use Authorization (EUA). This EUA will remain  in effect (meaning this test can be used) for the duration of the COVID-19 declaration under Section 564(b)(1) of the Act, 21 U.S.C.section 360bbb-3(b)(1), unless the authorization is terminated  or revoked sooner.       Influenza A by PCR NEGATIVE NEGATIVE Final   Influenza B by PCR NEGATIVE  NEGATIVE Final    Comment: (NOTE) The Xpert Xpress SARS-CoV-2/FLU/RSV plus assay is intended as an aid in the diagnosis of influenza from Nasopharyngeal swab specimens and should not be used as a sole basis for treatment. Nasal washings and aspirates are unacceptable for Xpert Xpress SARS-CoV-2/FLU/RSV testing.  Fact Sheet for Patients: EntrepreneurPulse.com.au  Fact Sheet for Healthcare Providers: IncredibleEmployment.be  This test is not yet approved or cleared by the [place] FDA and has been authorized for detection and/or diagnosis of SARS-CoV-2 by FDA under an Emergency Use Authorization (EUA). This EUA will remain in effect (meaning this test can be used) for the duration of the COVID-19 declaration under Section 564(b)(1) of the Act, 21 U.S.C. section 360bbb-3(b)(1), unless the authorization is terminated or revoked.  Performed at [facility], [address], [place], [place]  [address]   Blood Culture (routine x 2)     Status: None   Collection Time: 12/15/21 10:15 PM   Specimen: BLOOD RIGHT ARM  Result Value Ref Range Status   Specimen Description BLOOD RIGHT ARM  Final   Special Requests   Final    BOTTLES DRAWN AEROBIC AND ANAEROBIC Blood Culture results may not be optimal due to an inadequate volume of blood received in culture bottles   Culture   Final    NO GROWTH 5 DAYS Performed at [facility], [address], [place], [place] [address]    Report Status 12/20/2021 FINAL  Final  Blood Culture (routine x 2)     Status: None   Collection Time: 12/15/21 10:15 PM   Specimen: Right Antecubital; Blood  Result Value Ref Range Status   Specimen Description RIGHT ANTECUBITAL  Final   Special Requests   Final    BOTTLES DRAWN AEROBIC AND ANAEROBIC Blood Culture results may not be optimal due to an excessive volume of blood received in culture bottles   Culture   Final    NO GROWTH 5 DAYS Performed at [facility], [address], [place], [place] [address]    Report Status 12/20/2021 FINAL  Final  Urine Culture     Status: Abnormal   Collection Time: 12/16/21 12:59 PM   Specimen: Urine, Catheterized  Result Value Ref Range Status   Specimen Description   Final    URINE, CATHETERIZED Performed at [facility], [address] [place], [place] [address]    Special Requests   Final    NONE Performed at [facility], [address], [place], [place] [address]    Culture MULTIPLE SPECIES PRESENT, SUGGEST RECOLLECTION (A)  Final   Report Status 12/17/2021 FINAL  Final  MRSA Next Gen by PCR, Nasal     Status: Abnormal   Collection Time: 12/18/21  8:00 AM   Specimen: Nasal Mucosa; Nasal Swab  Result Value Ref Range Status   MRSA by PCR Next Gen DETECTED (A) NOT DETECTED Final    Comment: RESULT CALLED TO, READ BACK BY AND VERIFIED WITH: SMITH @ 1026 ON [id number] BY HENDERSON L (NOTE) The GeneXpert MRSA Assay (FDA approved for NASAL specimens only), is one  component of a comprehensive MRSA colonization surveillance program. It is not intended to diagnose MRSA infection nor to guide or monitor treatment for MRSA infections. Test performance is not FDA approved in patients less than 25 years old. Performed at [facility], [address], [place], [place] [address]      Radiology Studies: No results found.   Scheduled Meds:  Chlorhexidine Gluconate Cloth  6 each Topical  Daily   lactulose  20 g Oral Daily   levothyroxine  100 mcg Oral Q0600   mouth rinse  15 mL Mouth Rinse BID   midodrine  10 mg Oral TID WC   mupirocin ointment   Nasal BID   pantoprazole (PROTONIX) IV  40 mg Intravenous Q12H   sodium chloride flush  10-40 mL Intracatheter Q12H   Continuous Infusions:  vancomycin 1,250 mg (12/20/21 2305)     LOS: 5 days   Time: More than 35 minutes has been dedicated to face to face examination, chart review, analysis of blood-work done, discussion with GI specialist, note creation and coordination care.   Barton Dubois, MD [facility]   To contact the attending provider between 7A-7P or the covering provider during after hours 7P-7A, please log into the web site [email or web address] and access using universal [place] password for that web site. If you do not have the password, please call the hospital operator.  12/21/2021, 11:40 AM

## 2021-12-21 NOTE — Progress Notes (Signed)
° ° °  Subjective: Was impacted when presented to hospital. Notes abdominal pain around umbilicus although this has improved. No melena or hematochezia. Abdominal pain improved from admission. No prior para. Not interested in EGD. Mild nausea this morning.   Objective: Vital signs in last 24 hours: Temp:  [97.4 F (36.3 C)-98.2 F (36.8 C)] 98 F (36.7 C) (12/27 0500) Pulse Rate:  [72-91] 82 (12/27 0500) Resp:  [17-22] 18 (12/27 0500) BP: (100-121)/(42-70) 107/58 (12/27 0500) SpO2:  [93 %-98 %] 93 % (12/27 0500) Weight:  [114.7 kg] 114.7 kg (12/27 0700) Last BM Date: 12/20/21 General:   Alert and oriented, pleasant, chronically ill-appearing Head:  Normocephalic and atraumatic. Abdomen:  Bowel sounds present, obese and distended with anasarca at flanks, mild TTP periumbilically but without rebound or guarding Extremities:  With marked pedal edema, pitting edema extending up thighs with anasarca. lymphedema Neurologic:  Alert and  oriented x4;  negative asterixis  Psych:  Alert and cooperative. Normal mood and affect.  Intake/Output from previous day: 12/26 0701 - 12/27 0700 In: 152.4 [P.O.:120; I.V.:32.4] Out: 301 [Urine:300; Stool:1] Intake/Output this shift: No intake/output data recorded.  Lab Results: Recent Labs    12/19/21 0524 12/20/21 0424  WBC 7.8 8.0  HGB 9.8* 10.4*  HCT 29.2* 31.8*  PLT 101* 116*   BMET Recent Labs    12/19/21 0524  NA 136  K 3.3*  CL 103  CO2 22  GLUCOSE 138*  BUN 14  CREATININE 0.65  CALCIUM 7.9*   LFT Recent Labs    12/19/21 0524  PROT 4.5*  ALBUMIN 2.3*  AST 28  ALT 13  ALKPHOS 26  BILITOT 1.6*     Assessment: 55 year old female with history of advanced cirrhosis felt likely secondary to Hep C (previously eradicated), presenting with acute decompensation in setting of sepsis due to lower extremity cellulitis, anasarca/ascites, encephalopathy, with GI consulted due to concern for bloody emesis.   Cirrhosis: CT pelvis  with contrast this admission noting large volume ascites present within pelvis. CT abd/pelvis  with contrast Nov 2022 in [place]. Changes of portal hypertension on imaging with paraesophageal varices, splenomegaly, and moderate ascites at that time. [place] para had been ordered 12/23, but felt not sufficient ascites to drain and was hypotensive. Patient does note discomfort and doubt we are dealing with SBP but would be helpful to rule this out. Notable anasarca likely largest contributor to symptoms. Would recommend [place] para with fluid analysis.   Anasarca/ascites: recommend daily weights. doppler ultrasound to ensure portal vein patency. No recent US on file. As outpatient was on torsemide 20 mg daily. Hypotension improved and off pressor support. Could consider rounds of IV albumin followed by Lasix.   Concern for hematemesis: patient declining EGD. Hgb stable. No overt GI bleeding.   Encephalopathy: resolved. Continue lactulose dosing to achieve 3 soft BMs daily.    Plan: Will check labs to calculate MELD Doppler US [place] para with fluid analysis PPI BID Lactulose titrated to achieve 3 soft BMs daily Will discuss with GI attending role for IV albumin and Lasix   Annitta Needs, PhD, ANP-BC [facility]     LOS: 5 days    12/21/2021, 8:25 AM

## 2021-12-21 NOTE — Plan of Care (Signed)

## 2021-12-21 NOTE — Progress Notes (Signed)
PT tolerated right sided paracentesis procedure well today and 4 Liters of clear yellow fluid removed with labs collected and sent for processing at 1253. PT verbalized understanding of post procedure instructions and transferred back to inpatient bed assignment at this time via stretcher with NAD noted.

## 2021-12-22 DIAGNOSIS — R601 Generalized edema: Secondary | ICD-10-CM

## 2021-12-22 DIAGNOSIS — A419 Sepsis, unspecified organism: Secondary | ICD-10-CM

## 2021-12-22 DIAGNOSIS — K7460 Unspecified cirrhosis of liver: Secondary | ICD-10-CM

## 2021-12-22 DIAGNOSIS — R188 Other ascites: Secondary | ICD-10-CM | POA: Diagnosis not present

## 2021-12-22 DIAGNOSIS — K920 Hematemesis: Secondary | ICD-10-CM | POA: Diagnosis not present

## 2021-12-22 DIAGNOSIS — Z515 Encounter for palliative care: Secondary | ICD-10-CM | POA: Diagnosis not present

## 2021-12-22 DIAGNOSIS — L03119 Cellulitis of unspecified part of limb: Secondary | ICD-10-CM | POA: Diagnosis not present

## 2021-12-22 DIAGNOSIS — Z7189 Other specified counseling: Secondary | ICD-10-CM | POA: Diagnosis not present

## 2021-12-22 LAB — COMPREHENSIVE METABOLIC PANEL
ALT: 11 U/L (ref 0–44)
AST: 28 U/L (ref 15–41)
Albumin: 3 g/dL — ABNORMAL LOW (ref 3.5–5.0)
Alkaline Phosphatase: 61 U/L (ref 38–126)
Anion gap: 6 (ref 5–15)
BUN: 11 mg/dL (ref 6–20)
CO2: 25 mmol/L (ref 22–32)
Calcium: 8.2 mg/dL — ABNORMAL LOW (ref 8.9–10.3)
Chloride: 106 mmol/L (ref 98–111)
Creatinine, Ser: 0.56 mg/dL (ref 0.44–1.00)
GFR, Estimated: >60 mL/min (ref >60)
Glucose, Bld: 75 mg/dL (ref 70–99)
Potassium: 3.4 mmol/L — ABNORMAL LOW (ref 3.5–5.1)
Sodium: 137 mmol/L (ref 135–145)
Total Bilirubin: 1.9 mg/dL — ABNORMAL HIGH (ref 0.3–1.2)
Total Protein: 4.6 g/dL — ABNORMAL LOW (ref 6.5–8.1)

## 2021-12-22 MED ORDER — LACTULOSE 10 GM/15ML PO SOLN
10.0000 g | Freq: Every day | ORAL | Status: DC
  Filled 2021-12-22 (×2): qty 30

## 2021-12-22 MED ORDER — ALBUMIN HUMAN 25 % IV SOLN
INTRAVENOUS | Status: AC
  Filled 2021-12-22: qty 200

## 2021-12-22 MED ORDER — TORSEMIDE 20 MG PO TABS
20.0000 mg | ORAL_TABLET | Freq: Every day | ORAL | Status: DC
  Administered 2021-12-22 – 2021-12-26 (×4): 20 mg via ORAL
  Filled 2021-12-22 (×4): qty 1

## 2021-12-22 MED ORDER — POTASSIUM CHLORIDE CRYS ER 20 MEQ PO TBCR
20.0000 meq | EXTENDED_RELEASE_TABLET | Freq: Once | ORAL | Status: AC
  Administered 2021-12-22: 15:00:00 20 meq via ORAL
  Filled 2021-12-22: qty 1

## 2021-12-22 NOTE — Progress Notes (Signed)
Pharmacy Antibiotic Note  Kristie Brooks is a 55 y.o. female admitted on 12/15/2021 with cellulitis.  Pharmacy has been consulted for vancomycin dosing. Scr stable, patient with lower extremity edma and dermatitis.  Plan: Continue Vancomycin 1250mg  IV Q24H. (Vancomycin goal trough 10-15 ) F/U deescalation to po tx  Height: 5\' 5"  (165.1 cm) Weight: 111.6 kg (246 lb 0.5 oz) IBW/kg (Calculated) : 57  Temp (24hrs), Avg:97.9 F (36.6 C), Min:97.7 F (36.5 C), Max:98 F (36.7 C)  Recent Labs  Lab 12/15/21 2215 12/16/21 0001 12/16/21 2002 12/17/21 0443 12/17/21 0623 12/18/21 0505 12/19/21 0524 12/19/21 1116 12/20/21 0424 12/21/21 1021 12/22/21 0524  WBC 15.9*  --   --   --  9.8  --  7.8  --  8.0 9.1  --   CREATININE 1.03*  --    < > 1.03*  --  0.86 0.65  --   --  0.61 0.56  LATICACIDVEN 2.3* 2.0*  --  1.3  --   --   --   --   --   --   --   VANCOTROUGH  --   --   --   --   --   --   --  21*  --   --   --    < > = values in this interval not displayed.     Estimated Creatinine Clearance: 97.7 mL/min (by C-G formula based on SCr of 0.56 mg/dL).    Allergies  Allergen Reactions   Dakin's [Sodium Hypochlorite] Dermatitis   Zosyn [Piperacillin Sod-Tazobactam So] Other (See Comments)    Blood count dropped   Vancomycin 12/22>>  12/24 MRSA PCR positive  12/21 BCX: ngtd 12/21 UCX: multiple species 12/27 Peritoneal fluid: no growth to date  Thank you for allowing pharmacy to be a part of this patients care.  Isac Sarna, BS Pharm D, [place] Clinical Pharmacist Pager [phone] 12/22/2021 9:04 AM

## 2021-12-22 NOTE — TOC Progression Note (Signed)
Transition of Care [facility]) - Progression Note    Patient Details  Name: Kristie Brooks MRN: [id number] Date of Birth: Nov 07, 1966  [facility] CM/SW Contact  Boneta Lucks, RN Phone Number: 12/22/2021, 11:18 AM  Clinical Narrative:   Patient's husband called TOC wanting referral sent to [facility].  TOC checked with [facility] they do not have beds and does not accept [employer]. Husband updated.    Barriers to Discharge: Continued Medical Work up  Expected Discharge Plan and [facility]  Readmission Risk Interventions Readmission Risk Prevention Plan 12/17/2021  Medication Screening Complete  Transportation Screening Complete  Some recent data might be hidden

## 2021-12-22 NOTE — Progress Notes (Addendum)
Subjective:  States she did not take lactulose today because stool is just pouring. States she has had greater than 10 moderate volume stools in the last 24 hours. Last dose of lactulose 24 hours ago.   Objective: Vital signs in last 24 hours: Temp:  [97.7 F (36.5 C)-98 F (36.7 C)] 97.7 F (36.5 C) (12/28 0506) Pulse Rate:  [76-85] 76 (12/28 0506) Resp:  [17-20] 17 (12/28 0506) BP: (96-122)/(51-68) 96/51 (12/28 0506) SpO2:  [93 %-98 %] 95 % (12/28 0506) Weight:  [111.6 kg] 111.6 kg (12/28 0506)  General:   Alert,  obese. pleasant and cooperative in NAD Head:  Normocephalic and atraumatic. Eyes:  Sclera clear, no icterus.  Abdomen:  Soft, nontender and nondistended.  Normal bowel sounds, without guarding, and without rebound.   Extremities:  Without clubbing. Legs wrapped to knees. Thighs with mild pitting edema.  Neurologic:  Alert and  oriented x4;  grossly normal neurologically. Psych:  Alert and cooperative. Normal mood and affect.  Intake/Output from previous day: 12/27 0701 - 12/28 0700 In: 1555.5 [P.O.:1100; IV Piggyback:455.5] Out: 453 [Urine:450; Stool:3] Intake/Output this shift: No intake/output data recorded.  Lab Results: CBC Recent Labs    12/20/21 0424 12/21/21 1021  WBC 8.0 9.1  HGB 10.4* 10.9*  HCT 31.8* 33.1*  MCV 90.9 92.7  PLT 116* 124*   BMET Recent Labs    12/21/21 1021 12/22/21 0524  NA 135 137  K 3.4* 3.4*  CL 103 106  CO2 24 25  GLUCOSE 116* 75  BUN 14 11  CREATININE 0.61 0.56  CALCIUM 8.0* 8.2*   LFTs Recent Labs    12/21/21 1021 12/22/21 0524  BILITOT 1.9* 1.9*  ALKPHOS 101 61  AST 38 28  ALT 15 11  PROT 4.8* 4.6*  ALBUMIN 2.3* 3.0*   No results for input(s): LIPASE in the last 72 hours. PT/INR Recent Labs    12/21/21 1021  LABPROT 16.2*  INR 1.3*      Imaging Studies: CT PELVIS W CONTRAST  Result Date: 12/16/2021 CLINICAL DATA:  Soft tissue infection EXAM: CT PELVIS WITH CONTRAST TECHNIQUE: Multidetector CT  imaging of the pelvis was performed using the standard protocol following the bolus administration of intravenous contrast. CONTRAST:  158mL OMNIPAQUE IOHEXOL 300 MG/ML  SOLN COMPARISON:  None. FINDINGS: Urinary Tract:  No abnormality visualized. Bowel: Unremarkable visualized pelvic bowel loops. There is large volume ascites present within the pelvis. Vascular/Lymphatic: A massive varix is seen extending from the umbilicus to the left common iliac vein, likely representing a portosystemic collateral in the setting of portal venous hypertension. No pathologic adenopathy within the abdomen and pelvis. Reproductive: Involuted fibroids noted within the uterus. The pelvic organs are otherwise unremarkable. Other: There is extensive diffuse subcutaneous body wall noted within the visualized abdominal wall and flanks bilaterally. Musculoskeletal: No acute bone abnormality. No lytic or blastic bone lesion. IMPRESSION: Marked diffuse subcutaneous body wall edema. Large volume ascites. Massive varix extending from the umbilicus the left common iliac vein. Together, the findings are most in keeping with changes of portal venous hypertension. Electronically Signed   By: Fidela Salisbury M.D.   On: 12/16/2021 02:16   US Paracentesis  Result Date: 12/21/2021 INDICATION: Ascites.  Hepatitis-C.  Cirrhosis. EXAM: ULTRASOUND GUIDED  PARACENTESIS MEDICATIONS: None. COMPLICATIONS: None immediate. PROCEDURE: Informed written consent was obtained from the patient after a discussion of the risks (including hemorrhage and infection, among others), benefits and alternatives to treatment. A timeout was performed prior to the initiation of  the procedure. Initial ultrasound scanning demonstrates a large amount of ascites within the right lower abdominal quadrant. The right lower abdomen was prepped and draped in the usual sterile fashion. 1% lidocaine was used for local anesthesia. An ultrasound image was saved for documentation purposes.  Following this, a Yueh catheter was introduced. The paracentesis was performed. The catheter was removed and a dressing was applied. The patient tolerated the procedure well without immediate post procedural complication. FINDINGS: A total of approximately 4 L of straw-colored fluid was removed. Samples were sent to the laboratory as requested by the clinical team. IMPRESSION: Successful ultrasound-guided paracentesis yielding 4 liters of peritoneal fluid. Electronically Signed   By: Van Clines M.D.   On: 12/21/2021 14:09   DG Chest Port 1 View  Result Date: 12/15/2021 CLINICAL DATA:  Fever and lower extremity swelling. EXAM: PORTABLE CHEST 1 VIEW COMPARISON:  November 10, 2021 FINDINGS: Low lung volumes are seen. Mild to moderate severity areas of atelectasis and/or infiltrate are seen within the bilateral lung bases. There is a small left pleural effusion. No pneumothorax is identified. The heart size and mediastinal contours are within normal limits. Degenerative changes are noted throughout the thoracic spine. IMPRESSION: 1. Low lung volumes with mild to moderate severity bibasilar atelectasis and/or infiltrate. 2. Small left pleural effusion. Electronically Signed   By: Virgina Norfolk M.D.   On: 12/15/2021 22:47   ECHOCARDIOGRAM COMPLETE  Result Date: 12/16/2021    ECHOCARDIOGRAM REPORT   Patient Name:   KAILEIGH VISWANATHAN Date of Exam: 12/16/2021 Medical Rec #:  [id number]           Height:       65.0 in Accession #:    [id number]          Weight:       290.0 lb Date of Birth:  May 22, 1966           BSA:          2.317 m Patient Age:    66 years            BP:           73/36 mmHg Patient Gender: F                   HR:           106 bpm. Exam Location:  [facility] Procedure: 2D Echo, Cardiac Doppler and Color Doppler Indications:    Edema  History:        Patient has no prior history of Echocardiogram examinations.                 Signs/Symptoms:Edema.  Sonographer:    Wenda Low  Referring Phys: [id number] ASIA [place]  Sonographer Comments: Patient is morbidly obese and no subcostal window. IMPRESSIONS  1. Left ventricular ejection fraction, by estimation, is 65 to 70%. The left ventricle has normal function. The left ventricle has no regional wall motion abnormalities. The left ventricular internal cavity size was mildly dilated. There is mild left ventricular hypertrophy. Left ventricular diastolic parameters were normal.  2. Right ventricular systolic function is normal. The right ventricular size is normal. There is normal pulmonary artery systolic pressure.  3. The mitral valve is normal in structure. Trivial mitral valve regurgitation.  4. The aortic valve is tricuspid. Aortic valve regurgitation is not visualized. Aortic valve sclerosis is present, with no evidence of aortic valve stenosis. FINDINGS  Left Ventricle: Left ventricular ejection fraction, by estimation, is  65 to 70%. The left ventricle has normal function. The left ventricle has no regional wall motion abnormalities. The left ventricular internal cavity size was mildly dilated. There is  mild left ventricular hypertrophy. Left ventricular diastolic parameters were normal. Right Ventricle: The right ventricular size is normal. Right vetricular wall thickness was not assessed. Right ventricular systolic function is normal. There is normal pulmonary artery systolic pressure. The tricuspid regurgitant velocity is 2.48 m/s, and with an assumed right atrial pressure of 8 mmHg, the estimated right ventricular systolic pressure is 78.2 mmHg. Left Atrium: Left atrial size was normal in size. Right Atrium: Right atrial size was normal in size. Pericardium: There is no evidence of pericardial effusion. Mitral Valve: The mitral valve is normal in structure. Trivial mitral valve regurgitation. MV peak gradient, 7.2 mmHg. The mean mitral valve gradient is 4.0 mmHg. Tricuspid Valve: The tricuspid valve is normal in structure.  Tricuspid valve regurgitation is trivial. Aortic Valve: The aortic valve is tricuspid. Aortic valve regurgitation is not visualized. Aortic valve sclerosis is present, with no evidence of aortic valve stenosis. Aortic valve mean gradient measures 8.0 mmHg. Aortic valve peak gradient measures 16.6 mmHg. Aortic valve area, by VTI measures 2.25 cm. Pulmonic Valve: The pulmonic valve was normal in structure. Pulmonic valve regurgitation is not visualized. Aorta: The aortic root is normal in size and structure. IAS/Shunts: No atrial level shunt detected by color flow Doppler.  LEFT VENTRICLE PLAX 2D LVIDd:         5.20 cm     Diastology LVIDs:         3.20 cm     LV e' medial:    13.40 cm/s LV PW:         1.10 cm     LV E/e' medial:  8.0 LV IVS:        1.00 cm     LV e' lateral:   16.80 cm/s LVOT diam:     2.00 cm     LV E/e' lateral: 6.4 LV SV:         100 LV SV Index:   43 LVOT Area:     3.14 cm  LV Volumes (MOD) LV vol d, MOD A2C: 75.0 ml LV vol d, MOD A4C: 69.1 ml LV vol s, MOD A2C: 32.3 ml LV vol s, MOD A4C: 27.0 ml LV SV MOD A2C:     42.7 ml LV SV MOD A4C:     69.1 ml LV SV MOD BP:      42.2 ml RIGHT VENTRICLE RV Basal diam:  3.30 cm RV Mid diam:    4.00 cm RV S prime:     19.20 cm/s TAPSE (M-mode): 3.1 cm LEFT ATRIUM             Index        RIGHT ATRIUM           Index LA diam:        4.50 cm 1.94 cm/m   RA Area:     17.50 cm LA Vol (A2C):   48.1 ml 20.76 ml/m  RA Volume:   41.20 ml  17.79 ml/m LA Vol (A4C):   92.3 ml 39.84 ml/m LA Biplane Vol: 71.3 ml 30.78 ml/m  AORTIC VALVE                     PULMONIC VALVE AV Area (Vmax):    2.39 cm      PV Vmax:  1.21 m/s AV Area (Vmean):   2.65 cm      PV Peak grad:  5.9 mmHg AV Area (VTI):     2.25 cm AV Vmax:           204.00 cm/s AV Vmean:          128.000 cm/s AV VTI:            0.444 m AV Peak Grad:      16.6 mmHg AV Mean Grad:      8.0 mmHg LVOT Vmax:         155.00 cm/s LVOT Vmean:        108.000 cm/s LVOT VTI:          0.318 m LVOT/AV VTI ratio:  0.72  AORTA Ao Root diam: 2.80 cm MITRAL VALVE                TRICUSPID VALVE MV Area (PHT): 3.13 cm     TR Peak grad:   24.6 mmHg MV Area VTI:   3.86 cm     TR Vmax:        248.00 cm/s MV Peak grad:  7.2 mmHg MV Mean grad:  4.0 mmHg     SHUNTS MV Vmax:       1.34 m/s     Systemic VTI:  0.32 m MV Vmean:      88.2 cm/s    Systemic Diam: 2.00 cm MV Decel Time: 242 msec MV E velocity: 107.00 cm/s MV A velocity: 89.40 cm/s MV E/A ratio:  1.20 Dorris Carnes MD Electronically signed by Dorris Carnes MD Signature Date/Time: 12/16/2021/3:38:01 PM    Final    US LIVER DOPPLER  Result Date: 12/21/2021 CLINICAL DATA:  55 year old female with cirrhosis EXAM: DUPLEX ULTRASOUND OF LIVER TECHNIQUE: Color and duplex Doppler ultrasound was performed to evaluate the hepatic in-flow and out-flow vessels. COMPARISON:  None. FINDINGS: Portal Vein Velocities Main:  58 cm/sec Right:  57 cm/sec Left: Not visualized Hepatic Vein Velocities Right:  34 cm/sec Middle: Not visualized Left:  60 cm/sec Hepatic Artery Velocity:  78 cm/sec Splenic Vein Velocity:  59 cm/sec Varices: Present Ascites: Present Recanalized umbilical vein. Cirrhotic morphology of the liver IMPRESSION: Cirrhotic morphology of the liver with stigmata of portal hypertension including recanalized umbilical vein, additional varices, and ascites Signed, Dulcy Fanny. Dellia Nims, RPVI [facility] Electronically Signed   By: Corrie Mckusick D.O.   On: 12/21/2021 12:23   CT EXTREM LOWER W CM BIL  Result Date: 12/16/2021 CLINICAL DATA:  Soft tissue infection of the lower extremities bilaterally EXAM: CT OF THE LOWER BILATERAL EXTREMITY WITH CONTRAST TECHNIQUE: Multidetector CT imaging of the lower bilateral extremity was performed with axial images obtained from the acetabular roofs through the proximal tibial metadiaphysis following intravenous contrast administration. COMPARISON:  None. CONTRAST:  140mL OMNIPAQUE IOHEXOL  300 MG/ML  SOLN FINDINGS: Bones/Joint/Cartilage Normal alignment. No acute fracture or dislocation. No osseous erosion or abnormal periosteal reaction. 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 Ligaments Suboptimally assessed by CT. Muscles and Tendons Unremarkable Soft tissues There is marked diffuse subcutaneous edema involving the lower extremities bilaterally circumferentially. Edematous changes extend into the inter fascial planes of the anterior, posterior, and medial muscular compartments of the thighs bilaterally. No discrete drainable fluid collection identified. Small bilateral knee effusions are present. Extensive subcutaneous edema is noted within the visualized pannus bilaterally. Large volume ascites is present within the visualized pelvis. IMPRESSION:  Extensive subcutaneous edema involving the lower extremities bilaterally and pannus. Infiltrative changes extend into the muscular compartments of the thighs bilaterally. No discrete drainable fluid collection is identified, however. Small bilateral knee effusions. Large volume ascites. Electronically Signed   By: Fidela Salisbury M.D.   On: 12/16/2021 02:12   [place] EKG SITE RITE  Result Date: 12/16/2021 If Site Rite image not attached, placement could not be confirmed due to current cardiac rhythm. [2 weeks]   Assessment:  55 y/o female with advanced cirrhosis felt to be secondary to prior Hepatitis C (previously eradicated), presenting with acute decompensation in setting of sepsis due to lower extremity cellulitis, anasarca/ascites, encephalopathy, with GI consulted due to bloody emesis.   Cirrhosis/anasarca/ascites: CT imaging with evidence of large volume ascites within the pelvis. 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 Portal vein patent on doppler. Hypotension has prevented use of diuretics thus  far. Had been on torsemide 20mg  daily as outpatient. Albumin runs started yesterday. Weight down 18 pounds since 12/17/21, down 6 pounds in last 24 hours since 4 liters of ascitic fluid removed yesterday. Ascitic fluid, cell count with no signs of SBP (she has been on vancomycin for cellulitis). Cytology pending. MELD Na yesterday, 14.    Hematemesis: Hgb has been stable. No further overt GI bleeding. No further vomiting. No melena, brbpr. Tolerating diet. Patient has declined EGD (she has known paraesophageal varices seen on CT 10/2021 at [facility]). No prior EGD.   Encephalopathy: resolved. Currently lactulose not taken today due to too many loose stools. Patient is concerned she is having spurious diarrhea and requesting a laxative to clear her out. To check for impaction. I doubt we are dealing with overflow issues.   Plan: Labs to calculate MELD tomorrow.  Follow up pending ascitic fluid results.  PPI BID. F/U AFP. Ok to advance to 2 gram sodium diet as tolerated. Would advise EGD (when agreeable). Would benefit from non-selective beta blocker once hypotension resolves.  Check for impaction. If negative, then titrate lactulose down with goal of 3 soft stools daily.   Laureen Ochs. Bernarda Caffey [facility] [phone] 12/28/202211:12 AM    LOS: 6 days    Addendum: per nursing, no fecal impaction.   Laureen Ochs. Bernarda Caffey [facility] [phone] 12/28/202211:22 AM

## 2021-12-22 NOTE — Progress Notes (Signed)
Palliative: [name], Kristie Brooks, is lying quietly in bed.  She greets me, making and mostly keeping eye contact.  She appears somewhat chronically ill and frail.  She is alert and oriented, able to make her needs known.  There is no family at bedside at this time.   We talked about her paracentesis.  Kristie Brooks is knowledgeable about her health, and is able to tell me that 4 L of fluid was removed.  She tells me that she is able to breathe better.  We talked about sending fluid for testing and SBP.  We talked about the possibility of recurrent taps.  She states understanding.  Kristie Brooks shares that her husband is trying to get her admitted to a different short-term rehab facility.  I share the transition of care team is working with her husband.  I share that it is most likely that she would return to [facility] as they shared that they could accept her back.  She states understanding.  Conference with attending, bedside nursing staff, transition of care team related to patient condition, needs, goals of care, disposition.  Plan: At this point continue full scope/full code.  Kristie Brooks has stated she would accept trach/PEG.  Time for outcomes.  Anticipate returning to short-term rehab at [facility].  Would benefit from outpatient palliative services  25 minutes Quinn Axe, NP Palliative medicine team Team phone [phone] Greater than 50% of this time was spent counseling and coordinating care related to the above assessment and plan.

## 2021-12-22 NOTE — Progress Notes (Addendum)
PROGRESS NOTE  Kristie Brooks TFT:[id number] DOB: [birth year] DOA: 12/15/2021 PCP: Keane Police, MD  Brief History:  As per H&P written by Dr. Clearence Ped on 12/16/21  Kristie Brooks  is a 55 y.o. female, with history of cirrhosis, presents ED for chief complaint of what she says is fluid overload.  What was reported by EMS states that she was sent in for fever and confusion.  Does report that her husband said she was confused that she has been thinks that she was.  She reports that she had fluid overload in her legs and thighs for 3 or 4 days.  She reports they have been weeping for 3 or 4 days.  She is also had erythema for 3 or 4 days.  She reports that all the symptoms started the same time.  She had fevers but she is unsure how high.  She denies any confusion.  She denies any cough, dysuria, chest pain, and vomiting.  She does report nausea intermittently since September.  She denies any diarrhea.  Patient reports no change in her normal abdominal pain.  She has had abdominal pain for a long time per her report.  Patient reports no purulent drainage from her bilateral legs.  Bilateral legs are wrapped by the ED at the time of my exam.  She does not have a history of an echo on file with our system.  Patient has no further complaints at this time.   Patient does not smoke, does not drink, does not use illicit drugs.  Patient is vaccinated for COVID.  Patient is full code.  Patient used to be a Marine scientist.   In the ED Temp 99.2, heart rate 100-118, respiratory 17-27, blood pressure at admission 84/34 but as low as systolic in the 70W Troponin 6, 7 Elevated white blood cell count 15.9, hemoglobin 11.3, platelets 126 Chemistry panel is unremarkable Albumin 1.9 Negative respiratory panel Blood culture pending CT pelvis shows marked diffuse subcu body wall edema with large volume ascites.  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 Together these findings  are most in keeping with changes of portal venous hypertension Chest x-ray shows low lung volumes with mild to moderate severity bibasilar atelectasis and/or infiltrate with small left pleural effusion Patient was given cefepime, vancomycin however 3 L bolus Cintron was discussed with patient and patient declined.  She reports she still is 1 to be full code, but does not want a central line at this time.  Options for albumin discussed with patient.  Patient accepts albumin. Admission requested for possible sepsis secondary cellulitis    Assessment/Plan:  severe sepsis secondary to cellulitis -Bilateral lower extremity cellulitis appreciated -Patient met criteria at time of admission with tachycardia, elevated respiratory rate, elevated white blood cell count, low blood pressure, lactic acid 2.3 and also on presentation confusion as part of organ dysfunction. -Currently afebrile, mentation has significantly improved and back to baseline. -Continue current antibiotics  -blood cultures neg -Ongoing lower extremity discomfort still present; overall erythematous changes and edema continue to improve slowly. -Continue to follow recommendations by wound care service.   acute metabolic encephalopathy -In the setting of sepsis -Treatment as mentioned above -Ammonia level within normal limit -Minimize the use of medication that can alter sedation. -Continue providing constant reorientation -Patient mentation back to baseline.   Liver cirrhosis with ascites/hypoalbuminemia -Continue low-sodium diet, daily weights and strict I's and O's.   -restart torsemide -Patient  has received albumin infusion with some improvement in blood pressure. -GI recommending paracentesis for fluid analysis (if not too much fluid to be taken). -12/27 paracentesis-->4L removed, only 62 WBC (doubt SBP)   Venous stasis dermatitis lower extremity edema -Continue dressing care and wound care recommendations. -Continue  current IV antibiotics. -Pharmacy to assist with vancomycin level and dosing.   hypothyroidism -TSH elevated -Synthroid dose has been started; patient initially refusing medication unrelated and acceptance to take. -Plan is to repeat thyroid panel in 8-12 weeks with further adjustment to her Synthroid regimen at that time.   6-class III obesity -Portion control and low calorie diet recommended. -Body mass index is 40.94 kg/m.   7-hypotension -Low blood pressure running at baseline in the setting of cirrhosis: Acute infection contributing and playing a role in her presentation. -Blood pressure overall improved and off pressors. -Continue adjusted dose of midodrine 3 times a day. -Planning diuretics therapy along with recommendation by GI service.   8-constipation -Continue the use of lactulose. -Reports moving bowels; no hematochezia or melena.   9-chronic respiratory failure with hypoxia -Patient reports using 3-4 L nasal cannula supplementation at least for the last couple of months prior to admission. -Currently 4 L in place with good O2 sat -Continue the use of IS and flutter valve.   10-dark hematemesis -patient reported never had EGD in the past; after discussing with GI here, she is declining EGD eval. -concerning for gastritis and ? Varices given hx of cirrhosis -Hgb has remained stable -not further episode of vomiting; reports mild nausea in the morning. -continue PPI -Follow-up with GIs recommendation.          Family Communication:   no Family at bedside  Consultants:  GI  Code Status:  FULL  DVT Prophylaxis:  [place] Heparin   Procedures: As Listed in Progress Note Above  Antibiotics: Vanc 12/22>>       Subjective: Pt states she is breathing better.  Abd pain is better after paracentesis.  Patient denies fevers, chills, headache, chest pain, dyspnea, nausea, vomiting, dysuria, hematuria, hematochezia, and melena.   Objective: Vitals:    12/21/21 1235 12/21/21 1245 12/21/21 2114 12/22/21 0506  BP: 110/63 (!) 105/57 (!) 102/58 (!) 96/51  Pulse: 81 78 79 76  Resp: 18 18 20 17   Temp:   97.9 F (36.6 C) 97.7 F (36.5 C)  TempSrc:   Oral   SpO2: 97% 98% 93% 95%  Weight:    111.6 kg  Height:        Intake/Output Summary (Last 24 hours) at 12/22/2021 1431 Last data filed at 12/22/2021 1300 Gross per 24 hour  Intake 2035.47 ml  Output 453 ml  Net 1582.47 ml   Weight change: -3.1 kg Exam:  General:  Pt is alert, follows commands appropriately, not in acute distress HEENT: No icterus, No thrush, No neck mass, [place]/AT Cardiovascular: RRR, S1/S2, no rubs, no gallops Respiratory: bibasilar crackles. No wheeze Abdomen: Soft/+BS, mild periumbilical tender, non distended, no guarding Extremities: 1 + LE edema, No lymphangitis, No petechiae, No rashes, no synovitis   Data Reviewed: I have personally reviewed following labs and imaging studies Basic Metabolic Panel: Recent Labs  Lab 12/17/21 0443 12/18/21 0505 12/19/21 0524 12/21/21 1021 12/22/21 0524  NA 135 136 136 135 137  K 3.1* 3.7 3.3* 3.4* 3.4*  CL 102 104 103 103 106  CO2 24 24 22 24 25   GLUCOSE 93 107* 138* 116* 75  BUN 17 16 14 14 11   CREATININE  1.03* 0.86 0.65 0.61 0.56  CALCIUM 8.0* 8.0* 7.9* 8.0* 8.2*  MG 1.9 2.0  --   --   --    Liver Function Tests: Recent Labs  Lab 12/16/21 2002 12/17/21 0443 12/19/21 0524 12/21/21 1021 12/22/21 0524  AST 27 26 28  38 28  ALT 12 11 13 15 11   ALKPHOS 63 68 91 101 61  BILITOT 1.7* 2.0* 1.6* 1.9* 1.9*  PROT 4.9* 4.8* 4.5* 4.8* 4.6*  ALBUMIN 2.9* 2.7* 2.3* 2.3* 3.0*   No results for input(s): LIPASE, AMYLASE in the last 168 hours. Recent Labs  Lab 12/16/21 0603  AMMONIA 33   Coagulation Profile: Recent Labs  Lab 12/15/21 2215 12/21/21 1021  INR 1.5* 1.3*   CBC: Recent Labs  Lab 12/15/21 2215 12/17/21 0623 12/19/21 0524 12/20/21 0424 12/21/21 1021  WBC 15.9* 9.8 7.8 8.0 9.1  NEUTROABS  14.2* 7.6  --   --  6.4  HGB 11.3* 8.8* 9.8* 10.4* 10.9*  HCT 34.9* 26.0* 29.2* 31.8* 33.1*  MCV 92.3 92.5 91.8 90.9 92.7  PLT 126* 105* 101* 116* 124*   Cardiac Enzymes: No results for input(s): CKTOTAL, CKMB, CKMBINDEX, TROPONINI in the last 168 hours. BNP: Invalid input(s): POCBNP CBG: No results for input(s): GLUCAP in the last 168 hours. HbA1C: No results for input(s): HGBA1C in the last 72 hours. Urine analysis:    Component Value Date/Time   COLORURINE YELLOW 12/16/2021 [place] 12/16/2021 1259   LABSPEC 1.010 12/16/2021 1259   PHURINE 6.5 12/16/2021 1259   GLUCOSEU NEGATIVE 12/16/2021 1259   HGBUR NEGATIVE 12/16/2021 1259   BILIRUBINUR NEGATIVE 12/16/2021 1259   KETONESUR NEGATIVE 12/16/2021 1259   PROTEINUR 30 (A) 12/16/2021 1259   NITRITE NEGATIVE 12/16/2021 1259   LEUKOCYTESUR NEGATIVE 12/16/2021 1259   Sepsis Labs: @LABRCNTIP (procalcitonin:4,lacticidven:4) ) Recent Results (from the past 240 hour(s))  Resp Panel by RT-PCR (Flu A&B, Covid) Nasopharyngeal Swab     Status: None   Collection Time: 12/15/21  9:33 PM   Specimen: Nasopharyngeal Swab; Nasopharyngeal(NP) swabs in vial transport medium  Result Value Ref Range Status   SARS Coronavirus 2 by RT PCR NEGATIVE NEGATIVE Final    Comment: (NOTE) SARS-CoV-2 target nucleic acids are NOT DETECTED.  The SARS-CoV-2 RNA is generally detectable in upper respiratory specimens during the acute phase of infection. The lowest concentration of SARS-CoV-2 viral copies this assay can detect is 138 copies/mL. A negative result does not preclude SARS-Cov-2 infection and should not be used as the sole basis for treatment or other patient management decisions. A negative result may occur with  improper specimen collection/handling, submission of specimen other than nasopharyngeal swab, presence of viral mutation(s) within the areas targeted by this assay, and inadequate number of viral copies(<138  copies/mL). A negative result must be combined with clinical observations, patient history, and epidemiological information. The expected result is Negative.  Fact Sheet for Patients:  EntrepreneurPulse.com.au  Fact Sheet for Healthcare Providers:  IncredibleEmployment.be  This test is no t yet approved or cleared by the [place] FDA and  has been authorized for detection and/or diagnosis of SARS-CoV-2 by FDA under an Emergency Use Authorization (EUA). This EUA will remain  in effect (meaning this test can be used) for the duration of the COVID-19 declaration under Section 564(b)(1) of the Act, 21 U.S.C.section 360bbb-3(b)(1), unless the authorization is terminated  or revoked sooner.       Influenza A by PCR NEGATIVE NEGATIVE Final   Influenza B by PCR NEGATIVE  NEGATIVE Final    Comment: (NOTE) The Xpert Xpress SARS-CoV-2/FLU/RSV plus assay is intended as an aid in the diagnosis of influenza from Nasopharyngeal swab specimens and should not be used as a sole basis for treatment. Nasal washings and aspirates are unacceptable for Xpert Xpress SARS-CoV-2/FLU/RSV testing.  Fact Sheet for Patients: EntrepreneurPulse.com.au  Fact Sheet for Healthcare Providers: IncredibleEmployment.be  This test is not yet approved or cleared by the [place] FDA and has been authorized for detection and/or diagnosis of SARS-CoV-2 by FDA under an Emergency Use Authorization (EUA). This EUA will remain in effect (meaning this test can be used) for the duration of the COVID-19 declaration under Section 564(b)(1) of the Act, 21 U.S.C. section 360bbb-3(b)(1), unless the authorization is terminated or revoked.  Performed at [facility], [address] [place], [place] [address]   Blood Culture (routine x 2)     Status: None   Collection Time: 12/15/21 10:15 PM   Specimen: BLOOD RIGHT ARM  Result Value Ref Range  Status   Specimen Description BLOOD RIGHT ARM  Final   Special Requests   Final    BOTTLES DRAWN AEROBIC AND ANAEROBIC Blood Culture results may not be optimal due to an inadequate volume of blood received in culture bottles   Culture   Final    NO GROWTH 5 DAYS Performed at [facility], [address] [place], [place] [address]    Report Status 12/20/2021 FINAL  Final  Blood Culture (routine x 2)     Status: None   Collection Time: 12/15/21 10:15 PM   Specimen: Right Antecubital; Blood  Result Value Ref Range Status   Specimen Description RIGHT ANTECUBITAL  Final   Special Requests   Final    BOTTLES DRAWN AEROBIC AND ANAEROBIC Blood Culture results may not be optimal due to an excessive volume of blood received in culture bottles   Culture   Final    NO GROWTH 5 DAYS Performed at [facility], [address] [place], [place] [address]    Report Status 12/20/2021 FINAL  Final  Urine Culture     Status: Abnormal   Collection Time: 12/16/21 12:59 PM   Specimen: Urine, Catheterized  Result Value Ref Range Status   Specimen Description   Final    URINE, CATHETERIZED Performed at [facility], [address], [place], [place] [address]    Special Requests   Final    NONE Performed at [facility], [address] [place], [place] [address]    Culture MULTIPLE SPECIES PRESENT, SUGGEST RECOLLECTION (A)  Final   Report Status 12/17/2021 FINAL  Final  MRSA Next Gen by PCR, Nasal     Status: Abnormal   Collection Time: 12/18/21  8:00 AM   Specimen: Nasal Mucosa; Nasal Swab  Result Value Ref Range Status   MRSA by PCR Next Gen DETECTED (A) NOT DETECTED Final    Comment: RESULT CALLED TO, READ BACK BY AND VERIFIED WITH: SMITH @ 1026 ON [id number] BY HENDERSON L (NOTE) The GeneXpert MRSA Assay (FDA approved for NASAL specimens only), is one component of a comprehensive MRSA colonization surveillance program. It is not intended to diagnose MRSA infection nor to guide or monitor treatment  for MRSA infections. Test performance is not FDA approved in patients less than 68 years old. Performed at [facility], [address], [place], [place] [address]   Gram stain     Status: None   Collection Time: 12/21/21 12:20 PM   Specimen: Peritoneal Washings  Result Value Ref Range Status   Specimen Description PERITONEAL  Final   Special Requests NONE  Final   Gram Stain   Final    NO ORGANISMS SEEN NO WBC SEEN CYTOSPIN SMEAR Performed at [facility], [address] [place], [place] [address]    Report Status 12/21/2021 FINAL  Final  Culture, body fluid w Gram Stain-bottle     Status: None (Preliminary result)   Collection Time: 12/21/21 12:20 PM   Specimen: Peritoneal Washings  Result Value Ref Range Status   Specimen Description PERITONEAL  Final   Special Requests   Final    BOTTLES DRAWN AEROBIC AND ANAEROBIC Blood Culture results may not be optimal due to an excessive volume of blood received in culture bottles   Culture   Final    NO GROWTH < 24 HOURS Performed at [facility], [address] [place], [place] [address]    Report Status PENDING  Incomplete     Scheduled Meds:  Chlorhexidine Gluconate Cloth  6 each Topical Daily   [START ON 12/23/2021] lactulose  10 g Oral Daily   levothyroxine  100 mcg Oral Q0600   mouth rinse  15 mL Mouth Rinse BID   midodrine  10 mg Oral TID WC   mupirocin ointment   Nasal BID   pantoprazole (PROTONIX) IV  40 mg Intravenous Q12H   sodium chloride flush  10-40 mL Intracatheter Q12H   torsemide  20 mg Oral Daily   Continuous Infusions:  albumin human 50 g (12/22/21 1023)   vancomycin 1,250 mg (12/21/21 2304)    Procedures/Studies: CT PELVIS W CONTRAST  Result Date: 12/16/2021 CLINICAL DATA:  Soft tissue infection EXAM: CT PELVIS WITH CONTRAST TECHNIQUE: Multidetector CT imaging of the pelvis was performed using the standard protocol following the bolus administration of intravenous contrast. CONTRAST:  175m OMNIPAQUE  IOHEXOL 300 MG/ML  SOLN COMPARISON:  None. FINDINGS: Urinary Tract:  No abnormality visualized. Bowel: Unremarkable visualized pelvic bowel loops. There is large volume ascites present within the pelvis. Vascular/Lymphatic: A massive varix is seen extending from the umbilicus to the left common iliac vein, likely representing a portosystemic collateral in the setting of portal venous hypertension. No pathologic adenopathy within the abdomen and pelvis. Reproductive: Involuted fibroids noted within the uterus. The pelvic organs are otherwise unremarkable. Other: There is extensive diffuse subcutaneous body wall noted within the visualized abdominal wall and flanks bilaterally. Musculoskeletal: No acute bone abnormality. No lytic or blastic bone lesion. IMPRESSION: Marked diffuse subcutaneous body wall edema. Large volume ascites. Massive varix extending from the umbilicus the left common iliac vein. Together, the findings are most in keeping with changes of portal venous hypertension. Electronically Signed   By: AFidela SalisburyM.D.   On: 12/16/2021 02:16   [place]acentesis  Result Date: 12/21/2021 INDICATION: Ascites.  Hepatitis-C.  Cirrhosis. EXAM: ULTRASOUND GUIDED  PARACENTESIS MEDICATIONS: None. COMPLICATIONS: None immediate. PROCEDURE: Informed written consent was obtained from the patient after a discussion of the risks (including hemorrhage and infection, among others), benefits and alternatives to treatment. A timeout was performed prior to the initiation of the procedure. Initial ultrasound scanning demonstrates a large amount of ascites within the right lower abdominal quadrant. The right lower abdomen was prepped and draped in the usual sterile fashion. 1% lidocaine was used for local anesthesia. An ultrasound image was saved for documentation purposes. Following this, a Yueh catheter was introduced. The paracentesis was performed. The catheter was removed and a dressing was applied. The patient  tolerated the procedure well without immediate post procedural complication. FINDINGS: A total of approximately 4 L of straw-colored fluid was removed. Samples were sent to the laboratory as requested by the clinical team. IMPRESSION: Successful ultrasound-guided paracentesis yielding 4 liters of peritoneal fluid. Electronically Signed   By: Van Clines M.D.   On: 12/21/2021 14:09   DG Chest Port 1 View  Result Date: 12/15/2021 CLINICAL DATA:  Fever and lower extremity swelling. EXAM: PORTABLE CHEST 1 VIEW COMPARISON:  November 10, 2021 FINDINGS: Low lung volumes are seen. Mild to moderate severity areas of atelectasis and/or infiltrate are seen within the bilateral lung bases. There is a small left pleural effusion. No pneumothorax is identified. The heart size and mediastinal contours are within normal limits. Degenerative changes are noted throughout the thoracic spine. IMPRESSION: 1. Low lung volumes with mild to moderate severity bibasilar atelectasis and/or infiltrate. 2. Small left pleural effusion. Electronically Signed   By: Virgina Norfolk M.D.   On: 12/15/2021 22:47   ECHOCARDIOGRAM COMPLETE  Result Date: 12/16/2021    ECHOCARDIOGRAM REPORT   Patient Name:   Kristie Brooks Date of Exam: 12/16/2021 Medical Rec #:  [id number]           Height:       65.0 in Accession #:    [id number]          Weight:       290.0 lb Date of Birth:  1966-09-07           BSA:          2.317 m Patient Age:    51 years            BP:           73/36 mmHg Patient Gender: F                   HR:           106 bpm. Exam Location:  [facility] Procedure: 2D Echo, Cardiac Doppler and Color Doppler Indications:    Edema  History:        Patient has no prior history of Echocardiogram examinations.                 Signs/Symptoms:Edema.  Sonographer:    Wenda Low Referring Phys: [id number] ASIA B Souris  Sonographer Comments: Patient is morbidly obese and no subcostal window. IMPRESSIONS  1. Left  ventricular ejection fraction, by estimation, is 65 to 70%. The left ventricle has normal function. The left ventricle has no regional wall motion abnormalities. The left ventricular internal cavity size was mildly dilated. There is mild left ventricular hypertrophy. Left ventricular diastolic parameters were normal.  2. Right ventricular systolic function is normal. The right ventricular size is normal. There is normal pulmonary artery systolic pressure.  3. The mitral valve is normal in structure. Trivial mitral valve regurgitation.  4. The aortic valve is tricuspid. Aortic valve regurgitation is not visualized. Aortic valve sclerosis is present, with no evidence of aortic valve stenosis. FINDINGS  Left Ventricle: Left ventricular ejection fraction, by estimation, is 65 to 70%. The left ventricle has normal function. The left ventricle has no regional wall motion abnormalities. The left ventricular internal cavity size was mildly dilated. There is  mild left ventricular hypertrophy. Left ventricular diastolic parameters were normal. Right Ventricle: The right ventricular size is normal. Right vetricular wall thickness was not assessed. Right ventricular systolic function is normal. There is normal pulmonary artery systolic pressure.  The tricuspid regurgitant velocity is 2.48 m/s, and with an assumed right atrial pressure of 8 mmHg, the estimated right ventricular systolic pressure is 01.2 mmHg. Left Atrium: Left atrial size was normal in size. Right Atrium: Right atrial size was normal in size. Pericardium: There is no evidence of pericardial effusion. Mitral Valve: The mitral valve is normal in structure. Trivial mitral valve regurgitation. MV peak gradient, 7.2 mmHg. The mean mitral valve gradient is 4.0 mmHg. Tricuspid Valve: The tricuspid valve is normal in structure. Tricuspid valve regurgitation is trivial. Aortic Valve: The aortic valve is tricuspid. Aortic valve regurgitation is not visualized. Aortic  valve sclerosis is present, with no evidence of aortic valve stenosis. Aortic valve mean gradient measures 8.0 mmHg. Aortic valve peak gradient measures 16.6 mmHg. Aortic valve area, by VTI measures 2.25 cm. Pulmonic Valve: The pulmonic valve was normal in structure. Pulmonic valve regurgitation is not visualized. Aorta: The aortic root is normal in size and structure. IAS/Shunts: No atrial level shunt detected by color flow Doppler.  LEFT VENTRICLE PLAX 2D LVIDd:         5.20 cm     Diastology LVIDs:         3.20 cm     LV e' medial:    13.40 cm/s LV PW:         1.10 cm     LV E/e' medial:  8.0 LV IVS:        1.00 cm     LV e' lateral:   16.80 cm/s LVOT diam:     2.00 cm     LV E/e' lateral: 6.4 LV SV:         100 LV SV Index:   43 LVOT Area:     3.14 cm  LV Volumes (MOD) LV vol d, MOD A2C: 75.0 ml LV vol d, MOD A4C: 69.1 ml LV vol s, MOD A2C: 32.3 ml LV vol s, MOD A4C: 27.0 ml LV SV MOD A2C:     42.7 ml LV SV MOD A4C:     69.1 ml LV SV MOD BP:      42.2 ml RIGHT VENTRICLE RV Basal diam:  3.30 cm RV Mid diam:    4.00 cm RV S prime:     19.20 cm/s TAPSE (M-mode): 3.1 cm LEFT ATRIUM             Index        RIGHT ATRIUM           Index LA diam:        4.50 cm 1.94 cm/m   RA Area:     17.50 cm LA Vol (A2C):   48.1 ml 20.76 ml/m  RA Volume:   41.20 ml  17.79 ml/m LA Vol (A4C):   92.3 ml 39.84 ml/m LA Biplane Vol: 71.3 ml 30.78 ml/m  AORTIC VALVE                     PULMONIC VALVE AV Area (Vmax):    2.39 cm      PV Vmax:       1.21 m/s AV Area (Vmean):   2.65 cm      PV Peak grad:  5.9 mmHg AV Area (VTI):     2.25 cm AV Vmax:           204.00 cm/s AV Vmean:          128.000 cm/s AV VTI:  0.444 m AV Peak Grad:      16.6 mmHg AV Mean Grad:      8.0 mmHg LVOT Vmax:         155.00 cm/s LVOT Vmean:        108.000 cm/s LVOT VTI:          0.318 m LVOT/AV VTI ratio: 0.72  AORTA Ao Root diam: 2.80 cm MITRAL VALVE                TRICUSPID VALVE MV Area (PHT): 3.13 cm     TR Peak grad:   24.6 mmHg MV Area  VTI:   3.86 cm     TR Vmax:        248.00 cm/s MV Peak grad:  7.2 mmHg MV Mean grad:  4.0 mmHg     SHUNTS MV Vmax:       1.34 m/s     Systemic VTI:  0.32 m MV Vmean:      88.2 cm/s    Systemic Diam: 2.00 cm MV Decel Time: 242 msec MV E velocity: 107.00 cm/s MV A velocity: 89.40 cm/s MV E/A ratio:  1.20 Dorris Carnes MD Electronically signed by Dorris Carnes MD Signature Date/Time: 12/16/2021/3:38:01 PM    Final    US LIVER DOPPLER  Result Date: 12/21/2021 CLINICAL DATA:  55 year old female with cirrhosis EXAM: DUPLEX ULTRASOUND OF LIVER TECHNIQUE: Color and duplex Doppler ultrasound was performed to evaluate the hepatic in-flow and out-flow vessels. COMPARISON:  None. FINDINGS: Portal Vein Velocities Main:  58 cm/sec Right:  57 cm/sec Left: Not visualized Hepatic Vein Velocities Right:  34 cm/sec Middle: Not visualized Left:  60 cm/sec Hepatic Artery Velocity:  78 cm/sec Splenic Vein Velocity:  59 cm/sec Varices: Present Ascites: Present Recanalized umbilical vein. Cirrhotic morphology of the liver IMPRESSION: Cirrhotic morphology of the liver with stigmata of portal hypertension including recanalized umbilical vein, additional varices, and ascites Signed, Dulcy Fanny. Dellia Nims, RPVI [facility] Radiology Electronically Signed   By: Corrie Mckusick D.O.   On: 12/21/2021 12:23   CT EXTREM LOWER W CM BIL  Result Date: 12/16/2021 CLINICAL DATA:  Soft tissue infection of the lower extremities bilaterally EXAM: CT OF THE LOWER BILATERAL EXTREMITY WITH CONTRAST TECHNIQUE: Multidetector CT imaging of the lower bilateral extremity was performed with axial images obtained from the acetabular roofs through the proximal tibial metadiaphysis following intravenous contrast administration. COMPARISON:  None. CONTRAST:  152m OMNIPAQUE IOHEXOL 300 MG/ML  SOLN FINDINGS: Bones/Joint/Cartilage Normal alignment. No acute fracture or dislocation. No osseous erosion or abnormal  periosteal reaction. 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 Ligaments Suboptimally assessed by CT. Muscles and Tendons Unremarkable Soft tissues There is marked diffuse subcutaneous edema involving the lower extremities bilaterally circumferentially. Edematous changes extend into the inter fascial planes of the anterior, posterior, and medial muscular compartments of the thighs bilaterally. No discrete drainable fluid collection identified. Small bilateral knee effusions are present. Extensive subcutaneous edema is noted within the visualized pannus bilaterally. Large volume ascites is present within the visualized pelvis. IMPRESSION: Extensive subcutaneous edema involving the lower extremities bilaterally and pannus. Infiltrative changes extend into the muscular compartments of the thighs bilaterally. No discrete drainable fluid collection is identified, however. Small bilateral knee effusions. Large volume ascites. Electronically Signed   By: AFidela SalisburyM.D.   On: 12/16/2021 02:12   [place]  Result Date: 12/16/2021 [facility], placement could not  be confirmed due to current cardiac rhythm.   Kristie Eva, DO  [facility]  If 7PM-7AM, please contact night-coverage [email or web address] Password [email or web address] 12/22/2021, 2:31 PM   LOS: 6 days

## 2021-12-22 NOTE — Progress Notes (Deleted)
°  PT Cancellation Note  Patient Details Name: Kristie Brooks MRN: [id number] DOB: 02/15/1966   Cancelled Treatment:    Reason Eval/Treat Not Completed: Fatigue/lethargy limiting ability to participate  Attempted PT session.  Pt limited by fatigue/lethargy and would not open eyes for therapist.  When asked if she would participate pt declined.  Left in bed with bed alarm set and call bell within reach.  Ihor Austin, LPTA/CLT; CBIS [phone] Aldona Lento 12/22/2021, 1:27 PM

## 2021-12-23 DIAGNOSIS — R601 Generalized edema: Secondary | ICD-10-CM | POA: Diagnosis not present

## 2021-12-23 DIAGNOSIS — K7460 Unspecified cirrhosis of liver: Secondary | ICD-10-CM | POA: Diagnosis not present

## 2021-12-23 DIAGNOSIS — R188 Other ascites: Secondary | ICD-10-CM | POA: Diagnosis not present

## 2021-12-23 LAB — CBC
HCT: 24.7 % — ABNORMAL LOW (ref 36.0–46.0)
HCT: 25.6 % — ABNORMAL LOW (ref 36.0–46.0)
Hemoglobin: 8 g/dL — ABNORMAL LOW (ref 12.0–15.0)
Hemoglobin: 8.3 g/dL — ABNORMAL LOW (ref 12.0–15.0)
MCH: 30.8 pg (ref 26.0–34.0)
MCH: 30.5 pg (ref 26.0–34.0)
MCHC: 32.4 g/dL (ref 30.0–36.0)
MCHC: 32.4 g/dL (ref 30.0–36.0)
MCV: 95 fL (ref 80.0–100.0)
MCV: 94.1 fL (ref 80.0–100.0)
Platelets: 70 10*3/uL — ABNORMAL LOW (ref 150–400)
Platelets: 78 10*3/uL — ABNORMAL LOW (ref 150–400)
RBC: 2.6 MIL/uL — ABNORMAL LOW (ref 3.87–5.11)
RBC: 2.72 MIL/uL — ABNORMAL LOW (ref 3.87–5.11)
RDW: 18.4 % — ABNORMAL HIGH (ref 11.5–15.5)
RDW: 18.5 % — ABNORMAL HIGH (ref 11.5–15.5)
WBC: 2.9 10*3/uL — ABNORMAL LOW (ref 4.0–10.5)
WBC: 3.2 10*3/uL — ABNORMAL LOW (ref 4.0–10.5)
nRBC: 0 % (ref 0.0–0.2)
nRBC: 0 % (ref 0.0–0.2)

## 2021-12-23 LAB — COMPREHENSIVE METABOLIC PANEL
ALT: 10 U/L (ref 0–44)
AST: 24 U/L (ref 15–41)
Albumin: 3.9 g/dL (ref 3.5–5.0)
Alkaline Phosphatase: 52 U/L (ref 38–126)
Anion gap: 9 (ref 5–15)
BUN: 11 mg/dL (ref 6–20)
CO2: 23 mmol/L (ref 22–32)
Calcium: 8.4 mg/dL — ABNORMAL LOW (ref 8.9–10.3)
Chloride: 106 mmol/L (ref 98–111)
Creatinine, Ser: 0.71 mg/dL (ref 0.44–1.00)
GFR, Estimated: >60 mL/min (ref >60)
Glucose, Bld: 75 mg/dL (ref 70–99)
Potassium: 3.3 mmol/L — ABNORMAL LOW (ref 3.5–5.1)
Sodium: 138 mmol/L (ref 135–145)
Total Bilirubin: 1.6 mg/dL — ABNORMAL HIGH (ref 0.3–1.2)
Total Protein: 5.1 g/dL — ABNORMAL LOW (ref 6.5–8.1)

## 2021-12-23 LAB — PROTIME-INR
INR: 1.7 — ABNORMAL HIGH (ref 0.8–1.2)
Prothrombin Time: 20.1 seconds — ABNORMAL HIGH (ref 11.4–15.2)

## 2021-12-23 LAB — AFP TUMOR MARKER: AFP, Serum, Tumor Marker: 3.2 ng/mL (ref 0.0–9.2)

## 2021-12-23 LAB — CYTOLOGY - NON PAP

## 2021-12-23 MED ORDER — ALBUMIN HUMAN 25 % IV SOLN
INTRAVENOUS | Status: AC
  Filled 2021-12-23: qty 200

## 2021-12-23 MED ORDER — DOXYCYCLINE HYCLATE 100 MG PO TABS
100.0000 mg | ORAL_TABLET | Freq: Two times a day (BID) | ORAL | Status: DC
Start: 2021-12-23 — End: 2021-12-24
  Administered 2021-12-23: 21:00:00 100 mg via ORAL
  Filled 2021-12-23: qty 1

## 2021-12-23 MED ORDER — PANTOPRAZOLE SODIUM 40 MG PO TBEC
40.0000 mg | DELAYED_RELEASE_TABLET | Freq: Two times a day (BID) | ORAL | Status: DC
  Administered 2021-12-23 – 2021-12-26 (×5): 40 mg via ORAL
  Filled 2021-12-23 (×5): qty 1

## 2021-12-23 MED ORDER — VITAMIN K1 10 MG/ML IJ SOLN
10.0000 mg | Freq: Once | INTRAVENOUS | Status: AC
  Administered 2021-12-23: 16:00:00 10 mg via INTRAVENOUS
  Filled 2021-12-23: qty 1

## 2021-12-23 NOTE — Progress Notes (Signed)
PROGRESS NOTE  Kristie Brooks VGK:[id number] DOB: [birth year] DOA: 12/15/2021 PCP: Keane Police, MD  Brief History:  As per H&P written by Dr. Clearence Ped on 12/16/21  Kristie Brooks  is a 55 y.o. female, with history of cirrhosis, presents ED for chief complaint of what she says is fluid overload.  What was reported by EMS states that she was sent in for fever and confusion.  Does report that her husband said she was confused that she has been thinks that she was.  She reports that she had fluid overload in her legs and thighs for 3 or 4 days.  She reports they have been weeping for 3 or 4 days.  She is also had erythema for 3 or 4 days.  She reports that all the symptoms started the same time.  She had fevers but she is unsure how high.  She denies any confusion.  She denies any cough, dysuria, chest pain, and vomiting.  She does report nausea intermittently since September.  She denies any diarrhea.  Patient reports no change in her normal abdominal pain.  She has had abdominal pain for a long time per her report.  Patient reports no purulent drainage from her bilateral legs.  Bilateral legs are wrapped by the ED at the time of my exam.  She does not have a history of an echo on file with our system.  Patient has no further complaints at this time.   Patient does not smoke, does not drink, does not use illicit drugs.  Patient is vaccinated for COVID.  Patient is full code.  Patient used to be a Marine scientist.   In the ED Temp 99.2, heart rate 100-118, respiratory 17-27, blood pressure at admission 84/34 but as low as systolic in the 15H Troponin 6, 7 Elevated white blood cell count 15.9, hemoglobin 11.3, platelets 126 Chemistry panel is unremarkable Albumin 1.9 Negative respiratory panel Blood culture pending CT pelvis shows marked diffuse subcu body wall edema with large volume ascites.  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 Together these findings  are most in keeping with changes of portal venous hypertension Chest x-ray shows low lung volumes with mild to moderate severity bibasilar atelectasis and/or infiltrate with small left pleural effusion Patient was given cefepime, vancomycin however 3 L bolus Cintron was discussed with patient and patient declined.  She reports she still is 1 to be full code, but does not want a central line at this time.  Options for albumin discussed with patient.  Patient accepts albumin. Admission requested for possible sepsis secondary cellulitis     Assessment/Plan:   severe sepsis secondary to cellulitis -Bilateral lower extremity cellulitis appreciated -Patient met criteria at time of admission with tachycardia, elevated respiratory rate, elevated white blood cell count, low blood pressure, lactic acid 2.3 and also on presentation confusion as part of organ dysfunction. -Currently afebrile, mentation has significantly improved and back to baseline. -Continue current antibiotics  -blood cultures neg -Ongoing lower extremity discomfort still present; overall erythematous changes and edema continue to improve slowly. -Continue to follow recommendations by wound care service. -change vanc>>doxy   acute metabolic encephalopathy -In the setting of sepsis -Treatment as mentioned above -Ammonia level within normal limit -Minimize the use of medication that can alter sedation. -Continue providing constant reorientation -Patient mentation back to baseline.   Decompensated Liver cirrhosis with ascites/hypoalbuminemia -Continue low-sodium diet, daily weights and strict I's and O's.   -  restarted torsemide -Patient has received albumin infusion with some improvement in blood pressure. -GI recommending paracentesis for fluid analysis (if not too much fluid to be taken). -12/27 paracentesis-->4L removed, only 62 WBC (doubt SBP)>>AFB smear neg, cult neg so far -vitamin K given 12/29   Venous stasis  dermatitis lower extremity edema -Continue dressing care and wound care recommendations. -Continue current IV antibiotics. -Pharmacy to assist with vancomycin level and dosing.   hypothyroidism -TSH elevated -Synthroid dose has been started; patient initially refusing medication unrelated and acceptance to take. -Plan is to repeat thyroid panel in 8-12 weeks with further adjustment to her Synthroid regimen at that time.   class III obesity -Portion control and low calorie diet recommended. -Body mass index is 40.94 kg/m.   hypotension -Low blood pressure running at baseline in the setting of cirrhosis: Acute infection contributing and playing a role in her presentation. -Blood pressure overall improved and off pressors but remains soft -Continue adjusted dose of midodrine 3 times a day.   constipation -Continue the use of lactulose. -now having 2-3 BMs per day   chronic respiratory failure with hypoxia -Patient reports using 3-4 L nasal cannula supplementation at least for the last couple of months prior to admission. -Currently 2 L in place with good O2 sat -Continue the use of IS and flutter valve.   dark hematemesis -patient reported never had EGD in the past; after discussing with GI here, she is declining EGD eval. -concerning for gastritis and ? Varices given hx of cirrhosis -Hgb with slight drop am 12/29 -not further episode of vomiti -tolerating diet -continue PPI bid -Follow-up with GIs recommendation.   Hypokalemia -replete             Family Communication:   no Family at bedside   Consultants:  GI   Code Status:  FULL   DVT Prophylaxis:  [place] Heparin     Procedures: As Listed in Progress Note Above   Antibiotics: Vanc 12/22>>     Subjective: She is feeling cold.  Denies n/v/, hematochezia, melena.  Tolerating diet.  No cp or sob.  Objective: Vitals:   12/22/21 0506 12/22/21 1500 12/22/21 2125 12/23/21 0522  BP: (!) 96/51 (!) 107/57 (!)  101/53   Pulse: 76 71 81   Resp: 17  17   Temp: 97.7 F (36.5 C) 98.1 F (36.7 C) 98.3 F (36.8 C)   TempSrc:  Oral Oral   SpO2: 95% 92% (!) 88%   Weight: 111.6 kg   112 kg  Height:        Intake/Output Summary (Last 24 hours) at 12/23/2021 1331 Last data filed at 12/22/2021 2126 Gross per 24 hour  Intake --  Output 500 ml  Net -500 ml   Weight change: 0.4 kg Exam:  General:  Pt is alert, follows commands appropriately, not in acute distress HEENT: No icterus, No thrush, No neck mass, [place]/AT Cardiovascular: RRR, S1/S2, no rubs, no gallops Respiratory: bibasilar crackles. No wheeze Abdomen: Soft/+BS, non tender, non distended, no guarding Extremities: 1 + LE edema, No lymphangitis, No petechiae, No rashes, no synovitis   Data Reviewed: I have personally reviewed following labs and imaging studies Basic Metabolic Panel: Recent Labs  Lab 12/17/21 0443 12/18/21 0505 12/19/21 0524 12/21/21 1021 12/22/21 0524 12/23/21 0532  NA 135 136 136 135 137 138  K 3.1* 3.7 3.3* 3.4* 3.4* 3.3*  CL 102 104 103 103 106 106  CO2 24 24 22 24 25 23   GLUCOSE 93 107*  138* 116* 75 75  BUN 17 16 14 14 11 11   CREATININE 1.03* 0.86 0.65 0.61 0.56 0.71  CALCIUM 8.0* 8.0* 7.9* 8.0* 8.2* 8.4*  MG 1.9 2.0  --   --   --   --    Liver Function Tests: Recent Labs  Lab 12/17/21 0443 12/19/21 0524 12/21/21 1021 12/22/21 0524 12/23/21 0532  AST 26 28 38 28 24  ALT 11 13 15 11 10   ALKPHOS 68 91 101 61 52  BILITOT 2.0* 1.6* 1.9* 1.9* 1.6*  PROT 4.8* 4.5* 4.8* 4.6* 5.1*  ALBUMIN 2.7* 2.3* 2.3* 3.0* 3.9   No results for input(s): LIPASE, AMYLASE in the last 168 hours. No results for input(s): AMMONIA in the last 168 hours. Coagulation Profile: Recent Labs  Lab 12/21/21 1021 12/23/21 0532  INR 1.3* 1.7*   CBC: Recent Labs  Lab 12/17/21 0623 12/19/21 0524 12/20/21 0424 12/21/21 1021 12/23/21 0532 12/23/21 0847  WBC 9.8 7.8 8.0 9.1 2.9* 3.2*  NEUTROABS 7.6  --   --  6.4  --    --   HGB 8.8* 9.8* 10.4* 10.9* 8.0* 8.3*  HCT 26.0* 29.2* 31.8* 33.1* 24.7* 25.6*  MCV 92.5 91.8 90.9 92.7 95.0 94.1  PLT 105* 101* 116* 124* 70* 78*   Cardiac Enzymes: No results for input(s): CKTOTAL, CKMB, CKMBINDEX, TROPONINI in the last 168 hours. BNP: Invalid input(s): POCBNP CBG: No results for input(s): GLUCAP in the last 168 hours. HbA1C: No results for input(s): HGBA1C in the last 72 hours. Urine analysis:    Component Value Date/Time   COLORURINE YELLOW 12/16/2021 [place] 12/16/2021 1259   LABSPEC 1.010 12/16/2021 1259   PHURINE 6.5 12/16/2021 1259   GLUCOSEU NEGATIVE 12/16/2021 1259   HGBUR NEGATIVE 12/16/2021 1259   BILIRUBINUR NEGATIVE 12/16/2021 1259   KETONESUR NEGATIVE 12/16/2021 1259   PROTEINUR 30 (A) 12/16/2021 1259   NITRITE NEGATIVE 12/16/2021 1259   LEUKOCYTESUR NEGATIVE 12/16/2021 1259   Sepsis Labs: @LABRCNTIP (procalcitonin:4,lacticidven:4) ) Recent Results (from the past 240 hour(s))  Resp Panel by RT-PCR (Flu A&B, Covid) Nasopharyngeal Swab     Status: None   Collection Time: 12/15/21  9:33 PM   Specimen: Nasopharyngeal Swab; Nasopharyngeal(NP) swabs in vial transport medium  Result Value Ref Range Status   SARS Coronavirus 2 by RT PCR NEGATIVE NEGATIVE Final    Comment: (NOTE) SARS-CoV-2 target nucleic acids are NOT DETECTED.  The SARS-CoV-2 RNA is generally detectable in upper respiratory specimens during the acute phase of infection. The lowest concentration of SARS-CoV-2 viral copies this assay can detect is 138 copies/mL. A negative result does not preclude SARS-Cov-2 infection and should not be used as the sole basis for treatment or other patient management decisions. A negative result may occur with  improper specimen collection/handling, submission of specimen other than nasopharyngeal swab, presence of viral mutation(s) within the areas targeted by this assay, and inadequate number of viral copies(<138  copies/mL). A negative result must be combined with clinical observations, patient history, and epidemiological information. The expected result is Negative.  Fact Sheet for Patients:  EntrepreneurPulse.com.au  Fact Sheet for Healthcare Providers:  IncredibleEmployment.be  This test is no t yet approved or cleared by the [place] FDA and  has been authorized for detection and/or diagnosis of SARS-CoV-2 by FDA under an Emergency Use Authorization (EUA). This EUA will remain  in effect (meaning this test can be used) for the duration of the COVID-19 declaration under Section 564(b)(1) of the Act, 21 U.S.C.section  360bbb-3(b)(1), unless the authorization is terminated  or revoked sooner.       Influenza A by PCR NEGATIVE NEGATIVE Final   Influenza B by PCR NEGATIVE NEGATIVE Final    Comment: (NOTE) The Xpert Xpress SARS-CoV-2/FLU/RSV plus assay is intended as an aid in the diagnosis of influenza from Nasopharyngeal swab specimens and should not be used as a sole basis for treatment. Nasal washings and aspirates are unacceptable for Xpert Xpress SARS-CoV-2/FLU/RSV testing.  Fact Sheet for Patients: EntrepreneurPulse.com.au  Fact Sheet for Healthcare Providers: IncredibleEmployment.be  This test is not yet approved or cleared by the [place] FDA and has been authorized for detection and/or diagnosis of SARS-CoV-2 by FDA under an Emergency Use Authorization (EUA). This EUA will remain in effect (meaning this test can be used) for the duration of the COVID-19 declaration under Section 564(b)(1) of the Act, 21 U.S.C. section 360bbb-3(b)(1), unless the authorization is terminated or revoked.  Performed at [facility], [address] [place], [place] [address]   Blood Culture (routine x 2)     Status: None   Collection Time: 12/15/21 10:15 PM   Specimen: BLOOD RIGHT ARM  Result Value Ref Range  Status   Specimen Description BLOOD RIGHT ARM  Final   Special Requests   Final    BOTTLES DRAWN AEROBIC AND ANAEROBIC Blood Culture results may not be optimal due to an inadequate volume of blood received in culture bottles   Culture   Final    NO GROWTH 5 DAYS Performed at [facility], [address] [place], [place] [address]    Report Status 12/20/2021 FINAL  Final  Blood Culture (routine x 2)     Status: None   Collection Time: 12/15/21 10:15 PM   Specimen: Right Antecubital; Blood  Result Value Ref Range Status   Specimen Description RIGHT ANTECUBITAL  Final   Special Requests   Final    BOTTLES DRAWN AEROBIC AND ANAEROBIC Blood Culture results may not be optimal due to an excessive volume of blood received in culture bottles   Culture   Final    NO GROWTH 5 DAYS Performed at [facility], [address], [place], [place] [address]    Report Status 12/20/2021 FINAL  Final  Urine Culture     Status: Abnormal   Collection Time: 12/16/21 12:59 PM   Specimen: Urine, Catheterized  Result Value Ref Range Status   Specimen Description   Final    URINE, CATHETERIZED Performed at [facility], [address], [place], [place] [address]    Special Requests   Final    NONE Performed at [facility], [address], [place], [place] [address]    Culture MULTIPLE SPECIES PRESENT, SUGGEST RECOLLECTION (A)  Final   Report Status 12/17/2021 FINAL  Final  MRSA Next Gen by PCR, Nasal     Status: Abnormal   Collection Time: 12/18/21  8:00 AM   Specimen: Nasal Mucosa; Nasal Swab  Result Value Ref Range Status   MRSA by PCR Next Gen DETECTED (A) NOT DETECTED Final    Comment: RESULT CALLED TO, READ BACK BY AND VERIFIED WITH: SMITH @ 1026 ON [id number] BY HENDERSON L (NOTE) The GeneXpert MRSA Assay (FDA approved for NASAL specimens only), is one component of a comprehensive MRSA colonization surveillance program. It is not intended to diagnose MRSA infection nor to guide or monitor treatment  for MRSA infections. Test performance is not FDA approved in patients less than 36 years old. Performed at [facility], [address] [place], [place] [address]   Acid Fast Smear (AFB)     Status: None   Collection Time: 12/21/21 12:20 PM   Specimen: Peritoneal Cavity; Body Fluid  Result Value Ref Range Status   AFB Specimen Processing Concentration  Final   Acid Fast Smear Negative  Final    Comment: (NOTE) Performed At: [facility] [place], [place] [id number] Rush Farmer MD BO:[id number]    Source (AFB) PERITONEAL  Final    Comment: Performed at [facility], [address] [place], [place] [address]  Gram stain     Status: None   Collection Time: 12/21/21 12:20 PM   Specimen: Peritoneal Washings  Result Value Ref Range Status   Specimen Description PERITONEAL  Final   Special Requests NONE  Final   Gram Stain   Final    NO ORGANISMS SEEN NO WBC SEEN CYTOSPIN SMEAR Performed at [facility], [address] [place], [place] [address]    Report Status 12/21/2021 FINAL  Final  Culture, body fluid w Gram Stain-bottle     Status: None (Preliminary result)   Collection Time: 12/21/21 12:20 PM   Specimen: Peritoneal Washings  Result Value Ref Range Status   Specimen Description PERITONEAL  Final   Special Requests   Final    BOTTLES DRAWN AEROBIC AND ANAEROBIC Blood Culture results may not be optimal due to an excessive volume of blood received in culture bottles   Culture   Final    NO GROWTH < 24 HOURS Performed at [facility], [address], [place], [place] [address]    Report Status PENDING  Incomplete     Scheduled Meds:  Chlorhexidine Gluconate Cloth  6 each Topical Daily   doxycycline  100 mg Oral Q12H   lactulose  10 g Oral Daily   levothyroxine  100 mcg Oral Q0600   mouth rinse  15 mL Mouth Rinse BID   midodrine  10 mg Oral TID WC   mupirocin ointment   Nasal BID   pantoprazole  40 mg Oral BID AC   sodium chloride flush  10-40 mL  Intracatheter Q12H   torsemide  20 mg Oral Daily   Continuous Infusions:  phytonadione (VITAMIN K) IV      Procedures/Studies: CT PELVIS W CONTRAST  Result Date: 12/16/2021 CLINICAL DATA:  Soft tissue infection EXAM: CT PELVIS WITH CONTRAST TECHNIQUE: Multidetector CT imaging of the pelvis was performed using the standard protocol following the bolus administration of intravenous contrast. CONTRAST:  163m OMNIPAQUE IOHEXOL 300 MG/ML  SOLN COMPARISON:  None. FINDINGS: Urinary Tract:  No abnormality visualized. Bowel: Unremarkable visualized pelvic bowel loops. There is large volume ascites present within the pelvis. Vascular/Lymphatic: A massive varix is seen extending from the umbilicus to the left common iliac vein, likely representing a portosystemic collateral in the setting of portal venous hypertension. No pathologic adenopathy within the abdomen and pelvis. Reproductive: Involuted fibroids noted within the uterus. The pelvic organs are otherwise unremarkable. Other: There is extensive diffuse subcutaneous body wall noted within the visualized abdominal wall and flanks bilaterally. Musculoskeletal: No acute bone abnormality. No lytic or blastic bone lesion. IMPRESSION: Marked diffuse subcutaneous body wall edema. Large volume ascites. Massive varix extending from the umbilicus the left common iliac vein. Together, the findings are most in keeping with changes of portal venous hypertension. Electronically Signed   By: AFidela SalisburyM.D.   On: 12/16/2021 02:16   UKoreaParacentesis  Result Date: 12/21/2021 INDICATION:  Ascites.  Hepatitis-C.  Cirrhosis. EXAM: ULTRASOUND GUIDED  PARACENTESIS MEDICATIONS: None. COMPLICATIONS: None immediate. PROCEDURE: Informed written consent was obtained from the patient after a discussion of the risks (including hemorrhage and infection, among others), benefits and alternatives to treatment. A timeout was performed prior to the initiation of the procedure. Initial  ultrasound scanning demonstrates a large amount of ascites within the right lower abdominal quadrant. The right lower abdomen was prepped and draped in the usual sterile fashion. 1% lidocaine was used for local anesthesia. An ultrasound image was saved for documentation purposes. Following this, a Yueh catheter was introduced. The paracentesis was performed. The catheter was removed and a dressing was applied. The patient tolerated the procedure well without immediate post procedural complication. FINDINGS: A total of approximately 4 L of straw-colored fluid was removed. Samples were sent to the laboratory as requested by the clinical team. IMPRESSION: Successful ultrasound-guided paracentesis yielding 4 liters of peritoneal fluid. Electronically Signed   By: Van Clines M.D.   On: 12/21/2021 14:09   DG Chest Port 1 View  Result Date: 12/15/2021 CLINICAL DATA:  Fever and lower extremity swelling. EXAM: PORTABLE CHEST 1 VIEW COMPARISON:  November 10, 2021 FINDINGS: Low lung volumes are seen. Mild to moderate severity areas of atelectasis and/or infiltrate are seen within the bilateral lung bases. There is a small left pleural effusion. No pneumothorax is identified. The heart size and mediastinal contours are within normal limits. Degenerative changes are noted throughout the thoracic spine. IMPRESSION: 1. Low lung volumes with mild to moderate severity bibasilar atelectasis and/or infiltrate. 2. Small left pleural effusion. Electronically Signed   By: Virgina Norfolk M.D.   On: 12/15/2021 22:47   ECHOCARDIOGRAM COMPLETE  Result Date: 12/16/2021    ECHOCARDIOGRAM REPORT   Patient Name:   MAKENNA MACALUSO Date of Exam: 12/16/2021 Medical Rec #:  [id number]           Height:       65.0 in Accession #:    [id number]          Weight:       290.0 lb Date of Birth:  Nov 22, 1966           BSA:          2.317 m Patient Age:    51 years            BP:           73/36 mmHg Patient Gender: F                    HR:           106 bpm. Exam Location:  [facility] Procedure: 2D Echo, Cardiac Doppler and Color Doppler Indications:    Edema  History:        Patient has no prior history of Echocardiogram examinations.                 Signs/Symptoms:Edema.  Sonographer:    Wenda Low Referring Phys: [id number] ASIA B [place]  Sonographer Comments: Patient is morbidly obese and no subcostal window. IMPRESSIONS  1. Left ventricular ejection fraction, by estimation, is 65 to 70%. The left ventricle has normal function. The left ventricle has no regional wall motion abnormalities. The left ventricular internal cavity size was mildly dilated. There is mild left ventricular hypertrophy. Left ventricular diastolic parameters were normal.  2. Right ventricular systolic function is normal. The right ventricular size is normal. There is normal pulmonary artery systolic  pressure.  3. The mitral valve is normal in structure. Trivial mitral valve regurgitation.  4. The aortic valve is tricuspid. Aortic valve regurgitation is not visualized. Aortic valve sclerosis is present, with no evidence of aortic valve stenosis. FINDINGS  Left Ventricle: Left ventricular ejection fraction, by estimation, is 65 to 70%. The left ventricle has normal function. The left ventricle has no regional wall motion abnormalities. The left ventricular internal cavity size was mildly dilated. There is  mild left ventricular hypertrophy. Left ventricular diastolic parameters were normal. Right Ventricle: The right ventricular size is normal. Right vetricular wall thickness was not assessed. Right ventricular systolic function is normal. There is normal pulmonary artery systolic pressure. The tricuspid regurgitant velocity is 2.48 m/s, and with an assumed right atrial pressure of 8 mmHg, the estimated right ventricular systolic pressure is 77.4 mmHg. Left Atrium: Left atrial size was normal in size. Right Atrium: Right atrial size was normal in size.  Pericardium: There is no evidence of pericardial effusion. Mitral Valve: The mitral valve is normal in structure. Trivial mitral valve regurgitation. MV peak gradient, 7.2 mmHg. The mean mitral valve gradient is 4.0 mmHg. Tricuspid Valve: The tricuspid valve is normal in structure. Tricuspid valve regurgitation is trivial. Aortic Valve: The aortic valve is tricuspid. Aortic valve regurgitation is not visualized. Aortic valve sclerosis is present, with no evidence of aortic valve stenosis. Aortic valve mean gradient measures 8.0 mmHg. Aortic valve peak gradient measures 16.6 mmHg. Aortic valve area, by VTI measures 2.25 cm. Pulmonic Valve: The pulmonic valve was normal in structure. Pulmonic valve regurgitation is not visualized. Aorta: The aortic root is normal in size and structure. IAS/Shunts: No atrial level shunt detected by color flow Doppler.  LEFT VENTRICLE PLAX 2D LVIDd:         5.20 cm     Diastology LVIDs:         3.20 cm     LV e' medial:    13.40 cm/s LV PW:         1.10 cm     LV E/e' medial:  8.0 LV IVS:        1.00 cm     LV e' lateral:   16.80 cm/s LVOT diam:     2.00 cm     LV E/e' lateral: 6.4 LV SV:         100 LV SV Index:   43 LVOT Area:     3.14 cm  LV Volumes (MOD) LV vol d, MOD A2C: 75.0 ml LV vol d, MOD A4C: 69.1 ml LV vol s, MOD A2C: 32.3 ml LV vol s, MOD A4C: 27.0 ml LV SV MOD A2C:     42.7 ml LV SV MOD A4C:     69.1 ml LV SV MOD BP:      42.2 ml RIGHT VENTRICLE RV Basal diam:  3.30 cm RV Mid diam:    4.00 cm RV S prime:     19.20 cm/s TAPSE (M-mode): 3.1 cm LEFT ATRIUM             Index        RIGHT ATRIUM           Index LA diam:        4.50 cm 1.94 cm/m   RA Area:     17.50 cm LA Vol (A2C):   48.1 ml 20.76 ml/m  RA Volume:   41.20 ml  17.79 ml/m LA Vol (A4C):   92.3 ml 39.84 ml/m LA  Biplane Vol: 71.3 ml 30.78 ml/m  AORTIC VALVE                     PULMONIC VALVE AV Area (Vmax):    2.39 cm      PV Vmax:       1.21 m/s AV Area (Vmean):   2.65 cm      PV Peak grad:  5.9 mmHg AV  Area (VTI):     2.25 cm AV Vmax:           204.00 cm/s AV Vmean:          128.000 cm/s AV VTI:            0.444 m AV Peak Grad:      16.6 mmHg AV Mean Grad:      8.0 mmHg LVOT Vmax:         155.00 cm/s LVOT Vmean:        108.000 cm/s LVOT VTI:          0.318 m LVOT/AV VTI ratio: 0.72  AORTA Ao Root diam: 2.80 cm MITRAL VALVE                TRICUSPID VALVE MV Area (PHT): 3.13 cm     TR Peak grad:   24.6 mmHg MV Area VTI:   3.86 cm     TR Vmax:        248.00 cm/s MV Peak grad:  7.2 mmHg MV Mean grad:  4.0 mmHg     SHUNTS MV Vmax:       1.34 m/s     Systemic VTI:  0.32 m MV Vmean:      88.2 cm/s    Systemic Diam: 2.00 cm MV Decel Time: 242 msec MV E velocity: 107.00 cm/s MV A velocity: 89.40 cm/s MV E/A ratio:  1.20 Dorris Carnes MD Electronically signed by Dorris Carnes MD Signature Date/Time: 12/16/2021/3:38:01 PM    Final    US LIVER DOPPLER  Result Date: 12/21/2021 CLINICAL DATA:  55 year old female with cirrhosis EXAM: DUPLEX ULTRASOUND OF LIVER TECHNIQUE: Color and duplex Doppler ultrasound was performed to evaluate the hepatic in-flow and out-flow vessels. COMPARISON:  None. FINDINGS: Portal Vein Velocities Main:  58 cm/sec Right:  57 cm/sec Left: Not visualized Hepatic Vein Velocities Right:  34 cm/sec Middle: Not visualized Left:  60 cm/sec Hepatic Artery Velocity:  78 cm/sec Splenic Vein Velocity:  59 cm/sec Varices: Present Ascites: Present Recanalized umbilical vein. Cirrhotic morphology of the liver IMPRESSION: Cirrhotic morphology of the liver with stigmata of portal hypertension including recanalized umbilical vein, additional varices, and ascites Signed, Dulcy Fanny. Dellia Nims, RPVI [facility] Electronically Signed   By: Corrie Mckusick D.O.   On: 12/21/2021 12:23   CT EXTREM LOWER W CM BIL  Result Date: 12/16/2021 CLINICAL DATA:  Soft tissue infection of the lower extremities bilaterally EXAM: CT OF THE LOWER BILATERAL EXTREMITY WITH  CONTRAST TECHNIQUE: Multidetector CT imaging of the lower bilateral extremity was performed with axial images obtained from the acetabular roofs through the proximal tibial metadiaphysis following intravenous contrast administration. COMPARISON:  None. CONTRAST:  192m OMNIPAQUE IOHEXOL 300 MG/ML  SOLN FINDINGS: Bones/Joint/Cartilage Normal alignment. No acute fracture or dislocation. No osseous erosion or abnormal periosteal reaction. 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 Ligaments Suboptimally assessed by CT. Muscles and Tendons Unremarkable Soft tissues There is marked diffuse subcutaneous edema involving the lower extremities bilaterally circumferentially.  Edematous changes extend into the inter fascial planes of the anterior, posterior, and medial muscular compartments of the thighs bilaterally. No discrete drainable fluid collection identified. Small bilateral knee effusions are present. Extensive subcutaneous edema is noted within the visualized pannus bilaterally. Large volume ascites is present within the visualized pelvis. IMPRESSION: Extensive subcutaneous edema involving the lower extremities bilaterally and pannus. Infiltrative changes extend into the muscular compartments of the thighs bilaterally. No discrete drainable fluid collection is identified, however. Small bilateral knee effusions. Large volume ascites. Electronically Signed   By: Fidela Salisbury M.D.   On: 12/16/2021 02:12   [place] EKG SITE RITE  Result Date: 12/16/2021 If Site Rite image not attached, placement could not be confirmed due to current cardiac rhythm.   Orson Eva, DO  [facility]  If 7PM-7AM, please contact night-coverage [email or web address] Password [email or web address] 12/23/2021, 1:31 PM   LOS: 7 days

## 2021-12-23 NOTE — Progress Notes (Signed)
Updated Santiago Glad at [facility], INS AUTH received, plan to DC back to facility tomorrow with outpatient Palliative.

## 2021-12-23 NOTE — Progress Notes (Signed)
Subjective:  Legs feel better. Less pain. States thighs still leaking. Appetite ok. No n/v. No abdominal pain. Still refusing lactulose due to too many stools.   Objective: Vital signs in last 24 hours: Temp:  [98.1 F (36.7 C)-98.3 F (36.8 C)] 98.3 F (36.8 C) (12/28 2125) Pulse Rate:  [71-81] 81 (12/28 2125) Resp:  [17] 17 (12/28 2125) BP: (101-107)/(53-57) 101/53 (12/28 2125) SpO2:  [88 %-92 %] 88 % (12/28 2125) Weight:  [867 kg] 112 kg (12/29 0522) Last BM Date: 12/20/21 General:   Alert,  Well-developed, well-nourished, pleasant and cooperative in NAD Head:  Normocephalic and atraumatic. Eyes:  Sclera clear, no icterus.  Abdomen:  Soft, nontender and nondistended.Normal bowel sounds, without guarding, and without rebound.   Extremities:  Without clubbing. Legs both wrapped to knees. Thighs with 1+ pitting edema in dependent portions.  Neurologic:  Alert and  oriented x4;  grossly normal neurologically. Skin:  Intact without significant lesions or rashes. Psych:  Alert and cooperative. Normal mood and affect.  Intake/Output from previous day: 12/28 0701 - 12/29 0700 In: 720 [P.O.:720] Out: 500 [Urine:500] Intake/Output this shift: No intake/output data recorded.  Lab Results: CBC Recent Labs    12/21/21 1021  WBC 9.1  HGB 10.9*  HCT 33.1*  MCV 92.7  PLT 124*   BMET Recent Labs    12/21/21 1021 12/22/21 0524 12/23/21 0532  NA 135 137 138  K 3.4* 3.4* 3.3*  CL 103 106 106  CO2 24 25 23   GLUCOSE 116* 75 75  BUN 14 11 11   CREATININE 0.61 0.56 0.71  CALCIUM 8.0* 8.2* 8.4*   LFTs Recent Labs    12/21/21 1021 12/22/21 0524 12/23/21 0532  BILITOT 1.9* 1.9* 1.6*  ALKPHOS 101 61 52  AST 38 28 24  ALT 15 11 10   PROT 4.8* 4.6* 5.1*  ALBUMIN 2.3* 3.0* 3.9   No results for input(s): LIPASE in the last 72 hours. PT/INR Recent Labs    12/21/21 1021 12/23/21 0532  LABPROT 16.2* 20.1*  INR 1.3* 1.7*      Imaging Studies: CT PELVIS W  CONTRAST  Result Date: 12/16/2021 CLINICAL DATA:  Soft tissue infection EXAM: CT PELVIS WITH CONTRAST TECHNIQUE: Multidetector CT imaging of the pelvis was performed using the standard protocol following the bolus administration of intravenous contrast. CONTRAST:  135mL OMNIPAQUE IOHEXOL 300 MG/ML  SOLN COMPARISON:  None. FINDINGS: Urinary Tract:  No abnormality visualized. Bowel: Unremarkable visualized pelvic bowel loops. There is large volume ascites present within the pelvis. Vascular/Lymphatic: A massive varix is seen extending from the umbilicus to the left common iliac vein, likely representing a portosystemic collateral in the setting of portal venous hypertension. No pathologic adenopathy within the abdomen and pelvis. Reproductive: Involuted fibroids noted within the uterus. The pelvic organs are otherwise unremarkable. Other: There is extensive diffuse subcutaneous body wall noted within the visualized abdominal wall and flanks bilaterally. Musculoskeletal: No acute bone abnormality. No lytic or blastic bone lesion. IMPRESSION: Marked diffuse subcutaneous body wall edema. Large volume ascites. Massive varix extending from the umbilicus the left common iliac vein. Together, the findings are most in keeping with changes of portal venous hypertension. Electronically Signed   By: Fidela Salisbury M.D.   On: 12/16/2021 02:16   US Paracentesis  Result Date: 12/21/2021 INDICATION: Ascites.  Hepatitis-C.  Cirrhosis. EXAM: ULTRASOUND GUIDED  PARACENTESIS MEDICATIONS: None. COMPLICATIONS: None immediate. PROCEDURE: Informed written consent was obtained from the patient after a discussion of the risks (including hemorrhage and infection,  among others), benefits and alternatives to treatment. A timeout was performed prior to the initiation of the procedure. Initial ultrasound scanning demonstrates a large amount of ascites within the right lower abdominal quadrant. The right lower abdomen was prepped and draped  in the usual sterile fashion. 1% lidocaine was used for local anesthesia. An ultrasound image was saved for documentation purposes. Following this, a Yueh catheter was introduced. The paracentesis was performed. The catheter was removed and a dressing was applied. The patient tolerated the procedure well without immediate post procedural complication. FINDINGS: A total of approximately 4 L of straw-colored fluid was removed. Samples were sent to the laboratory as requested by the clinical team. IMPRESSION: Successful ultrasound-guided paracentesis yielding 4 liters of peritoneal fluid. Electronically Signed   By: Van Clines M.D.   On: 12/21/2021 14:09   DG Chest Port 1 View  Result Date: 12/15/2021 CLINICAL DATA:  Fever and lower extremity swelling. EXAM: PORTABLE CHEST 1 VIEW COMPARISON:  November 10, 2021 FINDINGS: Low lung volumes are seen. Mild to moderate severity areas of atelectasis and/or infiltrate are seen within the bilateral lung bases. There is a small left pleural effusion. No pneumothorax is identified. The heart size and mediastinal contours are within normal limits. Degenerative changes are noted throughout the thoracic spine. IMPRESSION: 1. Low lung volumes with mild to moderate severity bibasilar atelectasis and/or infiltrate. 2. Small left pleural effusion. Electronically Signed   By: Virgina Norfolk M.D.   On: 12/15/2021 22:47   ECHOCARDIOGRAM COMPLETE  Result Date: 12/16/2021    ECHOCARDIOGRAM REPORT   Patient Name:   Kristie Brooks Date of Exam: 12/16/2021 Medical Rec #:  [id number]           Height:       65.0 in Accession #:    [id number]          Weight:       290.0 lb Date of Birth:  1966-05-15           BSA:          2.317 m Patient Age:    55 years            BP:           73/36 mmHg Patient Gender: F                   HR:           106 bpm. Exam Location:  [facility] Procedure: 2D Echo, Cardiac Doppler and Color Doppler Indications:    Edema  History:         Patient has no prior history of Echocardiogram examinations.                 Signs/Symptoms:Edema.  Sonographer:    Wenda Low Referring Phys: [id number] ASIA B [place]  Sonographer Comments: Patient is morbidly obese and no subcostal window. IMPRESSIONS  1. Left ventricular ejection fraction, by estimation, is 65 to 70%. The left ventricle has normal function. The left ventricle has no regional wall motion abnormalities. The left ventricular internal cavity size was mildly dilated. There is mild left ventricular hypertrophy. Left ventricular diastolic parameters were normal.  2. Right ventricular systolic function is normal. The right ventricular size is normal. There is normal pulmonary artery systolic pressure.  3. The mitral valve is normal in structure. Trivial mitral valve regurgitation.  4. The aortic valve is tricuspid. Aortic valve regurgitation is not visualized. Aortic valve sclerosis is present, with no  evidence of aortic valve stenosis. FINDINGS  Left Ventricle: Left ventricular ejection fraction, by estimation, is 65 to 70%. The left ventricle has normal function. The left ventricle has no regional wall motion abnormalities. The left ventricular internal cavity size was mildly dilated. There is  mild left ventricular hypertrophy. Left ventricular diastolic parameters were normal. Right Ventricle: The right ventricular size is normal. Right vetricular wall thickness was not assessed. Right ventricular systolic function is normal. There is normal pulmonary artery systolic pressure. The tricuspid regurgitant velocity is 2.48 m/s, and with an assumed right atrial pressure of 8 mmHg, the estimated right ventricular systolic pressure is 81.1 mmHg. Left Atrium: Left atrial size was normal in size. Right Atrium: Right atrial size was normal in size. Pericardium: There is no evidence of pericardial effusion. Mitral Valve: The mitral valve is normal in structure. Trivial mitral valve regurgitation. MV  peak gradient, 7.2 mmHg. The mean mitral valve gradient is 4.0 mmHg. Tricuspid Valve: The tricuspid valve is normal in structure. Tricuspid valve regurgitation is trivial. Aortic Valve: The aortic valve is tricuspid. Aortic valve regurgitation is not visualized. Aortic valve sclerosis is present, with no evidence of aortic valve stenosis. Aortic valve mean gradient measures 8.0 mmHg. Aortic valve peak gradient measures 16.6 mmHg. Aortic valve area, by VTI measures 2.25 cm. Pulmonic Valve: The pulmonic valve was normal in structure. Pulmonic valve regurgitation is not visualized. Aorta: The aortic root is normal in size and structure. IAS/Shunts: No atrial level shunt detected by color flow Doppler.  LEFT VENTRICLE PLAX 2D LVIDd:         5.20 cm     Diastology LVIDs:         3.20 cm     LV e' medial:    13.40 cm/s LV PW:         1.10 cm     LV E/e' medial:  8.0 LV IVS:        1.00 cm     LV e' lateral:   16.80 cm/s LVOT diam:     2.00 cm     LV E/e' lateral: 6.4 LV SV:         100 LV SV Index:   43 LVOT Area:     3.14 cm  LV Volumes (MOD) LV vol d, MOD A2C: 75.0 ml LV vol d, MOD A4C: 69.1 ml LV vol s, MOD A2C: 32.3 ml LV vol s, MOD A4C: 27.0 ml LV SV MOD A2C:     42.7 ml LV SV MOD A4C:     69.1 ml LV SV MOD BP:      42.2 ml RIGHT VENTRICLE RV Basal diam:  3.30 cm RV Mid diam:    4.00 cm RV S prime:     19.20 cm/s TAPSE (M-mode): 3.1 cm LEFT ATRIUM             Index        RIGHT ATRIUM           Index LA diam:        4.50 cm 1.94 cm/m   RA Area:     17.50 cm LA Vol (A2C):   48.1 ml 20.76 ml/m  RA Volume:   41.20 ml  17.79 ml/m LA Vol (A4C):   92.3 ml 39.84 ml/m LA Biplane Vol: 71.3 ml 30.78 ml/m  AORTIC VALVE                     PULMONIC VALVE AV Area (Vmax):  2.39 cm      PV Vmax:       1.21 m/s AV Area (Vmean):   2.65 cm      PV Peak grad:  5.9 mmHg AV Area (VTI):     2.25 cm AV Vmax:           204.00 cm/s AV Vmean:          128.000 cm/s AV VTI:            0.444 m AV Peak Grad:      16.6 mmHg AV Mean  Grad:      8.0 mmHg LVOT Vmax:         155.00 cm/s LVOT Vmean:        108.000 cm/s LVOT VTI:          0.318 m LVOT/AV VTI ratio: 0.72  AORTA Ao Root diam: 2.80 cm MITRAL VALVE                TRICUSPID VALVE MV Area (PHT): 3.13 cm     TR Peak grad:   24.6 mmHg MV Area VTI:   3.86 cm     TR Vmax:        248.00 cm/s MV Peak grad:  7.2 mmHg MV Mean grad:  4.0 mmHg     SHUNTS MV Vmax:       1.34 m/s     Systemic VTI:  0.32 m MV Vmean:      88.2 cm/s    Systemic Diam: 2.00 cm MV Decel Time: 242 msec MV E velocity: 107.00 cm/s MV A velocity: 89.40 cm/s MV E/A ratio:  1.20 Dorris Carnes MD Electronically signed by Dorris Carnes MD Signature Date/Time: 12/16/2021/3:38:01 PM    Final    US LIVER DOPPLER  Result Date: 12/21/2021 CLINICAL DATA:  55 year old female with cirrhosis EXAM: DUPLEX ULTRASOUND OF LIVER TECHNIQUE: Color and duplex Doppler ultrasound was performed to evaluate the hepatic in-flow and out-flow vessels. COMPARISON:  None. FINDINGS: Portal Vein Velocities Main:  58 cm/sec Right:  57 cm/sec Left: Not visualized Hepatic Vein Velocities Right:  34 cm/sec Middle: Not visualized Left:  60 cm/sec Hepatic Artery Velocity:  78 cm/sec Splenic Vein Velocity:  59 cm/sec Varices: Present Ascites: Present Recanalized umbilical vein. Cirrhotic morphology of the liver IMPRESSION: Cirrhotic morphology of the liver with stigmata of portal hypertension including recanalized umbilical vein, additional varices, and ascites Signed, Dulcy Fanny. Dellia Nims, RPVI [facility] Electronically Signed   By: Corrie Mckusick D.O.   On: 12/21/2021 12:23   CT EXTREM LOWER W CM BIL  Result Date: 12/16/2021 CLINICAL DATA:  Soft tissue infection of the lower extremities bilaterally EXAM: CT OF THE LOWER BILATERAL EXTREMITY WITH CONTRAST TECHNIQUE: Multidetector CT imaging of the lower bilateral extremity was performed with axial images obtained from the acetabular roofs through the  proximal tibial metadiaphysis following intravenous contrast administration. COMPARISON:  None. CONTRAST:  150mL OMNIPAQUE IOHEXOL 300 MG/ML  SOLN FINDINGS: Bones/Joint/Cartilage Normal alignment. No acute fracture or dislocation. No osseous erosion or abnormal periosteal reaction. 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 Ligaments Suboptimally assessed by CT. Muscles and Tendons Unremarkable Soft tissues There is marked diffuse subcutaneous edema involving the lower extremities bilaterally circumferentially. Edematous changes extend into the inter fascial planes of the anterior, posterior, and medial muscular compartments of the thighs bilaterally. No discrete drainable fluid collection identified. Small bilateral knee effusions are present. Extensive subcutaneous edema is noted  within the visualized pannus bilaterally. Large volume ascites is present within the visualized pelvis. IMPRESSION: Extensive subcutaneous edema involving the lower extremities bilaterally and pannus. Infiltrative changes extend into the muscular compartments of the thighs bilaterally. No discrete drainable fluid collection is identified, however. Small bilateral knee effusions. Large volume ascites. Electronically Signed   By: Fidela Salisbury M.D.   On: 12/16/2021 02:12   [place] EKG SITE RITE  Result Date: 12/16/2021 If Site Rite image not attached, placement could not be confirmed due to current cardiac rhythm. [2 weeks]   Assessment:  55 y/o female with lymphedema, venous insufficiency, h/o bilateral ulcers requiring debridement in 10/2021 (cultures positive for Staph aureus, Pseudomonas aeruginosa, Ecoli), cellulitis involving both lower extremities, cirrhosis felt to be secondary to prior Hepatitis C (previously eradicated), presenting with acute decompensation in setting of sepsis due to lower extremity cellulitis, anasarca/ascites, encephalopathy, with GI consulted due to bloody emesis.    Cirrhosis/anasarca/ascites:  CT imaging with evidence of large volume ascites within the pelvis. She has portal hypertension with paraesophageal varices and massive varix extending from the umbilicus to the left iliac common vein noted on imaging. Portal vein patent on doppler. No prior EGD. Hypotension has improved since admission, her torsemide was resumed yesterday. She is also completing albumin runs today. Weight is down total of 18 pounds since 12/17/21 with no significant change in last 24 hours. Ascitic fluid with no signs of SBP thus far. Cytology pending. MELD Na 14. INR creeping upward.   Of note, labs from 02/2021 by her primary hepatologist, Dr. Comer Locket showed positive ANA, weakly positive ASMA.  Hematemesis: Had episode on 12/25. Hgb on admission on 12/23 was 8.8. On 12/25, Hgb 9.8. 12/26, Hgb 10.4. 12/27 Hgb 10.9. She did not receive any prbcs. Today, Hgb down to 8.3, which is not far off from when she presented on 12/23. No further overt GI bleeding in days. No vomiting. No melena, brbpr. Tolerating diet. Patient has declined EGD (she has known paraesophageal varices seen on CT 10/2021 at [facility]). No prior EGD.   Encephalopathy: resolved. Titrate lactulose dose to 3 soft BMs daily.    Plan: Follow up pending ascitic fluid.  Vitamin K IV 10mg .  PPI BID. Recheck MELD labs tomorrow, hopefully will see improved INR. 2 gram sodium diet. EGD when she is agreeable, can be done as outpatient by her primary GI, Dr. Comer Locket. She has not had prior EGD to screen for varices, based on imaging she likely has esophageal varices. Could consider non-selective beta blocker if BPs improve. Currently MAP remains below acceptable threshold for initiating, goal of MAP >82.   Titrate lactulose to 3 soft stools daily.   Laureen Ochs. Bernarda Caffey [facility] [phone] 12/29/202212:53 PM    LOS: 7 days

## 2021-12-23 NOTE — Progress Notes (Signed)
Palliative: [name], Kristie Brooks, is lying quietly in bed.  She greets me, making and mostly keeping eye contact.  She appears acutely/chronically ill and somewhat frail.  She is alert and oriented x3, able to make her basic needs known.  There is no family at bedside at this time.  We talked about paracentesis and results indicating no SBP.  We talked about likelihood of continued need for taps.  She states understanding.  We talked about antibiotics for cellulitis.  We talked about discharging to short-term rehab at [facility].  We talked about outpatient palliative services.  Continuing support and goals of care discussion.  At this point she tells me that she would like outpatient palliative services, but she is unsure of which provider.  We talked about local providers and I encouraged her to review their websites.  Conference with attending, bedside nursing staff, transition of care team related to patient condition, needs, goals of care, disposition.  Plan: Full scope/full code, would except trach/PEG.  Return to the [facility] for short-term rehab.  Considering outpatient palliative services.  Request transport via EMS with bariatric stretcher.  25 minutes Quinn Axe, NP Palliative medicine team Team phone [phone] Greater than 50% of this time was spent counseling and coordinating care related to the above assessment and plan.

## 2021-12-23 NOTE — Progress Notes (Signed)
Physical Therapy Treatment Patient Details Name: Kristie Brooks MRN: [id number] DOB: 24-Dec-1966 Today's Date: 12/23/2021   History of Present Illness Kristie Brooks  is a 55 y.o. female, with history of cirrhosis, presents ED for chief complaint of what she says is fluid overload.  What was reported by EMS states that she was sent in for fever and confusion.  Does report that her husband said she was confused that she has been thinks that she was.  She reports that she had fluid overload in her legs and thighs for 3 or 4 days.  She reports they have been weeping for 3 or 4 days.  She is also had erythema for 3 or 4 days.  She reports that all the symptoms started the same time.    PT Comments    Patient demonstrates slow labored movement for sitting up at bedside requiring HOB full raised and Min/mod assist to help move RLE and pulling self to sitting.  Patient has to rock trunk for completing sit to stands, unsteady on feet, limited to a few side steps at bedside before having to sit due to c/o fatigue and generalized weakness.  Patient SpO2 dropped from 93% to 81% while on room air and put on 2 LPM to maintain over 90% - nurse notified.  Patient tolerated sitting up in chair after therapy.  Patient will benefit from continued skilled physical therapy in hospital and recommended venue below to increase strength, balance, endurance for safe ADLs and gait.      Recommendations for follow up therapy are one component of a multi-disciplinary discharge planning process, led by the attending physician.  Recommendations may be updated based on patient status, additional functional criteria and insurance authorization.  Follow Up Recommendations  Skilled nursing-short term rehab (<3 hours/day)     Assistance Recommended at Discharge Intermittent Supervision/Assistance  Equipment Recommendations  None recommended by PT    Recommendations for Other Services       Precautions / Restrictions  Precautions Precautions: Fall Restrictions Weight Bearing Restrictions: No     Mobility  Bed Mobility Overal bed mobility: Needs Assistance Bed Mobility: Supine to Sit     Supine to sit: HOB elevated;Mod assist Sit to supine: Mod assist   General bed mobility comments: slow labored movement with assistance to move RLE and required [facility] raised    Transfers Overall transfer level: Needs assistance Equipment used: Rolling walker (2 wheels) Transfers: Sit to/from Stand Sit to Stand: Min assist     Step pivot transfers: Mod assist     General transfer comment: required rocking movement of trunk to complete sit to stands, slow labored movement    Ambulation/Gait Ambulation/Gait assistance: Min assist;Mod assist Gait Distance (Feet): 5 Feet Assistive device: Rolling walker (2 wheels) Gait Pattern/deviations: Decreased step length - left;Decreased step length - right;Decreased stride length;Trunk flexed Gait velocity: decreased     General Gait Details: limited to a few slow labored unsteady side steps due to fatigue and c/o generalized weakness   Stairs             Wheelchair Mobility    Modified Rankin (Stroke Patients Only)       Balance Overall balance assessment: Needs assistance Sitting-balance support: Feet supported;No upper extremity supported Sitting balance-Leahy Scale: Good Sitting balance - Comments: seated EOB   Standing balance support: Reliant on assistive device for balance;Bilateral upper extremity supported;During functional activity Standing balance-Leahy Scale: Fair Standing balance comment: using RW  Cognition Arousal/Alertness: Awake/alert Behavior During Therapy: WFL for tasks assessed/performed Overall Cognitive Status: Within Functional Limits for tasks assessed                                          Exercises General Exercises - Lower Extremity Long Arc Quad:  Seated;AROM;Strengthening;Both;10 reps Hip Flexion/Marching: Seated;AROM;Strengthening;Both;10 reps Toe Raises: Seated;AROM;Strengthening;Both;10 reps Heel Raises: Seated;AROM;Strengthening;Both;10 reps    General Comments        Pertinent Vitals/Pain Pain Assessment: Faces Faces Pain Scale: Hurts little more Pain Location: BLE with pressure and low back when sitting Pain Descriptors / Indicators: Sore;Grimacing;Guarding Pain Intervention(s): Limited activity within patient's tolerance;Monitored during session;Repositioned    Home Living                          Prior Function            PT Goals (current goals can now be found in the care plan section) Acute Rehab PT Goals Patient Stated Goal: Return home when able PT Goal Formulation: With patient Time For Goal Achievement: 12/27/21 Potential to Achieve Goals: Good Progress towards PT goals: Progressing toward goals    Frequency    Min 3X/week      PT Plan Current plan remains appropriate    Co-evaluation              AM-PAC PT "6 Clicks" Mobility   Outcome Measure  Help needed turning from your back to your side while in a flat bed without using bedrails?: A Little Help needed moving from lying on your back to sitting on the side of a flat bed without using bedrails?: A Lot Help needed moving to and from a bed to a chair (including a wheelchair)?: A Lot Help needed standing up from a chair using your arms (e.g., wheelchair or bedside chair)?: A Lot Help needed to walk in hospital room?: A Lot Help needed climbing 3-5 steps with a railing? : A Lot 6 Click Score: 13    End of Session Equipment Utilized During Treatment: Oxygen Activity Tolerance: Patient tolerated treatment well;Patient limited by fatigue Patient left: in chair;with call bell/phone within reach Nurse Communication: Mobility status PT Visit Diagnosis: Unsteadiness on feet (R26.81);Muscle weakness (generalized)  (M62.81);Difficulty in walking, not elsewhere classified (R26.2)     Time: 5956-3875 PT Time Calculation (min) (ACUTE ONLY): 30 min  Charges:  $Therapeutic Exercise: 8-22 mins $Therapeutic Activity: 8-22 mins                     12:34 PM, 12/23/21 Lonell Grandchild, MPT Physical Therapist with [facility] 336 [phone] office [phone] mobile phone

## 2021-12-23 NOTE — Progress Notes (Signed)
Occupational Therapy Treatment Patient Details Name: Kristie Brooks MRN: [id number] DOB: [birth year] Today's Date: 12/23/2021   History of present illness Kristie Brooks  is a 55 y.o. female, with history of cirrhosis, presents ED for chief complaint of what she says is fluid overload.  What was reported by EMS states that she was sent in for fever and confusion.  Does report that her husband said she was confused that she has been thinks that she was.  She reports that she had fluid overload in her legs and thighs for 3 or 4 days.  She reports they have been weeping for 3 or 4 days.  She is also had erythema for 3 or 4 days.  She reports that all the symptoms started the same time.   OT comments  Pt agreeable to OT treatment. Attempt to try session with supplemental O2 but pt desaturated to ~86% SpO2. Pt placed back on 1 L supplemental O2. Pt demonstrates very slow labored movement with extended rest breaks. Pt required mod A for bed mobility primarily to move B LE but single hand held assist also needed with HOB raised. Pt able to stand with min G to min A and bed raised per pt request. Pt able to walk forward and backward from EOB using RW. Pt left in bed with call bell within reach. Pt will benefit from continued OT in the hospital and recommended venue below to increase strength, balance, and endurance for safe ADL's.      Recommendations for follow up therapy are one component of a multi-disciplinary discharge planning process, led by the attending physician.  Recommendations may be updated based on patient status, additional functional criteria and insurance authorization.    Follow Up Recommendations  Skilled nursing-short term rehab (<3 hours/day)    Assistance Recommended at Discharge Frequent or constant Supervision/Assistance  Equipment Recommendations  None recommended by OT    Recommendations for Other Services      Precautions / Restrictions Precautions Precautions:  Fall Restrictions Weight Bearing Restrictions: No       Mobility Bed Mobility Overal bed mobility: Needs Assistance Bed Mobility: Supine to Sit;Sit to Supine     Supine to sit: HOB elevated;Mod assist Sit to supine: Mod assist   General bed mobility comments: Pt needed assist to move B LE during mobility. Labored movement.    Transfers Overall transfer level: Needs assistance Equipment used: Rolling walker (2 wheels) Transfers: Sit to/from Stand Sit to Stand: Min assist;From elevated surface;Min guard           General transfer comment: Pt able to complete sit to stand with Min G to min A with bed elvated per pt request. Pt able to ambulate forward and backward at EOB. More difficult with lateral steps to L side but able to do so.     Balance Overall balance assessment: Needs assistance Sitting-balance support: No upper extremity supported;Feet supported Sitting balance-Leahy Scale: Good Sitting balance - Comments: seated EOB   Standing balance support: During functional activity;Reliant on assistive device for balance;Bilateral upper extremity supported Standing balance-Leahy Scale: Fair Standing balance comment: using RW                           ADL either performed or assessed with clinical judgement                       Cognition Arousal/Alertness: Awake/alert Behavior During Therapy: [facility] for tasks assessed/performed  Overall Cognitive Status: Within Functional Limits for tasks assessed                                                              Pertinent Vitals/ Pain       Pain Assessment: Faces Faces Pain Scale: Hurts little more Pain Location: Groin are when attempting to move purewick. Pain Descriptors / Indicators: Guarding;Moaning Pain Intervention(s): Limited activity within patient's tolerance;Monitored during session                                                           Frequency  Min 1X/week        Progress Toward Goals  OT Goals(current goals can now be found in the care plan section)  Progress towards OT goals: Progressing toward goals  ADL Goals Pt Will Perform Grooming: with supervision;standing Pt Will Perform Upper Body Dressing: with modified independence;sitting Pt Will Transfer to Toilet: with supervision;ambulating;regular height toilet Pt Will Perform Toileting - Clothing Manipulation and hygiene: with modified independence;sitting/lateral leans;sit to/from stand Pt/caregiver will Perform Home Exercise Program: Increased strength;Both right and left upper extremity;Independently;With written HEP provided  Plan Discharge plan remains appropriate                                    End of Session Equipment Utilized During Treatment: Rolling walker (2 wheels);Oxygen  OT Visit Diagnosis: Muscle weakness (generalized) (M62.81)   Activity Tolerance Patient tolerated treatment well   Patient Left in bed;with call bell/phone within reach   Nurse Communication          Time: 5003-7048 OT Time Calculation (min): 25 min  Charges: OT General Charges $OT Visit: 1 Visit OT Treatments $Self Care/Home Management : 23-37 mins  Jeanette Rauth OT, MOT  Larey Seat 12/23/2021, 10:31 AM

## 2021-12-24 ENCOUNTER — Encounter (HOSPITAL_COMMUNITY)
Admission: EM | Disposition: A | Discharge status: Skilled Nursing Facility | Payer: Self-pay | Source: Skilled Nursing Facility | Attending: Internal Medicine

## 2021-12-24 ENCOUNTER — Inpatient Hospital Stay (HOSPITAL_COMMUNITY): Payer: Managed Care, Other (non HMO) | Admitting: Anesthesiology

## 2021-12-24 ENCOUNTER — Encounter (HOSPITAL_COMMUNITY): Payer: Self-pay | Admitting: Family Medicine

## 2021-12-24 DIAGNOSIS — K449 Diaphragmatic hernia without obstruction or gangrene: Secondary | ICD-10-CM

## 2021-12-24 DIAGNOSIS — K2289 Other specified disease of esophagus: Secondary | ICD-10-CM

## 2021-12-24 DIAGNOSIS — K209 Esophagitis, unspecified without bleeding: Secondary | ICD-10-CM

## 2021-12-24 DIAGNOSIS — K920 Hematemesis: Secondary | ICD-10-CM

## 2021-12-24 DIAGNOSIS — K3189 Other diseases of stomach and duodenum: Secondary | ICD-10-CM

## 2021-12-24 DIAGNOSIS — K766 Portal hypertension: Secondary | ICD-10-CM

## 2021-12-24 HISTORY — PX: ESOPHAGOGASTRODUODENOSCOPY (EGD) WITH PROPOFOL: SHX5813

## 2021-12-24 LAB — COMPREHENSIVE METABOLIC PANEL WITH GFR
ALT: 10 U/L (ref 0–44)
AST: 22 U/L (ref 15–41)
Albumin: 3.6 g/dL (ref 3.5–5.0)
Alkaline Phosphatase: 51 U/L (ref 38–126)
Anion gap: 9 (ref 5–15)
BUN: 10 mg/dL (ref 6–20)
CO2: 24 mmol/L (ref 22–32)
Calcium: 8.4 mg/dL — ABNORMAL LOW (ref 8.9–10.3)
Chloride: 106 mmol/L (ref 98–111)
Creatinine, Ser: 0.73 mg/dL (ref 0.44–1.00)
GFR, Estimated: >60 mL/min
Glucose, Bld: 83 mg/dL (ref 70–99)
Potassium: 3 mmol/L — ABNORMAL LOW (ref 3.5–5.1)
Sodium: 139 mmol/L (ref 135–145)
Total Bilirubin: 2.1 mg/dL — ABNORMAL HIGH (ref 0.3–1.2)
Total Protein: 5 g/dL — ABNORMAL LOW (ref 6.5–8.1)

## 2021-12-24 LAB — CBC
HCT: 26.4 % — ABNORMAL LOW (ref 36.0–46.0)
Hemoglobin: 8.5 g/dL — ABNORMAL LOW (ref 12.0–15.0)
MCH: 30.1 pg (ref 26.0–34.0)
MCHC: 32.2 g/dL (ref 30.0–36.0)
MCV: 93.6 fL (ref 80.0–100.0)
Platelets: 77 10*3/uL — ABNORMAL LOW (ref 150–400)
RBC: 2.82 MIL/uL — ABNORMAL LOW (ref 3.87–5.11)
RDW: 18.4 % — ABNORMAL HIGH (ref 11.5–15.5)
WBC: 3.5 10*3/uL — ABNORMAL LOW (ref 4.0–10.5)
nRBC: 0 % (ref 0.0–0.2)

## 2021-12-24 LAB — PROTIME-INR
INR: 1.6 — ABNORMAL HIGH (ref 0.8–1.2)
Prothrombin Time: 19.3 s — ABNORMAL HIGH (ref 11.4–15.2)

## 2021-12-24 SURGERY — ESOPHAGOGASTRODUODENOSCOPY (EGD) WITH PROPOFOL
Anesthesia: General

## 2021-12-24 MED ORDER — SULFAMETHOXAZOLE-TRIMETHOPRIM 800-160 MG PO TABS
1.0000 | ORAL_TABLET | Freq: Two times a day (BID) | ORAL | Status: DC
  Administered 2021-12-24 – 2021-12-26 (×5): 1 via ORAL
  Filled 2021-12-24 (×5): qty 1

## 2021-12-24 MED ORDER — POTASSIUM CHLORIDE 10 MEQ/100ML IV SOLN
10.0000 meq | INTRAVENOUS | Status: AC
  Administered 2021-12-24: 13:00:00 10 meq via INTRAVENOUS
  Filled 2021-12-24: qty 100

## 2021-12-24 MED ORDER — LACTULOSE 10 GM/15ML PO SOLN: 10.0000 g | Freq: Every day | ORAL | 0 refills | Status: DC

## 2021-12-24 MED ORDER — KETAMINE HCL 10 MG/ML IJ SOLN
INTRAMUSCULAR | Status: DC | PRN
  Administered 2021-12-24: 20 mg via INTRAVENOUS

## 2021-12-24 MED ORDER — PANTOPRAZOLE SODIUM 40 MG PO TBEC: 40.0000 mg | DELAYED_RELEASE_TABLET | Freq: Two times a day (BID) | ORAL | Status: DC

## 2021-12-24 MED ORDER — PROPOFOL 10 MG/ML IV BOLUS
INTRAVENOUS | Status: DC | PRN
  Administered 2021-12-24 (×3): 20 mg via INTRAVENOUS

## 2021-12-24 MED ORDER — LACTATED RINGERS IV SOLN: INTRAVENOUS | Status: DC

## 2021-12-24 MED ORDER — OXYCODONE HCL 5 MG PO TABS
5.0000 mg | ORAL_TABLET | Freq: Once | ORAL | Status: AC
  Administered 2021-12-24: 12:00:00 5 mg via ORAL

## 2021-12-24 MED ORDER — POTASSIUM CHLORIDE CRYS ER 20 MEQ PO TBCR
20.0000 meq | EXTENDED_RELEASE_TABLET | Freq: Once | ORAL | Status: AC
  Administered 2021-12-24: 13:00:00 20 meq via ORAL
  Filled 2021-12-24: qty 1

## 2021-12-24 MED ORDER — SULFAMETHOXAZOLE-TRIMETHOPRIM 800-160 MG PO TABS: 1.0000 | ORAL_TABLET | Freq: Two times a day (BID) | ORAL | 0 refills | Status: DC

## 2021-12-24 MED ORDER — PROPOFOL 10 MG/ML IV BOLUS
INTRAVENOUS | Status: AC
  Filled 2021-12-24: qty 20

## 2021-12-24 MED ORDER — SODIUM CHLORIDE 0.9 % IV SOLN: INTRAVENOUS | Status: DC

## 2021-12-24 MED ORDER — KETAMINE HCL 50 MG/5ML IJ SOSY
PREFILLED_SYRINGE | INTRAMUSCULAR | Status: AC
  Filled 2021-12-24: qty 5

## 2021-12-24 MED ORDER — LEVOTHYROXINE SODIUM 50 MCG PO TABS: 50.0000 ug | ORAL_TABLET | Freq: Every day | ORAL | 1 refills | Status: DC

## 2021-12-24 MED ORDER — MIDODRINE HCL 10 MG PO TABS
10.0000 mg | ORAL_TABLET | Freq: Three times a day (TID) | ORAL | Status: DC
Start: 2021-12-24 — End: 2022-05-09

## 2021-12-24 NOTE — Anesthesia Postprocedure Evaluation (Signed)
Anesthesia Post Note  Patient: Kristie Brooks  Procedure(s) Performed: ESOPHAGOGASTRODUODENOSCOPY (EGD) WITH PROPOFOL  Patient location during evaluation: Phase II Anesthesia Type: General Level of consciousness: awake Pain management: pain level controlled Vital Signs Assessment: post-procedure vital signs reviewed and stable Respiratory status: spontaneous breathing and respiratory function stable Cardiovascular status: blood pressure returned to baseline and stable Postop Assessment: no headache and no apparent nausea or vomiting Anesthetic complications: no Comments: Late entry   No notable events documented.   Last Vitals:  Vitals:   12/24/21 1136 12/24/21 1500  BP:  (!) 107/54  Pulse: 80 78  Resp: (!) 25 18  Temp:  36.8 C  SpO2: 91% 92%    Last Pain:  Vitals:   12/24/21 1500  TempSrc: Oral  PainSc:                  Louann Sjogren

## 2021-12-24 NOTE — Discharge Summary (Signed)
Physician Discharge Summary  Kristie Brooks NIO:[id number] DOB: 28-Jan-1966 DOA: 12/15/2021  PCP: Keane Police, MD  Admit date: 12/15/2021 Discharge date: 12/24/2021  Admitted From: SNF Disposition: SNF  Recommendations for Outpatient Follow-up:  Follow up with PCP in 1-2 weeks Please obtain BMP/CBC in one week    Discharge Condition: Stable CODE STATUS: FULL Diet recommendation: Low sodium   Brief/Interim Summary: As per H&P written by Dr. Clearence Ped on 12/16/21  Kristie Brooks  is a 55 y.o. female, with history of cirrhosis, presents ED for chief complaint of what she says is fluid overload.  What was reported by EMS states that she was sent in for fever and confusion.  Does report that her husband said she was confused that she has been thinks that she was.  She reports that she had fluid overload in her legs and thighs for 3 or 4 days.  She reports they have been weeping for 3 or 4 days.  She is also had erythema for 3 or 4 days.  She reports that all the symptoms started the same time.  She had fevers but she is unsure how high.  She denies any confusion.  She denies any cough, dysuria, chest pain, and vomiting.  She does report nausea intermittently since September.  She denies any diarrhea.  Patient reports no change in her normal abdominal pain.  She has had abdominal pain for a long time per her report.  Patient reports no purulent drainage from her bilateral legs.  Bilateral legs are wrapped by the ED at the time of my exam.  She does not have a history of an echo on file with our system.  Patient has no further complaints at this time.   Patient does not smoke, does not drink, does not use illicit drugs.  Patient is vaccinated for COVID.  Patient is full code.  Patient used to be a Marine scientist.   In the ED Temp 99.2, heart rate 100-118, respiratory 17-27, blood pressure at admission 84/34 but as low as systolic in the 81W Troponin 6, 7 Elevated white blood cell count  15.9, hemoglobin 11.3, platelets 126 Chemistry panel is unremarkable Albumin 1.9 Negative respiratory panel Blood culture pending CT pelvis shows marked diffuse subcu body wall edema with large volume ascites.  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 Together these findings are most in keeping with changes of portal venous hypertension Chest x-ray shows low lung volumes with mild to moderate severity bibasilar atelectasis and/or infiltrate with small left pleural effusion Patient was given cefepime, vancomycin however 3 L bolus Cintron was discussed with patient and patient declined.  She reports she still is 1 to be full code, but does not want a central line at this time.  Options for albumin discussed with patient.  Patient accepts albumin. Admission requested for possible sepsis secondary cellulitis.  She was started on vancomycin with resolution of her sepsis physiology.  She initially refused endoscopy on numerous occasions.  Ultimately she agreed and underwent EGD on 12/30  Discharge Diagnoses:   severe sepsis secondary to cellulitis -Bilateral lower extremity cellulitis appreciated -Patient met criteria at time of admission with tachycardia, elevated respiratory rate, elevated white blood cell count, low blood pressure, lactic acid 2.3 and also on presentation confusion as part of organ dysfunction. -Currently afebrile, mentation has significantly improved and back to baseline. -initially started on IV vanco -blood cultures neg -Ongoing lower extremity discomfort still present; overall erythematous changes and edema continue to  improve slowly. -Continue to follow recommendations by wound care service. -change vanc>>doxy -she stated doxy caused her to be nauseous>>switch to bactrim DS x 3 more days post discharge -sepsis physiology resolved   acute metabolic encephalopathy -In the setting of sepsis -Treatment as mentioned above -Ammonia level within  normal limit -Minimize the use of medication that can alter sedation. -Continue providing constant reorientation -Patient mentation back to baseline.   Decompensated Liver cirrhosis with ascites -Continue low-sodium diet, daily weights and strict I's and O's.   -restarted torsemide -Patient has received albumin infusion with some improvement in blood pressure. -GI recommending paracentesis for fluid analysis (if not too much fluid to be taken). -12/27 paracentesis-->4L removed, only 62 WBC (doubt SBP)>>AFB smear neg, cult neg so far -vitamin K given 12/29 -12/29 EGD--no varices; +portal hypertensive gastropathy/duodenopathy;  Erosions noted at distal esophagus proximal to GE junction   Venous stasis dermatitis lower extremity edema -Continue dressing care and wound care recommendations. -Cut to fit calcium alginate and place over wounds, top with ABD pad. Secure with kerlix and use 4" ACE wraps from toes to knees. Change daily.  Elevate legs as much as possible   hypothyroidism -TSH elevated -Synthroid dose has been started; patient initially refusing medication unrelated and acceptance to take. -Plan is to repeat thyroid panel in 4-6 weeks with further adjustment to her Synthroid regimen at that time.   class III obesity -Portion control and low calorie diet recommended. -Body mass index is 40.94 kg/m.   hypotension -Low blood pressure running at baseline in the setting of cirrhosis: Acute infection contributing and playing a role in her presentation. -Blood pressure overall improved and off pressors but remains soft -Continue adjusted dose of midodrine 3 times a day.    constipation -Continue the use of lactulose. -now having 2-3 BMs per day   chronic respiratory failure with hypoxia -Patient reports using 3-4 L nasal cannula supplementation at least for the last couple of months prior to admission. -Currently 2 L in place with good O2 sat -Continue the use of IS and flutter  valve.   dark hematemesis -patient reported never had EGD in the past; after discussing with GI here, she refused EGD initially -concerning for gastritis and ? Varices given hx of cirrhosis -Hgb overall stable ~8 -not further episode of vomiting -tolerating diet; no further hematemesis;  only one episode -continue PPI bid -initially refused EGD, subsequently agreed 12/28 -12/29 EGD--no varices; +portal hypertensive gastropathy/duodenopathy; Erosions noted at distal esophagus proximal to GE junction   Hypokalemia -replete -give dailly KCl    Discharge Instructions   Allergies as of 12/24/2021       Reactions   Dakin's [sodium Hypochlorite] Dermatitis   Zosyn [piperacillin Sod-tazobactam So] Other (See Comments)   Blood count dropped        Medication List     TAKE these medications    acetaminophen 325 MG tablet Commonly known as: TYLENOL Take 650 mg by mouth every 6 (six) hours as needed for moderate pain.   calcium carbonate 500 MG chewable tablet Commonly known as: TUMS - dosed in mg elemental calcium Chew 1 tablet by mouth daily.   clobetasol ointment 0.05 % Commonly known as: TEMOVATE Apply 1 application topically 2 (two) times daily.   fexofenadine 180 MG tablet Commonly known as: ALLEGRA Take by mouth.   fish oil-omega-3 fatty acids 1000 MG capsule Take 1 g by mouth daily.   folic acid 1 MG tablet Commonly known as: FOLVITE Take 1 mg  by mouth daily.   lactulose 10 GM/15ML solution Commonly known as: CHRONULAC Take 15 mLs (10 g total) by mouth daily.   levothyroxine 50 MCG tablet Commonly known as: SYNTHROID Take 1 tablet (50 mcg total) by mouth daily before breakfast. What changed:  medication strength how much to take   magnesium oxide 400 MG tablet Commonly known as: MAG-OX Take 400 mg by mouth daily.   midodrine 10 MG tablet Commonly known as: PROAMATINE Take 1 tablet (10 mg total) by mouth 3 (three) times daily with meals.    oxyCODONE 5 MG immediate release tablet Commonly known as: Oxy IR/ROXICODONE Take 5-10 mg by mouth every 4 (four) hours as needed for severe pain.   pantoprazole 40 MG tablet Commonly known as: PROTONIX Take 1 tablet (40 mg total) by mouth 2 (two) times daily before a meal.   potassium chloride 10 MEQ tablet Commonly known as: KLOR-CON M Take 10 mEq by mouth daily.   sulfamethoxazole-trimethoprim 800-160 MG tablet Commonly known as: BACTRIM DS Take 1 tablet by mouth every 12 (twelve) hours. X 3 days   thiamine 250 MG tablet Take 250 mg by mouth daily.   torsemide 20 MG tablet Commonly known as: DEMADEX Take 20 mg by mouth daily.   vitamin B-12 250 MCG tablet Commonly known as: CYANOCOBALAMIN Take 250 mcg by mouth daily.   vitamin C 1000 MG tablet Take 1,000 mg by mouth daily.   Vitamin D (Ergocalciferol) 1.25 MG (50000 UNIT) Caps capsule Commonly known as: DRISDOL Take 50,000 Units by mouth every 7 (seven) days. Sundays   vitamin E 200 UNIT capsule Take 400 Units by mouth daily.        Allergies  Allergen Reactions   Dakin's [Sodium Hypochlorite] Dermatitis   Zosyn [Piperacillin Sod-Tazobactam So] Other (See Comments)    Blood count dropped    Consultations: GI   Procedures/Studies: CT PELVIS W CONTRAST  Result Date: 12/16/2021 CLINICAL DATA:  Soft tissue infection EXAM: CT PELVIS WITH CONTRAST TECHNIQUE: Multidetector CT imaging of the pelvis was performed using the standard protocol following the bolus administration of intravenous contrast. CONTRAST:  149m OMNIPAQUE IOHEXOL 300 MG/ML  SOLN COMPARISON:  None. FINDINGS: Urinary Tract:  No abnormality visualized. Bowel: Unremarkable visualized pelvic bowel loops. There is large volume ascites present within the pelvis. Vascular/Lymphatic: A massive varix is seen extending from the umbilicus to the left common iliac vein, likely representing a portosystemic collateral in the setting of portal venous  hypertension. No pathologic adenopathy within the abdomen and pelvis. Reproductive: Involuted fibroids noted within the uterus. The pelvic organs are otherwise unremarkable. Other: There is extensive diffuse subcutaneous body wall noted within the visualized abdominal wall and flanks bilaterally. Musculoskeletal: No acute bone abnormality. No lytic or blastic bone lesion. IMPRESSION: Marked diffuse subcutaneous body wall edema. Large volume ascites. Massive varix extending from the umbilicus the left common iliac vein. Together, the findings are most in keeping with changes of portal venous hypertension. Electronically Signed   By: AFidela SalisburyM.D.   On: 12/16/2021 02:16   [place]  Result Date: 12/21/2021 INDICATION: Ascites.  Hepatitis-C.  Cirrhosis. EXAM: ULTRASOUND GUIDED  PARACENTESIS MEDICATIONS: None. COMPLICATIONS: None immediate. PROCEDURE: Informed written consent was obtained from the patient after a discussion of the risks (including hemorrhage and infection, among others), benefits and alternatives to treatment. A timeout was performed prior to the initiation of the procedure. Initial ultrasound scanning demonstrates a large amount of ascites within the right lower abdominal quadrant. The right  lower abdomen was prepped and draped in the usual sterile fashion. 1% lidocaine was used for local anesthesia. An ultrasound image was saved for documentation purposes. Following this, a Yueh catheter was introduced. The paracentesis was performed. The catheter was removed and a dressing was applied. The patient tolerated the procedure well without immediate post procedural complication. FINDINGS: A total of approximately 4 L of straw-colored fluid was removed. Samples were sent to the laboratory as requested by the clinical team. IMPRESSION: Successful ultrasound-guided paracentesis yielding 4 liters of peritoneal fluid. Electronically Signed   By: Van Clines M.D.   On: 12/21/2021 14:09    DG Chest Port 1 View  Result Date: 12/15/2021 CLINICAL DATA:  Fever and lower extremity swelling. EXAM: PORTABLE CHEST 1 VIEW COMPARISON:  November 10, 2021 FINDINGS: Low lung volumes are seen. Mild to moderate severity areas of atelectasis and/or infiltrate are seen within the bilateral lung bases. There is a small left pleural effusion. No pneumothorax is identified. The heart size and mediastinal contours are within normal limits. Degenerative changes are noted throughout the thoracic spine. IMPRESSION: 1. Low lung volumes with mild to moderate severity bibasilar atelectasis and/or infiltrate. 2. Small left pleural effusion. Electronically Signed   By: Virgina Norfolk M.D.   On: 12/15/2021 22:47   ECHOCARDIOGRAM COMPLETE  Result Date: 12/16/2021    ECHOCARDIOGRAM REPORT   Patient Name:   Kristie Brooks Date of Exam: 12/16/2021 Medical Rec #:  [id number]           Height:       65.0 in Accession #:    [id number]          Weight:       290.0 lb Date of Birth:  Sep 18, 1966           BSA:          2.317 m Patient Age:    54 years            BP:           73/36 mmHg Patient Gender: F                   HR:           106 bpm. Exam Location:  [facility] Procedure: 2D Echo, Cardiac Doppler and Color Doppler Indications:    Edema  History:        Patient has no prior history of Echocardiogram examinations.                 Signs/Symptoms:Edema.  Sonographer:    Wenda Low Referring Phys: [id number] ASIA B [place]  Sonographer Comments: Patient is morbidly obese and no subcostal window. IMPRESSIONS  1. Left ventricular ejection fraction, by estimation, is 65 to 70%. The left ventricle has normal function. The left ventricle has no regional wall motion abnormalities. The left ventricular internal cavity size was mildly dilated. There is mild left ventricular hypertrophy. Left ventricular diastolic parameters were normal.  2. Right ventricular systolic function is normal. The right ventricular size is  normal. There is normal pulmonary artery systolic pressure.  3. The mitral valve is normal in structure. Trivial mitral valve regurgitation.  4. The aortic valve is tricuspid. Aortic valve regurgitation is not visualized. Aortic valve sclerosis is present, with no evidence of aortic valve stenosis. FINDINGS  Left Ventricle: Left ventricular ejection fraction, by estimation, is 65 to 70%. The left ventricle has normal function. The left ventricle has no regional wall motion abnormalities. The  left ventricular internal cavity size was mildly dilated. There is  mild left ventricular hypertrophy. Left ventricular diastolic parameters were normal. Right Ventricle: The right ventricular size is normal. Right vetricular wall thickness was not assessed. Right ventricular systolic function is normal. There is normal pulmonary artery systolic pressure. The tricuspid regurgitant velocity is 2.48 m/s, and with an assumed right atrial pressure of 8 mmHg, the estimated right ventricular systolic pressure is 59.5 mmHg. Left Atrium: Left atrial size was normal in size. Right Atrium: Right atrial size was normal in size. Pericardium: There is no evidence of pericardial effusion. Mitral Valve: The mitral valve is normal in structure. Trivial mitral valve regurgitation. MV peak gradient, 7.2 mmHg. The mean mitral valve gradient is 4.0 mmHg. Tricuspid Valve: The tricuspid valve is normal in structure. Tricuspid valve regurgitation is trivial. Aortic Valve: The aortic valve is tricuspid. Aortic valve regurgitation is not visualized. Aortic valve sclerosis is present, with no evidence of aortic valve stenosis. Aortic valve mean gradient measures 8.0 mmHg. Aortic valve peak gradient measures 16.6 mmHg. Aortic valve area, by VTI measures 2.25 cm. Pulmonic Valve: The pulmonic valve was normal in structure. Pulmonic valve regurgitation is not visualized. Aorta: The aortic root is normal in size and structure. IAS/Shunts: No atrial level  shunt detected by color flow Doppler.  LEFT VENTRICLE PLAX 2D LVIDd:         5.20 cm     Diastology LVIDs:         3.20 cm     LV e' medial:    13.40 cm/s LV PW:         1.10 cm     LV E/e' medial:  8.0 LV IVS:        1.00 cm     LV e' lateral:   16.80 cm/s LVOT diam:     2.00 cm     LV E/e' lateral: 6.4 LV SV:         100 LV SV Index:   43 LVOT Area:     3.14 cm  LV Volumes (MOD) LV vol d, MOD A2C: 75.0 ml LV vol d, MOD A4C: 69.1 ml LV vol s, MOD A2C: 32.3 ml LV vol s, MOD A4C: 27.0 ml LV SV MOD A2C:     42.7 ml LV SV MOD A4C:     69.1 ml LV SV MOD BP:      42.2 ml RIGHT VENTRICLE RV Basal diam:  3.30 cm RV Mid diam:    4.00 cm RV S prime:     19.20 cm/s TAPSE (M-mode): 3.1 cm LEFT ATRIUM             Index        RIGHT ATRIUM           Index LA diam:        4.50 cm 1.94 cm/m   RA Area:     17.50 cm LA Vol (A2C):   48.1 ml 20.76 ml/m  RA Volume:   41.20 ml  17.79 ml/m LA Vol (A4C):   92.3 ml 39.84 ml/m LA Biplane Vol: 71.3 ml 30.78 ml/m  AORTIC VALVE                     PULMONIC VALVE AV Area (Vmax):    2.39 cm      PV Vmax:       1.21 m/s AV Area (Vmean):   2.65 cm      PV Peak grad:  5.9 mmHg AV Area (VTI):     2.25 cm AV Vmax:           204.00 cm/s AV Vmean:          128.000 cm/s AV VTI:            0.444 m AV Peak Grad:      16.6 mmHg AV Mean Grad:      8.0 mmHg LVOT Vmax:         155.00 cm/s LVOT Vmean:        108.000 cm/s LVOT VTI:          0.318 m LVOT/AV VTI ratio: 0.72  AORTA Ao Root diam: 2.80 cm MITRAL VALVE                TRICUSPID VALVE MV Area (PHT): 3.13 cm     TR Peak grad:   24.6 mmHg MV Area VTI:   3.86 cm     TR Vmax:        248.00 cm/s MV Peak grad:  7.2 mmHg MV Mean grad:  4.0 mmHg     SHUNTS MV Vmax:       1.34 m/s     Systemic VTI:  0.32 m MV Vmean:      88.2 cm/s    Systemic Diam: 2.00 cm MV Decel Time: 242 msec MV E velocity: 107.00 cm/s MV A velocity: 89.40 cm/s MV E/A ratio:  1.20 Dorris Carnes MD Electronically signed by Dorris Carnes MD Signature Date/Time: 12/16/2021/3:38:01 PM     Final    US LIVER DOPPLER  Result Date: 12/21/2021 CLINICAL DATA:  55 year old female with cirrhosis EXAM: DUPLEX ULTRASOUND OF LIVER TECHNIQUE: Color and duplex Doppler ultrasound was performed to evaluate the hepatic in-flow and out-flow vessels. COMPARISON:  None. FINDINGS: Portal Vein Velocities Main:  58 cm/sec Right:  57 cm/sec Left: Not visualized Hepatic Vein Velocities Right:  34 cm/sec Middle: Not visualized Left:  60 cm/sec Hepatic Artery Velocity:  78 cm/sec Splenic Vein Velocity:  59 cm/sec Varices: Present Ascites: Present Recanalized umbilical vein. Cirrhotic morphology of the liver IMPRESSION: Cirrhotic morphology of the liver with stigmata of portal hypertension including recanalized umbilical vein, additional varices, and ascites Signed, Dulcy Fanny. Dellia Nims, RPVI [facility] Electronically Signed   By: Corrie Mckusick D.O.   On: 12/21/2021 12:23   CT EXTREM LOWER W CM BIL  Result Date: 12/16/2021 CLINICAL DATA:  Soft tissue infection of the lower extremities bilaterally EXAM: CT OF THE LOWER BILATERAL EXTREMITY WITH CONTRAST TECHNIQUE: Multidetector CT imaging of the lower bilateral extremity was performed with axial images obtained from the acetabular roofs through the proximal tibial metadiaphysis following intravenous contrast administration. COMPARISON:  None. CONTRAST:  154m OMNIPAQUE IOHEXOL 300 MG/ML  SOLN FINDINGS: Bones/Joint/Cartilage Normal alignment. No acute fracture or dislocation. No osseous erosion or abnormal periosteal reaction. 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 Ligaments Suboptimally assessed by CT. Muscles and Tendons Unremarkable Soft tissues There is marked diffuse subcutaneous edema involving the lower extremities bilaterally circumferentially. Edematous changes extend into the inter fascial planes of the anterior, posterior, and medial muscular compartments of  the thighs bilaterally. No discrete drainable fluid collection identified. Small bilateral knee effusions are present. Extensive subcutaneous edema is noted within the visualized pannus bilaterally. Large volume ascites is present within the visualized pelvis. IMPRESSION: Extensive subcutaneous edema involving the lower extremities bilaterally and pannus. Infiltrative changes extend into the muscular compartments of  the thighs bilaterally. No discrete drainable fluid collection is identified, however. Small bilateral knee effusions. Large volume ascites. Electronically Signed   By: Fidela Salisbury M.D.   On: 12/16/2021 02:12   [place] EKG SITE RITE  Result Date: 12/16/2021 If Site Rite image not attached, placement could not be confirmed due to current cardiac rhythm.       Discharge Exam: Vitals:   12/24/21 1130 12/24/21 1136  BP: (!) 124/59   Pulse: 79 80  Resp: (!) 25 (!) 25  Temp:    SpO2: 92% 91%   Vitals:   12/24/21 1034 12/24/21 1120 12/24/21 1130 12/24/21 1136  BP: (!) 120/56 123/60 (!) 124/59   Pulse: 80 84 79 80  Resp: (!) 22 (!) 22 (!) 25 (!) 25  Temp: 98.5 F (36.9 C) 98.6 F (37 C)    TempSrc: Oral     SpO2: 96% 90% 92% 91%  Weight: 112 kg     Height: _0  (1.651 m)       General: Pt is alert, awake, not in acute distress Cardiovascular: RRR, S1/S2 +, no rubs, no gallops Respiratory: bibasilar crackles.  No wheeze Abdominal: Soft, NT, ND, bowel sounds + Extremities: 1 + LE edema, no cyanosis   The results of significant diagnostics from this hospitalization (including imaging, microbiology, ancillary and laboratory) are listed below for reference.    Significant Diagnostic Studies: CT PELVIS W CONTRAST  Result Date: 12/16/2021 CLINICAL DATA:  Soft tissue infection EXAM: CT PELVIS WITH CONTRAST TECHNIQUE: Multidetector CT imaging of the pelvis was performed using the standard protocol following the bolus administration of intravenous contrast. CONTRAST:  131m  OMNIPAQUE IOHEXOL 300 MG/ML  SOLN COMPARISON:  None. FINDINGS: Urinary Tract:  No abnormality visualized. Bowel: Unremarkable visualized pelvic bowel loops. There is large volume ascites present within the pelvis. Vascular/Lymphatic: A massive varix is seen extending from the umbilicus to the left common iliac vein, likely representing a portosystemic collateral in the setting of portal venous hypertension. No pathologic adenopathy within the abdomen and pelvis. Reproductive: Involuted fibroids noted within the uterus. The pelvic organs are otherwise unremarkable. Other: There is extensive diffuse subcutaneous body wall noted within the visualized abdominal wall and flanks bilaterally. Musculoskeletal: No acute bone abnormality. No lytic or blastic bone lesion. IMPRESSION: Marked diffuse subcutaneous body wall edema. Large volume ascites. Massive varix extending from the umbilicus the left common iliac vein. Together, the findings are most in keeping with changes of portal venous hypertension. Electronically Signed   By: AFidela SalisburyM.D.   On: 12/16/2021 02:16   [place]acent[place]  Result Date: 12/21/2021 INDICATION: Ascites.  Hepatitis-C.  Cirrhosis. EXAM: ULTRASOUND GUIDED  PARACENTESIS MEDICATIONS: None. COMPLICATIONS: None immediate. PROCEDURE: Informed written consent was obtained from the patient after a discussion of the risks (including hemorrhage and infection, among others), benefits and alternatives to treatment. A timeout was performed prior to the initiation of the procedure. Initial ultrasound scanning demonstrates a large amount of ascites within the right lower abdominal quadrant. The right lower abdomen was prepped and draped in the usual sterile fashion. 1% lidocaine was used for local anesthesia. An ultrasound image was saved for documentation purposes. Following this, a Yueh catheter was introduced. The paracentesis was performed. The catheter was removed and a dressing was applied. The  patient tolerated the procedure well without immediate post procedural complication. FINDINGS: A total of approximately 4 L of straw-colored fluid was removed. Samples were sent to the laboratory as requested by the clinical team. IMPRESSION:  Successful ultrasound-guided paracentesis yielding 4 liters of peritoneal fluid. Electronically Signed   By: Van Clines M.D.   On: 12/21/2021 14:09   DG Chest Port 1 View  Result Date: 12/15/2021 CLINICAL DATA:  Fever and lower extremity swelling. EXAM: PORTABLE CHEST 1 VIEW COMPARISON:  November 10, 2021 FINDINGS: Low lung volumes are seen. Mild to moderate severity areas of atelectasis and/or infiltrate are seen within the bilateral lung bases. There is a small left pleural effusion. No pneumothorax is identified. The heart size and mediastinal contours are within normal limits. Degenerative changes are noted throughout the thoracic spine. IMPRESSION: 1. Low lung volumes with mild to moderate severity bibasilar atelectasis and/or infiltrate. 2. Small left pleural effusion. Electronically Signed   By: Virgina Norfolk M.D.   On: 12/15/2021 22:47   ECHOCARDIOGRAM COMPLETE  Result Date: 12/16/2021    ECHOCARDIOGRAM REPORT   Patient Name:   Kristie Brooks Date of Exam: 12/16/2021 Medical Rec #:  [id number]           Height:       65.0 in Accession #:    [id number]          Weight:       290.0 lb Date of Birth:  July 18, 1966           BSA:          2.317 m Patient Age:    27 years            BP:           73/36 mmHg Patient Gender: F                   HR:           106 bpm. Exam Location:  [facility] Procedure: 2D Echo, Cardiac Doppler and Color Doppler Indications:    Edema  History:        Patient has no prior history of Echocardiogram examinations.                 Signs/Symptoms:Edema.  Sonographer:    Wenda Low Referring Phys: [id number] ASIA B [place]  Sonographer Comments: Patient is morbidly obese and no subcostal window. IMPRESSIONS  1. Left  ventricular ejection fraction, by estimation, is 65 to 70%. The left ventricle has normal function. The left ventricle has no regional wall motion abnormalities. The left ventricular internal cavity size was mildly dilated. There is mild left ventricular hypertrophy. Left ventricular diastolic parameters were normal.  2. Right ventricular systolic function is normal. The right ventricular size is normal. There is normal pulmonary artery systolic pressure.  3. The mitral valve is normal in structure. Trivial mitral valve regurgitation.  4. The aortic valve is tricuspid. Aortic valve regurgitation is not visualized. Aortic valve sclerosis is present, with no evidence of aortic valve stenosis. FINDINGS  Left Ventricle: Left ventricular ejection fraction, by estimation, is 65 to 70%. The left ventricle has normal function. The left ventricle has no regional wall motion abnormalities. The left ventricular internal cavity size was mildly dilated. There is  mild left ventricular hypertrophy. Left ventricular diastolic parameters were normal. Right Ventricle: The right ventricular size is normal. Right vetricular wall thickness was not assessed. Right ventricular systolic function is normal. There is normal pulmonary artery systolic pressure. The tricuspid regurgitant velocity is 2.48 m/s, and with an assumed right atrial pressure of 8 mmHg, the estimated right ventricular systolic pressure is 78.9 mmHg. Left Atrium: Left atrial size was normal in  size. Right Atrium: Right atrial size was normal in size. Pericardium: There is no evidence of pericardial effusion. Mitral Valve: The mitral valve is normal in structure. Trivial mitral valve regurgitation. MV peak gradient, 7.2 mmHg. The mean mitral valve gradient is 4.0 mmHg. Tricuspid Valve: The tricuspid valve is normal in structure. Tricuspid valve regurgitation is trivial. Aortic Valve: The aortic valve is tricuspid. Aortic valve regurgitation is not visualized. Aortic  valve sclerosis is present, with no evidence of aortic valve stenosis. Aortic valve mean gradient measures 8.0 mmHg. Aortic valve peak gradient measures 16.6 mmHg. Aortic valve area, by VTI measures 2.25 cm. Pulmonic Valve: The pulmonic valve was normal in structure. Pulmonic valve regurgitation is not visualized. Aorta: The aortic root is normal in size and structure. IAS/Shunts: No atrial level shunt detected by color flow Doppler.  LEFT VENTRICLE PLAX 2D LVIDd:         5.20 cm     Diastology LVIDs:         3.20 cm     LV e' medial:    13.40 cm/s LV PW:         1.10 cm     LV E/e' medial:  8.0 LV IVS:        1.00 cm     LV e' lateral:   16.80 cm/s LVOT diam:     2.00 cm     LV E/e' lateral: 6.4 LV SV:         100 LV SV Index:   43 LVOT Area:     3.14 cm  LV Volumes (MOD) LV vol d, MOD A2C: 75.0 ml LV vol d, MOD A4C: 69.1 ml LV vol s, MOD A2C: 32.3 ml LV vol s, MOD A4C: 27.0 ml LV SV MOD A2C:     42.7 ml LV SV MOD A4C:     69.1 ml LV SV MOD BP:      42.2 ml RIGHT VENTRICLE RV Basal diam:  3.30 cm RV Mid diam:    4.00 cm RV S prime:     19.20 cm/s TAPSE (M-mode): 3.1 cm LEFT ATRIUM             Index        RIGHT ATRIUM           Index LA diam:        4.50 cm 1.94 cm/m   RA Area:     17.50 cm LA Vol (A2C):   48.1 ml 20.76 ml/m  RA Volume:   41.20 ml  17.79 ml/m LA Vol (A4C):   92.3 ml 39.84 ml/m LA Biplane Vol: 71.3 ml 30.78 ml/m  AORTIC VALVE                     PULMONIC VALVE AV Area (Vmax):    2.39 cm      PV Vmax:       1.21 m/s AV Area (Vmean):   2.65 cm      PV Peak grad:  5.9 mmHg AV Area (VTI):     2.25 cm AV Vmax:           204.00 cm/s AV Vmean:          128.000 cm/s AV VTI:            0.444 m AV Peak Grad:      16.6 mmHg AV Mean Grad:      8.0 mmHg LVOT Vmax:  155.00 cm/s LVOT Vmean:        108.000 cm/s LVOT VTI:          0.318 m LVOT/AV VTI ratio: 0.72  AORTA Ao Root diam: 2.80 cm MITRAL VALVE                TRICUSPID VALVE MV Area (PHT): 3.13 cm     TR Peak grad:   24.6 mmHg MV Area  VTI:   3.86 cm     TR Vmax:        248.00 cm/s MV Peak grad:  7.2 mmHg MV Mean grad:  4.0 mmHg     SHUNTS MV Vmax:       1.34 m/s     Systemic VTI:  0.32 m MV Vmean:      88.2 cm/s    Systemic Diam: 2.00 cm MV Decel Time: 242 msec MV E velocity: 107.00 cm/s MV A velocity: 89.40 cm/s MV E/A ratio:  1.20 Dorris Carnes MD Electronically signed by Dorris Carnes MD Signature Date/Time: 12/16/2021/3:38:01 PM    Final    US LIVER DOPPLER  Result Date: 12/21/2021 CLINICAL DATA:  55 year old female with cirrhosis EXAM: DUPLEX ULTRASOUND OF LIVER TECHNIQUE: Color and duplex Doppler ultrasound was performed to evaluate the hepatic in-flow and out-flow vessels. COMPARISON:  None. FINDINGS: Portal Vein Velocities Main:  58 cm/sec Right:  57 cm/sec Left: Not visualized Hepatic Vein Velocities Right:  34 cm/sec Middle: Not visualized Left:  60 cm/sec Hepatic Artery Velocity:  78 cm/sec Splenic Vein Velocity:  59 cm/sec Varices: Present Ascites: Present Recanalized umbilical vein. Cirrhotic morphology of the liver IMPRESSION: Cirrhotic morphology of the liver with stigmata of portal hypertension including recanalized umbilical vein, additional varices, and ascites Signed, Dulcy Fanny. Dellia Nims, RPVI [facility] Electronically Signed   By: Corrie Mckusick D.O.   On: 12/21/2021 12:23   CT EXTREM LOWER W CM BIL  Result Date: 12/16/2021 CLINICAL DATA:  Soft tissue infection of the lower extremities bilaterally EXAM: CT OF THE LOWER BILATERAL EXTREMITY WITH CONTRAST TECHNIQUE: Multidetector CT imaging of the lower bilateral extremity was performed with axial images obtained from the acetabular roofs through the proximal tibial metadiaphysis following intravenous contrast administration. COMPARISON:  None. CONTRAST:  166m OMNIPAQUE IOHEXOL 300 MG/ML  SOLN FINDINGS: Bones/Joint/Cartilage Normal alignment. No acute fracture or dislocation. No osseous erosion or abnormal  periosteal reaction. 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 Ligaments Suboptimally assessed by CT. Muscles and Tendons Unremarkable Soft tissues There is marked diffuse subcutaneous edema involving the lower extremities bilaterally circumferentially. Edematous changes extend into the inter fascial planes of the anterior, posterior, and medial muscular compartments of the thighs bilaterally. No discrete drainable fluid collection identified. Small bilateral knee effusions are present. Extensive subcutaneous edema is noted within the visualized pannus bilaterally. Large volume ascites is present within the visualized pelvis. IMPRESSION: Extensive subcutaneous edema involving the lower extremities bilaterally and pannus. Infiltrative changes extend into the muscular compartments of the thighs bilaterally. No discrete drainable fluid collection is identified, however. Small bilateral knee effusions. Large volume ascites. Electronically Signed   By: AFidela SalisburyM.D.   On: 12/16/2021 02:12   [place]KG SITE RITE  Result Date: 12/16/2021 If Site Rite image not attached, placement could not be confirmed due to current cardiac rhythm.   Microbiology: Recent Results (from the past 240 hour(s))  Resp Panel by RT-PCR (Flu A&B, Covid) Nasopharyngeal Swab     Status:  None   Collection Time: 12/15/21  9:33 PM   Specimen: Nasopharyngeal Swab; Nasopharyngeal(NP) swabs in vial transport medium  Result Value Ref Range Status   SARS Coronavirus 2 by RT PCR NEGATIVE NEGATIVE Final    Comment: (NOTE) SARS-CoV-2 target nucleic acids are NOT DETECTED.  The SARS-CoV-2 RNA is generally detectable in upper respiratory specimens during the acute phase of infection. The lowest concentration of SARS-CoV-2 viral copies this assay can detect is 138 copies/mL. A negative result does not preclude SARS-Cov-2 infection and should not be used as the sole basis for treatment or other patient  management decisions. A negative result may occur with  improper specimen collection/handling, submission of specimen other than nasopharyngeal swab, presence of viral mutation(s) within the areas targeted by this assay, and inadequate number of viral copies(<138 copies/mL). A negative result must be combined with clinical observations, patient history, and epidemiological information. The expected result is Negative.  Fact Sheet for Patients:  EntrepreneurPulse.com.au  Fact Sheet for Healthcare Providers:  IncredibleEmployment.be  This test is no t yet approved or cleared by the [place] FDA and  has been authorized for detection and/or diagnosis of SARS-CoV-2 by FDA under an Emergency Use Authorization (EUA). This EUA will remain  in effect (meaning this test can be used) for the duration of the COVID-19 declaration under Section 564(b)(1) of the Act, 21 U.S.C.section 360bbb-3(b)(1), unless the authorization is terminated  or revoked sooner.       Influenza A by PCR NEGATIVE NEGATIVE Final   Influenza B by PCR NEGATIVE NEGATIVE Final    Comment: (NOTE) The Xpert Xpress SARS-CoV-2/FLU/RSV plus assay is intended as an aid in the diagnosis of influenza from Nasopharyngeal swab specimens and should not be used as a sole basis for treatment. Nasal washings and aspirates are unacceptable for Xpert Xpress SARS-CoV-2/FLU/RSV testing.  Fact Sheet for Patients: EntrepreneurPulse.com.au  Fact Sheet for Healthcare Providers: IncredibleEmployment.be  This test is not yet approved or cleared by the [place] FDA and has been authorized for detection and/or diagnosis of SARS-CoV-2 by FDA under an Emergency Use Authorization (EUA). This EUA will remain in effect (meaning this test can be used) for the duration of the COVID-19 declaration under Section 564(b)(1) of the Act, 21 U.S.C. section 360bbb-3(b)(1),  unless the authorization is terminated or revoked.  Performed at [facility], [address] [place], [place] [address]   Blood Culture (routine x 2)     Status: None   Collection Time: 12/15/21 10:15 PM   Specimen: BLOOD RIGHT ARM  Result Value Ref Range Status   Specimen Description BLOOD RIGHT ARM  Final   Special Requests   Final    BOTTLES DRAWN AEROBIC AND ANAEROBIC Blood Culture results may not be optimal due to an inadequate volume of blood received in culture bottles   Culture   Final    NO GROWTH 5 DAYS Performed at [facility], [address] [place], [place] [address]    Report Status 12/20/2021 FINAL  Final  Blood Culture (routine x 2)     Status: None   Collection Time: 12/15/21 10:15 PM   Specimen: Right Antecubital; Blood  Result Value Ref Range Status   Specimen Description RIGHT ANTECUBITAL  Final   Special Requests   Final    BOTTLES DRAWN AEROBIC AND ANAEROBIC Blood Culture results may not be optimal due to an excessive volume of blood received in culture bottles   Culture   Final    NO GROWTH 5  DAYS Performed at [facility], [address], [place], [place] [address]    Report Status 12/20/2021 FINAL  Final  Urine Culture     Status: Abnormal   Collection Time: 12/16/21 12:59 PM   Specimen: Urine, Catheterized  Result Value Ref Range Status   Specimen Description   Final    URINE, CATHETERIZED Performed at [facility], [address], [place], [place] [address]    Special Requests   Final    NONE Performed at [facility], [address] [place], [place] [address]    Culture MULTIPLE SPECIES PRESENT, SUGGEST RECOLLECTION (A)  Final   Report Status 12/17/2021 FINAL  Final  MRSA Next Gen by PCR, Nasal     Status: Abnormal   Collection Time: 12/18/21  8:00 AM   Specimen: Nasal Mucosa; Nasal Swab  Result Value Ref Range Status   MRSA by PCR Next Gen DETECTED (A) NOT DETECTED Final    Comment: RESULT CALLED TO, READ BACK BY AND VERIFIED WITH: SMITH @  [id number] ON [id number] BY HENDERSON L (NOTE) The GeneXpert MRSA Assay (FDA approved for NASAL specimens only), is one component of a comprehensive MRSA colonization surveillance program. It is not intended to diagnose MRSA infection nor to guide or monitor treatment for MRSA infections. Test performance is not FDA approved in patients less than 8 years old. Performed at [facility], [address] [place], [place] [address]   Acid Fast Smear (AFB)     Status: None   Collection Time: 12/21/21 12:20 PM   Specimen: Peritoneal Cavity; Body Fluid  Result Value Ref Range Status   AFB Specimen Processing Concentration  Final   Acid Fast Smear Negative  Final    Comment: (NOTE) Performed At: [facility] [place], [place] [id number] Rush Farmer MD ZE:[id number]    Source (AFB) PERITONEAL  Final    Comment: Performed at [facility], [address] [place], [place] [address]  Gram stain     Status: None   Collection Time: 12/21/21 12:20 PM   Specimen: Peritoneal Washings  Result Value Ref Range Status   Specimen Description PERITONEAL  Final   Special Requests NONE  Final   Gram Stain   Final    NO ORGANISMS SEEN NO WBC SEEN CYTOSPIN SMEAR Performed at [facility], [address], [place], [place] [address]    Report Status 12/21/2021 FINAL  Final  Culture, body fluid w Gram Stain-bottle     Status: None (Preliminary result)   Collection Time: 12/21/21 12:20 PM   Specimen: Peritoneal Washings  Result Value Ref Range Status   Specimen Description PERITONEAL  Final   Special Requests   Final    BOTTLES DRAWN AEROBIC AND ANAEROBIC Blood Culture results may not be optimal due to an excessive volume of blood received in culture bottles   Culture   Final    NO GROWTH 3 DAYS Performed at [facility], [address], [place],  [address]    Report Status PENDING  Incomplete     Labs: Basic Metabolic Panel: Recent Labs  Lab 12/18/21 0505 12/19/21 0524  12/21/21 1021 12/22/21 0524 12/23/21 0532 12/24/21 0456  NA 136 136 135 137 138 139  K 3.7 3.3* 3.4* 3.4* 3.3* 3.0*  CL 104 103 103 106 106 106  CO2 _0 GLUCOSE 107* 138* 116* 75 75 83  BUN _1 CREATININE 0.86 0.65 0.61 0.56 0.71 0.73  CALCIUM 8.0* 7.9* 8.0* 8.2* 8.4* 8.4*  MG 2.0  --   --   --   --   --    Liver Function Tests: Recent Labs  Lab 12/19/21 0524 12/21/21 1021 12/22/21 0524 12/23/21 0532 12/24/21 0456  AST 28 38 _0 ALT _1 ALKPHOS 91 101 61 52 51  BILITOT 1.6* 1.9* 1.9* 1.6* 2.1*  PROT 4.5* 4.8* 4.6* 5.1* 5.0*  ALBUMIN 2.3* 2.3* 3.0* 3.9 3.6   No results for input(s): LIPASE, AMYLASE in the last 168 hours. No results for input(s): AMMONIA in the last 168 hours. CBC: Recent Labs  Lab 12/20/21 0424 12/21/21 1021 12/23/21 0532 12/23/21 0847 12/24/21 0456  WBC 8.0 9.1 2.9* 3.2* 3.5*  NEUTROABS  --  6.4  --   --   --   HGB 10.4* 10.9* 8.0* 8.3* 8.5*  HCT 31.8* 33.1* 24.7* 25.6* 26.4*  MCV 90.9 92.7 95.0 94.1 93.6  PLT 116* 124* 70* 78* 77*   Cardiac Enzymes: No results for input(s): CKTOTAL, CKMB, CKMBINDEX, TROPONINI in the last 168 hours. BNP: Invalid input(s): POCBNP CBG: No results for input(s): GLUCAP in the last 168 hours.  Time coordinating discharge:  36 minutes  Signed:  Orson Eva, DO [facility] Pager: [phone] 12/24/2021, 1:02 PM

## 2021-12-24 NOTE — Anesthesia Preprocedure Evaluation (Signed)
Anesthesia Evaluation  Patient identified by MRN, date of birth, ID band Patient awake    Reviewed: Allergy & Precautions, H&P , NPO status , Patient's Chart, lab work & pertinent test results, reviewed documented beta blocker date and time   Airway Mallampati: II  TM Distance: >3 FB Neck ROM: full    Dental no notable dental hx.    Pulmonary neg pulmonary ROS,    Pulmonary exam normal breath sounds clear to auscultation       Cardiovascular Exercise Tolerance: Good negative cardio ROS   Rhythm:regular Rate:Normal     Neuro/Psych negative neurological ROS  negative psych ROS   GI/Hepatic negative GI ROS, Neg liver ROS,   Endo/Other  Morbid obesity  Renal/GU negative Renal ROS  negative genitourinary   Musculoskeletal   Abdominal   Peds  Hematology negative hematology ROS (+)   Anesthesia Other Findings   Reproductive/Obstetrics negative OB ROS                             Anesthesia Physical Anesthesia Plan  ASA: 3 and emergent  Anesthesia Plan: General   Post-op Pain Management:    Induction:   PONV Risk Score and Plan: Propofol infusion  Airway Management Planned:   Additional Equipment:   Intra-op Plan:   Post-operative Plan:   Informed Consent: I have reviewed the patients History and Physical, chart, labs and discussed the procedure including the risks, benefits and alternatives for the proposed anesthesia with the patient or authorized representative who has indicated his/her understanding and acceptance.     Dental Advisory Given  Plan Discussed with: CRNA  Anesthesia Plan Comments:         Anesthesia Quick Evaluation

## 2021-12-24 NOTE — Progress Notes (Signed)
Brief EGD note.  Normal mucosa of the proximal and mid esophagus. Scarring noted in distal esophagus. Erosions noted at distal esophagus proximal to GE junction. No evidence of esophageal or gastric varices. Mild portal hypertensive gastropathy. Mild portal hypertensive duodenopathy.  Patient tolerated procedure well.

## 2021-12-24 NOTE — Op Note (Signed)
[facility] Patient Name: Kristie Brooks Procedure Date: 12/24/2021 10:48 AM MRN: [id number] Date of Birth: August 24, 1966 Attending MD: Hildred Laser , MD CSN: [id number] Age: 55 Admit Type: Outpatient Procedure:                Upper GI endoscopy Indications:              Hematemesis Providers:                Hildred Laser, MD, Lambert Mody, Aram Candela Referring MD:             Orson Eva, DO Medicines:                Propofol per Anesthesia Complications:            No immediate complications. Estimated Blood Loss:     Estimated blood loss: none. Procedure:                Pre-Anesthesia Assessment:                           - Prior to the procedure, a History and Physical                            was performed, and patient medications and                            allergies were reviewed. The patient's tolerance of                            previous anesthesia was also reviewed. The risks                            and benefits of the procedure and the sedation                            options and risks were discussed with the patient.                            All questions were answered, and informed consent                            was obtained. Prior Anticoagulants: The patient has                            taken no previous anticoagulant or antiplatelet                            agents except for aspirin. ASA Grade Assessment:                            III - A patient with severe systemic disease. After                            reviewing the risks and benefits, the patient was  deemed in satisfactory condition to undergo the                            procedure.                           After obtaining informed consent, the endoscope was                            passed under direct vision. Throughout the                            procedure, the patient's blood pressure, pulse, and                            oxygen saturations  were monitored continuously. The                            [id number]-[id number] ([id number]) scope was introduced through the                            mouth, and advanced to the second part of duodenum.                            The upper GI endoscopy was accomplished without                            difficulty. The patient tolerated the procedure                            well. Scope In: 11:04:38 AM Scope Out: 11:09:35 AM Total Procedure Duration: 0 hours 4 minutes 57 seconds  Findings:      The hypopharynx was normal.      The proximal esophagus and mid esophagus were normal.      A scar was found in the distal esophagus.      LA Grade A (one or more mucosal breaks less than 5 mm, not extending       between tops of 2 mucosal folds) esophagitis with no bleeding was found       34 to 35 cm from the incisors.      The Z-line was found 35 cm from the incisors.      A 2 cm hiatal hernia was present.      Mild portal hypertensive gastropathy was found in the gastric fundus and       in the gastric body.      The gastric antrum, prepyloric region of the stomach and pylorus were       normal.      Diffuse mildly congested mucosa without active bleeding and with no       stigmata of bleeding was found in the duodenal bulb and in the second       portion of the duodenum. Impression:               - Normal hypopharynx.                           -  Normal proximal esophagus and mid esophagus.                           - Scar in the distal esophagus.                           - LA Grade A esophagitis with no bleeding.                           - Z-line.                           - Z-line, 35 cm from the incisors.                           - 2 cm hiatal hernia.                           - Portal hypertensive gastropathy.                           - Normal antrum, prepyloric region of the stomach                            and pylorus.                           - Congested duodenal mucosa.                            - No specimens collected. Moderate Sedation:      Per Anesthesia Care Recommendation:           - Return patient to hospital ward for ongoing care.                           - Resume previous diet today.                           - Continue present medications. Procedure Code(s):        --- Professional ---                           [phone], Esophagogastroduodenoscopy, flexible,                            transoral; diagnostic, including collection of                            specimen(s) by brushing or washing, when performed                            (separate procedure) Diagnosis Code(s):        --- Professional ---                           K22.8, Other specified diseases of esophagus  K20.90, Esophagitis, unspecified without bleeding                           K44.9, Diaphragmatic hernia without obstruction or                            gangrene                           K76.6, Portal hypertension                           K31.89, Other diseases of stomach and duodenum                           K92.0, Hematemesis CPT copyright 2019 [employer]. All rights reserved. The codes documented in this report are preliminary and upon coder review may  be revised to meet current compliance requirements. Hildred Laser, MD Hildred Laser, MD 12/24/2021 11:33:06 AM This report has been signed electronically. Number of Addenda: 0

## 2021-12-24 NOTE — Transfer of Care (Signed)
Immediate Anesthesia Transfer of Care Note  Patient: Kristie Brooks  Procedure(s) Performed: ESOPHAGOGASTRODUODENOSCOPY (EGD) WITH PROPOFOL  Patient Location: PACU  Anesthesia Type:General  Level of Consciousness: awake, alert , oriented and patient cooperative  Airway & Oxygen Therapy: Patient Spontanous Breathing and Patient connected to face mask oxygen  Post-op Assessment: Report given to RN and Post -op Vital signs reviewed and stable  Post vital signs: Reviewed and stable  Last Vitals:  Vitals Value Taken Time  BP 123/60 12/24/21 1118  Temp    Pulse 84 12/24/21 1119  Resp 22 12/24/21 1119  SpO2 89 % 12/24/21 1119  Vitals shown include unvalidated device data.  Last Pain:  Vitals:   12/24/21 1057  TempSrc:   PainSc: 6       Patients Stated Pain Goal: 6 (84/78/41 2820)  Complications: No notable events documented.

## 2021-12-24 NOTE — Progress Notes (Signed)
Subjective:  Patient feels better.  She denies shortness of breath chest pain or abdominal pain.  She says her cellulitis is better.  She is not draining anymore.  Current Medications:  Current Facility-Administered Medications:    Chlorhexidine Gluconate Cloth 2 % PADS 6 each, 6 each, Topical, Daily, Barton Dubois, MD, 6 each at 12/23/21 1206   doxycycline (VIBRA-TABS) tablet 100 mg, 100 mg, Oral, Q12H, Tat, Shanon Brow, MD, 100 mg at 12/23/21 2103   lactulose (CHRONULAC) 10 GM/15ML solution 10 g, 10 g, Oral, Daily, Mahala Menghini, PA-C   levothyroxine (SYNTHROID) tablet 100 mcg, 100 mcg, Oral, Q0600, Barton Dubois, MD, 100 mcg at 12/23/21 7564   liver oil-zinc oxide (DESITIN) 40 % ointment, , Topical, Q8H PRN, Barton Dubois, MD   MEDLINE mouth rinse, 15 mL, Mouth Rinse, BID, Barton Dubois, MD, 15 mL at 12/23/21 0830   midodrine (PROAMATINE) tablet 10 mg, 10 mg, Oral, TID WC, Barton Dubois, MD, 10 mg at 12/23/21 1602   mupirocin ointment (BACTROBAN) 2 %, , Nasal, BID, Barton Dubois, MD, Given at 12/23/21 2103   ondansetron [facility]) tablet 4 mg, 4 mg, Oral, Q6H PRN, 4 mg at 12/22/21 1805 **OR** ondansetron (ZOFRAN) injection 4 mg, 4 mg, Intravenous, Q6H PRN, Barton Dubois, MD, 4 mg at 12/23/21 2102   oxyCODONE (Oxy IR/ROXICODONE) immediate release tablet 5 mg, 5 mg, Oral, Q4H PRN, Barton Dubois, MD, 5 mg at 12/22/21 1716   pantoprazole (PROTONIX) EC tablet 40 mg, 40 mg, Oral, BID AC, Tat, Shanon Brow, MD, 40 mg at 12/23/21 1602   potassium chloride 10 mEq in 100 mL IVPB, 10 mEq, Intravenous, Q1 Hr x 2, Carlan, Chelsea L, NP   sodium chloride flush (NS) 0.9 % injection 10-40 mL, 10-40 mL, Intracatheter, Q12H, Barton Dubois, MD, 10 mL at 12/23/21 2055   sodium chloride flush (NS) 0.9 % injection 10-40 mL, 10-40 mL, Intracatheter, PRN, Barton Dubois, MD   torsemide [name]) tablet 20 mg, 20 mg, Oral, Daily, Tat, David, MD, 20 mg at 12/23/21 0830   Objective: Blood pressure (!) 111/56, pulse  81, temperature 98.3 F (36.8 C), temperature source Oral, resp. rate 18, height 5' 5"  (1.651 m), weight 112 kg, SpO2 93 %. Patient is alert and in no acute distress. Conjunctiva is pale. Sclera is nonicteric Oropharyngeal mucosa is normal. No neck masses or thyromegaly noted. Cardiac exam with regular rhythm normal S1 and S2.  She has faint systolic murmur best at aortic area. Lungs are clear to auscultation. Abdomen is full but not tense.  Mild tenderness noted around the abdominal paracentesis site.  Both legs are wrapped.  She has some pitting edema to her feet and above the level of knees. No LE edema or clubbing noted.  Labs/studies Results:   CBC Latest Ref Rng & Units 12/24/2021 12/23/2021 12/23/2021  WBC 4.0 - 10.5 K/uL 3.5(L) 3.2(L) 2.9(L)  Hemoglobin 12.0 - 15.0 g/dL 8.5(L) 8.3(L) 8.0(L)  Hematocrit 36.0 - 46.0 % 26.4(L) 25.6(L) 24.7(L)  Platelets 150 - 400 K/uL 77(L) 78(L) 70(L)    CMP Latest Ref Rng & Units 12/24/2021 12/23/2021 12/22/2021  Glucose 70 - 99 mg/dL 83 75 75  BUN 6 - 20 mg/dL 10 11 11   Creatinine 0.44 - 1.00 mg/dL 0.73 0.71 0.56  Sodium 135 - 145 mmol/L 139 138 137  Potassium 3.5 - 5.1 mmol/L 3.0(L) 3.3(L) 3.4(L)  Chloride 98 - 111 mmol/L 106 106 106  CO2 22 - 32 mmol/L 24 23 25   Calcium 8.9 - 10.3 mg/dL 8.4(L) 8.4(L)  8.2(L)  Total Protein 6.5 - 8.1 g/dL 5.0(L) 5.1(L) 4.6(L)  Total Bilirubin 0.3 - 1.2 mg/dL 2.1(H) 1.6(H) 1.9(H)  Alkaline Phos 38 - 126 U/L 51 52 61  AST 15 - 41 U/L 22 24 28   ALT 0 - 44 U/L 10 10 11     Hepatic Function Latest Ref Rng & Units 12/24/2021 12/23/2021 12/22/2021  Total Protein 6.5 - 8.1 g/dL 5.0(L) 5.1(L) 4.6(L)  Albumin 3.5 - 5.0 g/dL 3.6 3.9 3.0(L)  AST 15 - 41 U/L 22 24 28   ALT 0 - 44 U/L 10 10 11   Alk Phosphatase 38 - 126 U/L 51 52 61  Total Bilirubin 0.3 - 1.2 mg/dL 2.1(H) 1.6(H) 1.9(H)     Assessment:  #1.  Upper GI bleed.  Patient experienced hematemesis 5 days ago.  EGD was recommended but she declined and now  she is ready.  She has not had any more episodes of hematemesis.  She also has not had any melena.  Hemoglobin has dropped from 1.20 arthrograms but has been stable over the last 24 hours. Differential diagnosis includes variceal versus nonvariceal bleed.  She is also high risk for peptic ulcer disease.  #2.  Decompensated cirrhosis.  He has coagulopathy, thrombocytopenia as well as encephalopathy and ascites.  She underwent LVAP 3 days ago.  Abdomen is not tense.  #3.  Bilateral lower extremity cellulitis with antibiotics she has been transitioned to doxycycline but complains of nausea.  She may have to be switched to another medication.  #4.  Hypokalemia.  We will give her KCl IV 10 mg/h x 2 doses.  Plan:  Proceed with esophagogastroduodenoscopy with esophageal variceal banding if this is a source of bleeding.  Patient is agreeable.  She will need monitored anesthesia care. While in the room I tried to contact her husband but he did not answer his phone.

## 2021-12-24 NOTE — TOC Transition Note (Signed)
Transition of Care [facility]) - CM/SW Discharge Note   Patient Details  Name: Kristie Brooks MRN: [id number] Date of Birth: 07/13/1966  Transition of Care [facility] CM/SW Contact:  Boneta Lucks, RN Phone Number: 12/24/2021, 3:20 PM   Clinical Narrative:   Patient discharging back to [facility]. Medical necessity printed. RN to call report. Clinicals sent in the hub. EMS scheduled, patient need a bariatric stretcher. EMS has no trucks this evening or the weekend. Husband and patient updated with delay of transportation.    Final next level of care: Skilled Nursing Facility Barriers to Discharge: Barriers Resolved   Patient Goals and CMS Choice Patient states their goals for this hospitalization and ongoing recovery are:: to return to rehab. CMS Medicare.gov Compare Post Acute Care list provided to:: Patient    Discharge Placement               Patient to be transferred to facility by: EMS Name of family member notified: Antony Haste Patient and family notified of of transfer: 12/24/21  Discharge Plan and Services                 Readmission Risk Interventions Readmission Risk Prevention Plan 12/24/2021 12/17/2021  Post Dischage Appt Complete -  Medication Screening Complete Complete  Transportation Screening Complete Complete  Some recent data might be hidden

## 2021-12-25 LAB — BASIC METABOLIC PANEL
Anion gap: 10 (ref 5–15)
BUN: 11 mg/dL (ref 6–20)
CO2: 23 mmol/L (ref 22–32)
Calcium: 7.9 mg/dL — ABNORMAL LOW (ref 8.9–10.3)
Chloride: 105 mmol/L (ref 98–111)
Creatinine, Ser: 0.83 mg/dL (ref 0.44–1.00)
GFR, Estimated: >60 mL/min (ref >60)
Glucose, Bld: 99 mg/dL (ref 70–99)
Potassium: 3.4 mmol/L — ABNORMAL LOW (ref 3.5–5.1)
Sodium: 138 mmol/L (ref 135–145)

## 2021-12-25 MED ORDER — POTASSIUM CHLORIDE CRYS ER 20 MEQ PO TBCR
20.0000 meq | EXTENDED_RELEASE_TABLET | Freq: Once | ORAL | Status: AC
  Administered 2021-12-25: 20 meq via ORAL
  Filled 2021-12-25: qty 1

## 2021-12-25 NOTE — Discharge Summary (Signed)
Physician Discharge Summary  Kristie Brooks XKG:[id number] DOB: [birth year] DOA: 12/15/2021  PCP: Keane Police, MD  Admit date: 12/15/2021 Discharge date: 12/25/2021  Admitted From: SNF Disposition:  SNF  Recommendations for Outpatient Follow-up:  Follow up with PCP in 1-2 weeks Please obtain BMP/CBC in one week    Discharge Condition: Stable CODE STATUS:FULL Diet recommendation: Low Sodium   Brief/Interim Summary: 02/16/21  Kristie Brooks  is a 55 y.o. female, with history of cirrhosis, presents ED for chief complaint of what she says is fluid overload.  What was reported by EMS states that she was sent in for fever and confusion.  Does report that her husband said she was confused that she has been thinks that she was.  She reports that she had fluid overload in her legs and thighs for 3 or 4 days.  She reports they have been weeping for 3 or 4 days.  She is also had erythema for 3 or 4 days.  She reports that all the symptoms started the same time.  She had fevers but she is unsure how high.  She denies any confusion.  She denies any cough, dysuria, chest pain, and vomiting.  She does report nausea intermittently since September.  She denies any diarrhea.  Patient reports no change in her normal abdominal pain.  She has had abdominal pain for a long time per her report.  Patient reports no purulent drainage from her bilateral legs.  Bilateral legs are wrapped by the ED at the time of my exam.  She does not have a history of an echo on file with our system.  Patient has no further complaints at this time.   Patient does not smoke, does not drink, does not use illicit drugs.  Patient is vaccinated for COVID.  Patient is full code.  Patient used to be a Marine scientist.   In the ED Temp 99.2, heart rate 100-118, respiratory 17-27, blood pressure at admission 84/34 but as low as systolic in the 70Y Troponin 6, 7 Elevated white blood cell count 15.9, hemoglobin 11.3, platelets  126 Chemistry panel is unremarkable Albumin 1.9 Negative respiratory panel Blood culture pending CT pelvis shows marked diffuse subcu body wall edema with large volume ascites.  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 Together these findings are most in keeping with changes of portal venous hypertension Chest x-ray shows low lung volumes with mild to moderate severity bibasilar atelectasis and/or infiltrate with small left pleural effusion Patient was given cefepime, vancomycin however 3 L bolus Cintron was discussed with patient and patient declined.  She reports she still is 1 to be full code, but does not want a central line at this time.  Options for albumin discussed with patient.  Patient accepts albumin. Admission requested for possible sepsis secondary cellulitis.  She was started on vancomycin with resolution of her sepsis physiology.  She initially refused endoscopy on numerous occasions.  Ultimately she agreed and underwent EGD on 12/30 which revealed no varices; +portal hypertensive gastropathy/duodenopathy; Erosions noted at distal esophagus proximal to GE junction  Discharge Diagnoses:  severe sepsis secondary to cellulitis -Bilateral lower extremity cellulitis appreciated -Patient met criteria at time of admission with tachycardia, elevated respiratory rate, elevated white blood cell count, low blood pressure, lactic acid 2.3 and also on presentation confusion as part of organ dysfunction. -Currently afebrile, mentation has significantly improved and back to baseline. -initially started on IV vanco -blood cultures neg -Ongoing lower extremity discomfort  still present; overall erythematous changes and edema continue to improve slowly. -Continue to follow recommendations by wound care service. -change vanc>>doxy -she stated doxy caused her to be nauseous>>switch to bactrim DS x 3 more days post discharge -sepsis physiology resolved   acute metabolic  encephalopathy -In the setting of sepsis -Treatment as mentioned above -Ammonia level within normal limit -Minimize the use of medication that can alter sedation. -Continue providing constant reorientation -Patient mentation back to baseline.   Decompensated Liver cirrhosis with ascites -Continue low-sodium diet, daily weights and strict I's and O's.   -restarted torsemide -Patient has received albumin infusion with some improvement in blood pressure. -GI recommending paracentesis for fluid analysis (if not too much fluid to be taken). -12/27 paracentesis-->4L removed, only 62 WBC (doubt SBP)>>AFB smear neg, cult neg so far -vitamin K given 12/29 -12/29 EGD--no varices; +portal hypertensive gastropathy/duodenopathy;  Erosions noted at distal esophagus proximal to GE junction   Venous stasis dermatitis lower extremity edema -Continue dressing care and wound care recommendations. -Cut to fit calcium alginate and place over wounds, top with ABD pad. Secure with kerlix and use 4" ACE wraps from toes to knees. Change daily.  Elevate legs as much as possible   hypothyroidism -TSH elevated -Synthroid dose has been started; patient initially refusing medication unrelated and acceptance to take. -Plan is to repeat thyroid panel in 4-6 weeks with further adjustment to her Synthroid regimen at that time.   class III obesity -Portion control and low calorie diet recommended. -Body mass index is 40.94 kg/m.   hypotension -Low blood pressure running at baseline in the setting of cirrhosis: Acute infection contributing and playing a role in her presentation. -Blood pressure overall improved and off pressors but remains soft -Continue adjusted dose of midodrine 3 times a day.    constipation -Continue the use of lactulose. -now having 2-3 BMs per day   chronic respiratory failure with hypoxia -Patient reports using 3-4 L nasal cannula supplementation at least for the last couple of months  prior to admission. -Currently 2 L in place with good O2 sat -Continue the use of IS and flutter valve.   dark hematemesis -patient reported never had EGD in the past; after discussing with GI here, she refused EGD initially -concerning for gastritis and ? Varices given hx of cirrhosis -Hgb overall stable ~8 -not further episode of vomiting -tolerating diet; no further hematemesis;  only one episode -continue PPI bid -initially refused EGD, subsequently agreed 12/28 -12/30 EGD--no varices; +portal hypertensive gastropathy/duodenopathy; Erosions noted at distal esophagus proximal to GE junction   Hypokalemia -replete -give dailly KCl    Discharge Instructions   Allergies as of 12/25/2021       Reactions   Dakin's [sodium Hypochlorite] Dermatitis   Zosyn [piperacillin Sod-tazobactam So] Other (See Comments)   Blood count dropped        Medication List     TAKE these medications    acetaminophen 325 MG tablet Commonly known as: TYLENOL Take 650 mg by mouth every 6 (six) hours as needed for moderate pain.   calcium carbonate 500 MG chewable tablet Commonly known as: TUMS - dosed in mg elemental calcium Chew 1 tablet by mouth daily.   clobetasol ointment 0.05 % Commonly known as: TEMOVATE Apply 1 application topically 2 (two) times daily.   fexofenadine 180 MG tablet Commonly known as: ALLEGRA Take by mouth.   fish oil-omega-3 fatty acids 1000 MG capsule Take 1 g by mouth daily.   folic acid 1  MG tablet Commonly known as: FOLVITE Take 1 mg by mouth daily.   lactulose 10 GM/15ML solution Commonly known as: CHRONULAC Take 15 mLs (10 g total) by mouth daily.   levothyroxine 50 MCG tablet Commonly known as: SYNTHROID Take 1 tablet (50 mcg total) by mouth daily before breakfast. What changed:  medication strength how much to take   magnesium oxide 400 MG tablet Commonly known as: MAG-OX Take 400 mg by mouth daily.   midodrine 10 MG tablet Commonly  known as: PROAMATINE Take 1 tablet (10 mg total) by mouth 3 (three) times daily with meals.   oxyCODONE 5 MG immediate release tablet Commonly known as: Oxy IR/ROXICODONE Take 5-10 mg by mouth every 4 (four) hours as needed for severe pain.   pantoprazole 40 MG tablet Commonly known as: PROTONIX Take 1 tablet (40 mg total) by mouth 2 (two) times daily before a meal.   potassium chloride 10 MEQ tablet Commonly known as: KLOR-CON M Take 10 mEq by mouth daily.   sulfamethoxazole-trimethoprim 800-160 MG tablet Commonly known as: BACTRIM DS Take 1 tablet by mouth every 12 (twelve) hours. X 3 days   thiamine 250 MG tablet Take 250 mg by mouth daily.   torsemide 20 MG tablet Commonly known as: DEMADEX Take 20 mg by mouth daily.   vitamin B-12 250 MCG tablet Commonly known as: CYANOCOBALAMIN Take 250 mcg by mouth daily.   vitamin C 1000 MG tablet Take 1,000 mg by mouth daily.   Vitamin D (Ergocalciferol) 1.25 MG (50000 UNIT) Caps capsule Commonly known as: DRISDOL Take 50,000 Units by mouth every 7 (seven) days. Sundays   vitamin E 200 UNIT capsule Take 400 Units by mouth daily.        Allergies  Allergen Reactions   Dakin's [Sodium Hypochlorite] Dermatitis   Zosyn [Piperacillin Sod-Tazobactam So] Other (See Comments)    Blood count dropped    Consultations: GI   Procedures/Studies: CT PELVIS W CONTRAST  Result Date: 12/16/2021 CLINICAL DATA:  Soft tissue infection EXAM: CT PELVIS WITH CONTRAST TECHNIQUE: Multidetector CT imaging of the pelvis was performed using the standard protocol following the bolus administration of intravenous contrast. CONTRAST:  131m OMNIPAQUE IOHEXOL 300 MG/ML  SOLN COMPARISON:  None. FINDINGS: Urinary Tract:  No abnormality visualized. Bowel: Unremarkable visualized pelvic bowel loops. There is large volume ascites present within the pelvis. Vascular/Lymphatic: A massive varix is seen extending from the umbilicus to the left common iliac  vein, likely representing a portosystemic collateral in the setting of portal venous hypertension. No pathologic adenopathy within the abdomen and pelvis. Reproductive: Involuted fibroids noted within the uterus. The pelvic organs are otherwise unremarkable. Other: There is extensive diffuse subcutaneous body wall noted within the visualized abdominal wall and flanks bilaterally. Musculoskeletal: No acute bone abnormality. No lytic or blastic bone lesion. IMPRESSION: Marked diffuse subcutaneous body wall edema. Large volume ascites. Massive varix extending from the umbilicus the left common iliac vein. Together, the findings are most in keeping with changes of portal venous hypertension. Electronically Signed   By: AFidela SalisburyM.D.   On: 12/16/2021 02:16   [place]  Result Date: 12/21/2021 INDICATION: Ascites.  Hepatitis-C.  Cirrhosis. EXAM: ULTRASOUND GUIDED  PARACENTESIS MEDICATIONS: None. COMPLICATIONS: None immediate. PROCEDURE: Informed written consent was obtained from the patient after a discussion of the risks (including hemorrhage and infection, among others), benefits and alternatives to treatment. A timeout was performed prior to the initiation of the procedure. Initial ultrasound scanning demonstrates a large amount of  ascites within the right lower abdominal quadrant. The right lower abdomen was prepped and draped in the usual sterile fashion. 1% lidocaine was used for local anesthesia. An ultrasound image was saved for documentation purposes. Following this, a Yueh catheter was introduced. The paracentesis was performed. The catheter was removed and a dressing was applied. The patient tolerated the procedure well without immediate post procedural complication. FINDINGS: A total of approximately 4 L of straw-colored fluid was removed. Samples were sent to the laboratory as requested by the clinical team. IMPRESSION: Successful ultrasound-guided paracentesis yielding 4 liters of peritoneal  fluid. Electronically Signed   By: Van Clines M.D.   On: 12/21/2021 14:09   DG Chest Port 1 View  Result Date: 12/15/2021 CLINICAL DATA:  Fever and lower extremity swelling. EXAM: PORTABLE CHEST 1 VIEW COMPARISON:  November 10, 2021 FINDINGS: Low lung volumes are seen. Mild to moderate severity areas of atelectasis and/or infiltrate are seen within the bilateral lung bases. There is a small left pleural effusion. No pneumothorax is identified. The heart size and mediastinal contours are within normal limits. Degenerative changes are noted throughout the thoracic spine. IMPRESSION: 1. Low lung volumes with mild to moderate severity bibasilar atelectasis and/or infiltrate. 2. Small left pleural effusion. Electronically Signed   By: Virgina Norfolk M.D.   On: 12/15/2021 22:47   ECHOCARDIOGRAM COMPLETE  Result Date: 12/16/2021    ECHOCARDIOGRAM REPORT   Patient Name:   Kristie Brooks Date of Exam: 12/16/2021 Medical Rec #:  [id number]           Height:       65.0 in Accession #:    [id number]          Weight:       290.0 lb Date of Birth:  September 15, 1966           BSA:          2.317 m Patient Age:    62 years            BP:           73/36 mmHg Patient Gender: F                   HR:           106 bpm. Exam Location:  [facility] Procedure: 2D Echo, Cardiac Doppler and Color Doppler Indications:    Edema  History:        Patient has no prior history of Echocardiogram examinations.                 Signs/Symptoms:Edema.  Sonographer:    Wenda Low Referring Phys: [id number] ASIA B [place]  Sonographer Comments: Patient is morbidly obese and no subcostal window. IMPRESSIONS  1. Left ventricular ejection fraction, by estimation, is 65 to 70%. The left ventricle has normal function. The left ventricle has no regional wall motion abnormalities. The left ventricular internal cavity size was mildly dilated. There is mild left ventricular hypertrophy. Left ventricular diastolic parameters were normal.   2. Right ventricular systolic function is normal. The right ventricular size is normal. There is normal pulmonary artery systolic pressure.  3. The mitral valve is normal in structure. Trivial mitral valve regurgitation.  4. The aortic valve is tricuspid. Aortic valve regurgitation is not visualized. Aortic valve sclerosis is present, with no evidence of aortic valve stenosis. FINDINGS  Left Ventricle: Left ventricular ejection fraction, by estimation, is 65 to 70%. The left ventricle has normal function. The  left ventricle has no regional wall motion abnormalities. The left ventricular internal cavity size was mildly dilated. There is  mild left ventricular hypertrophy. Left ventricular diastolic parameters were normal. Right Ventricle: The right ventricular size is normal. Right vetricular wall thickness was not assessed. Right ventricular systolic function is normal. There is normal pulmonary artery systolic pressure. The tricuspid regurgitant velocity is 2.48 m/s, and with an assumed right atrial pressure of 8 mmHg, the estimated right ventricular systolic pressure is 29.4 mmHg. Left Atrium: Left atrial size was normal in size. Right Atrium: Right atrial size was normal in size. Pericardium: There is no evidence of pericardial effusion. Mitral Valve: The mitral valve is normal in structure. Trivial mitral valve regurgitation. MV peak gradient, 7.2 mmHg. The mean mitral valve gradient is 4.0 mmHg. Tricuspid Valve: The tricuspid valve is normal in structure. Tricuspid valve regurgitation is trivial. Aortic Valve: The aortic valve is tricuspid. Aortic valve regurgitation is not visualized. Aortic valve sclerosis is present, with no evidence of aortic valve stenosis. Aortic valve mean gradient measures 8.0 mmHg. Aortic valve peak gradient measures 16.6 mmHg. Aortic valve area, by VTI measures 2.25 cm. Pulmonic Valve: The pulmonic valve was normal in structure. Pulmonic valve regurgitation is not visualized. Aorta:  The aortic root is normal in size and structure. IAS/Shunts: No atrial level shunt detected by color flow Doppler.  LEFT VENTRICLE PLAX 2D LVIDd:         5.20 cm     Diastology LVIDs:         3.20 cm     LV e' medial:    13.40 cm/s LV PW:         1.10 cm     LV E/e' medial:  8.0 LV IVS:        1.00 cm     LV e' lateral:   16.80 cm/s LVOT diam:     2.00 cm     LV E/e' lateral: 6.4 LV SV:         100 LV SV Index:   43 LVOT Area:     3.14 cm  LV Volumes (MOD) LV vol d, MOD A2C: 75.0 ml LV vol d, MOD A4C: 69.1 ml LV vol s, MOD A2C: 32.3 ml LV vol s, MOD A4C: 27.0 ml LV SV MOD A2C:     42.7 ml LV SV MOD A4C:     69.1 ml LV SV MOD BP:      42.2 ml RIGHT VENTRICLE RV Basal diam:  3.30 cm RV Mid diam:    4.00 cm RV S prime:     19.20 cm/s TAPSE (M-mode): 3.1 cm LEFT ATRIUM             Index        RIGHT ATRIUM           Index LA diam:        4.50 cm 1.94 cm/m   RA Area:     17.50 cm LA Vol (A2C):   48.1 ml 20.76 ml/m  RA Volume:   41.20 ml  17.79 ml/m LA Vol (A4C):   92.3 ml 39.84 ml/m LA Biplane Vol: 71.3 ml 30.78 ml/m  AORTIC VALVE                     PULMONIC VALVE AV Area (Vmax):    2.39 cm      PV Vmax:       1.21 m/s AV Area (Vmean):   2.65  cm      PV Peak grad:  5.9 mmHg AV Area (VTI):     2.25 cm AV Vmax:           204.00 cm/s AV Vmean:          128.000 cm/s AV VTI:            0.444 m AV Peak Grad:      16.6 mmHg AV Mean Grad:      8.0 mmHg LVOT Vmax:         155.00 cm/s LVOT Vmean:        108.000 cm/s LVOT VTI:          0.318 m LVOT/AV VTI ratio: 0.72  AORTA Ao Root diam: 2.80 cm MITRAL VALVE                TRICUSPID VALVE MV Area (PHT): 3.13 cm     TR Peak grad:   24.6 mmHg MV Area VTI:   3.86 cm     TR Vmax:        248.00 cm/s MV Peak grad:  7.2 mmHg MV Mean grad:  4.0 mmHg     SHUNTS MV Vmax:       1.34 m/s     Systemic VTI:  0.32 m MV Vmean:      88.2 cm/s    Systemic Diam: 2.00 cm MV Decel Time: 242 msec MV E velocity: 107.00 cm/s MV A velocity: 89.40 cm/s MV E/A ratio:  1.20 Dorris Carnes MD  Electronically signed by Dorris Carnes MD Signature Date/Time: 12/16/2021/3:38:01 PM    Final    US LIVER DOPPLER  Result Date: 12/21/2021 CLINICAL DATA:  55 year old female with cirrhosis EXAM: DUPLEX ULTRASOUND OF LIVER TECHNIQUE: Color and duplex Doppler ultrasound was performed to evaluate the hepatic in-flow and out-flow vessels. COMPARISON:  None. FINDINGS: Portal Vein Velocities Main:  58 cm/sec Right:  57 cm/sec Left: Not visualized Hepatic Vein Velocities Right:  34 cm/sec Middle: Not visualized Left:  60 cm/sec Hepatic Artery Velocity:  78 cm/sec Splenic Vein Velocity:  59 cm/sec Varices: Present Ascites: Present Recanalized umbilical vein. Cirrhotic morphology of the liver IMPRESSION: Cirrhotic morphology of the liver with stigmata of portal hypertension including recanalized umbilical vein, additional varices, and ascites Signed, Dulcy Fanny. Dellia Nims, RPVI [facility] Radiology Electronically Signed   By: Corrie Mckusick D.O.   On: 12/21/2021 12:23   CT EXTREM LOWER W CM BIL  Result Date: 12/16/2021 CLINICAL DATA:  Soft tissue infection of the lower extremities bilaterally EXAM: CT OF THE LOWER BILATERAL EXTREMITY WITH CONTRAST TECHNIQUE: Multidetector CT imaging of the lower bilateral extremity was performed with axial images obtained from the acetabular roofs through the proximal tibial metadiaphysis following intravenous contrast administration. COMPARISON:  None. CONTRAST:  122m OMNIPAQUE IOHEXOL 300 MG/ML  SOLN FINDINGS: Bones/Joint/Cartilage Normal alignment. No acute fracture or dislocation. No osseous erosion or abnormal periosteal reaction. 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 Ligaments Suboptimally assessed by CT. Muscles and Tendons Unremarkable Soft tissues There is marked diffuse subcutaneous edema involving the lower extremities bilaterally circumferentially. Edematous changes extend into the  inter fascial planes of the anterior, posterior, and medial muscular compartments of the thighs bilaterally. No discrete drainable fluid collection identified. Small bilateral knee effusions are present. Extensive subcutaneous edema is noted within the visualized pannus bilaterally. Large volume ascites is present within the visualized pelvis. IMPRESSION: Extensive subcutaneous edema involving the lower extremities bilaterally  and pannus. Infiltrative changes extend into the muscular compartments of the thighs bilaterally. No discrete drainable fluid collection is identified, however. Small bilateral knee effusions. Large volume ascites. Electronically Signed   By: Fidela Salisbury M.D.   On: 12/16/2021 02:12   [place] EKG SITE RITE  Result Date: 12/16/2021 If Site Rite image not attached, placement could not be confirmed due to current cardiac rhythm.       Discharge Exam: Vitals:   12/24/21 2201 12/25/21 0600  BP: (!) 108/53 130/64  Pulse: 87 100  Resp: 20 20  Temp: 98.5 F (36.9 C) 100.3 F (37.9 C)  SpO2: 90% 93%   Vitals:   12/24/21 1136 12/24/21 1500 12/24/21 2201 12/25/21 0600  BP:  (!) 107/54 (!) 108/53 130/64  Pulse: 80 78 87 100  Resp: (!) _0 Temp:  98.3 F (36.8 C) 98.5 F (36.9 C) 100.3 F (37.9 C)  TempSrc:  Oral Oral   SpO2: 91% 92% 90% 93%  Weight:    109 kg  Height:        General: Pt is alert, awake, not in acute distress Cardiovascular: RRR, S1/S2 +, no rubs, no gallops Respiratory: bibasilar crackles. No wheeze Abdominal: Soft, NT, ND, bowel sounds + Extremities: 1 +LE edema, no cyanosis   The results of significant diagnostics from this hospitalization (including imaging, microbiology, ancillary and laboratory) are listed below for reference.    Significant Diagnostic Studies: CT PELVIS W CONTRAST  Result Date: 12/16/2021 CLINICAL DATA:  Soft tissue infection EXAM: CT PELVIS WITH CONTRAST TECHNIQUE: Multidetector CT imaging of the pelvis was  performed using the standard protocol following the bolus administration of intravenous contrast. CONTRAST:  13m OMNIPAQUE IOHEXOL 300 MG/ML  SOLN COMPARISON:  None. FINDINGS: Urinary Tract:  No abnormality visualized. Bowel: Unremarkable visualized pelvic bowel loops. There is large volume ascites present within the pelvis. Vascular/Lymphatic: A massive varix is seen extending from the umbilicus to the left common iliac vein, likely representing a portosystemic collateral in the setting of portal venous hypertension. No pathologic adenopathy within the abdomen and pelvis. Reproductive: Involuted fibroids noted within the uterus. The pelvic organs are otherwise unremarkable. Other: There is extensive diffuse subcutaneous body wall noted within the visualized abdominal wall and flanks bilaterally. Musculoskeletal: No acute bone abnormality. No lytic or blastic bone lesion. IMPRESSION: Marked diffuse subcutaneous body wall edema. Large volume ascites. Massive varix extending from the umbilicus the left common iliac vein. Together, the findings are most in keeping with changes of portal venous hypertension. Electronically Signed   By: AFidela SalisburyM.D.   On: 12/16/2021 02:16   [place]  Result Date: 12/21/2021 INDICATION: Ascites.  Hepatitis-C.  Cirrhosis. EXAM: ULTRASOUND GUIDED  PARACENTESIS MEDICATIONS: None. COMPLICATIONS: None immediate. PROCEDURE: Informed written consent was obtained from the patient after a discussion of the risks (including hemorrhage and infection, among others), benefits and alternatives to treatment. A timeout was performed prior to the initiation of the procedure. Initial ultrasound scanning demonstrates a large amount of ascites within the right lower abdominal quadrant. The right lower abdomen was prepped and draped in the usual sterile fashion. 1% lidocaine was used for local anesthesia. An ultrasound image was saved for documentation purposes. Following this, a Yueh  catheter was introduced. The paracentesis was performed. The catheter was removed and a dressing was applied. The patient tolerated the procedure well without immediate post procedural complication. FINDINGS: A total of approximately 4 L of straw-colored fluid was removed. Samples were sent to  the laboratory as requested by the clinical team. IMPRESSION: Successful ultrasound-guided paracentesis yielding 4 liters of peritoneal fluid. Electronically Signed   By: Van Clines M.D.   On: 12/21/2021 14:09   DG Chest Port 1 View  Result Date: 12/15/2021 CLINICAL DATA:  Fever and lower extremity swelling. EXAM: PORTABLE CHEST 1 VIEW COMPARISON:  November 10, 2021 FINDINGS: Low lung volumes are seen. Mild to moderate severity areas of atelectasis and/or infiltrate are seen within the bilateral lung bases. There is a small left pleural effusion. No pneumothorax is identified. The heart size and mediastinal contours are within normal limits. Degenerative changes are noted throughout the thoracic spine. IMPRESSION: 1. Low lung volumes with mild to moderate severity bibasilar atelectasis and/or infiltrate. 2. Small left pleural effusion. Electronically Signed   By: Virgina Norfolk M.D.   On: 12/15/2021 22:47   ECHOCARDIOGRAM COMPLETE  Result Date: 12/16/2021    ECHOCARDIOGRAM REPORT   Patient Name:   Kristie Brooks Date of Exam: 12/16/2021 Medical Rec #:  [id number]           Height:       65.0 in Accession #:    [id number]          Weight:       290.0 lb Date of Birth:  July 20, 1966           BSA:          2.317 m Patient Age:    1 years            BP:           73/36 mmHg Patient Gender: F                   HR:           106 bpm. Exam Location:  [facility] Procedure: 2D Echo, Cardiac Doppler and Color Doppler Indications:    Edema  History:        Patient has no prior history of Echocardiogram examinations.                 Signs/Symptoms:Edema.  Sonographer:    Wenda Low Referring Phys: [id number]  ASIA B [place]  Sonographer Comments: Patient is morbidly obese and no subcostal window. IMPRESSIONS  1. Left ventricular ejection fraction, by estimation, is 65 to 70%. The left ventricle has normal function. The left ventricle has no regional wall motion abnormalities. The left ventricular internal cavity size was mildly dilated. There is mild left ventricular hypertrophy. Left ventricular diastolic parameters were normal.  2. Right ventricular systolic function is normal. The right ventricular size is normal. There is normal pulmonary artery systolic pressure.  3. The mitral valve is normal in structure. Trivial mitral valve regurgitation.  4. The aortic valve is tricuspid. Aortic valve regurgitation is not visualized. Aortic valve sclerosis is present, with no evidence of aortic valve stenosis. FINDINGS  Left Ventricle: Left ventricular ejection fraction, by estimation, is 65 to 70%. The left ventricle has normal function. The left ventricle has no regional wall motion abnormalities. The left ventricular internal cavity size was mildly dilated. There is  mild left ventricular hypertrophy. Left ventricular diastolic parameters were normal. Right Ventricle: The right ventricular size is normal. Right vetricular wall thickness was not assessed. Right ventricular systolic function is normal. There is normal pulmonary artery systolic pressure. The tricuspid regurgitant velocity is 2.48 m/s, and with an assumed right atrial pressure of 8 mmHg, the estimated right ventricular systolic pressure is 32.6  mmHg. Left Atrium: Left atrial size was normal in size. Right Atrium: Right atrial size was normal in size. Pericardium: There is no evidence of pericardial effusion. Mitral Valve: The mitral valve is normal in structure. Trivial mitral valve regurgitation. MV peak gradient, 7.2 mmHg. The mean mitral valve gradient is 4.0 mmHg. Tricuspid Valve: The tricuspid valve is normal in structure. Tricuspid valve regurgitation  is trivial. Aortic Valve: The aortic valve is tricuspid. Aortic valve regurgitation is not visualized. Aortic valve sclerosis is present, with no evidence of aortic valve stenosis. Aortic valve mean gradient measures 8.0 mmHg. Aortic valve peak gradient measures 16.6 mmHg. Aortic valve area, by VTI measures 2.25 cm. Pulmonic Valve: The pulmonic valve was normal in structure. Pulmonic valve regurgitation is not visualized. Aorta: The aortic root is normal in size and structure. IAS/Shunts: No atrial level shunt detected by color flow Doppler.  LEFT VENTRICLE PLAX 2D LVIDd:         5.20 cm     Diastology LVIDs:         3.20 cm     LV e' medial:    13.40 cm/s LV PW:         1.10 cm     LV E/e' medial:  8.0 LV IVS:        1.00 cm     LV e' lateral:   16.80 cm/s LVOT diam:     2.00 cm     LV E/e' lateral: 6.4 LV SV:         100 LV SV Index:   43 LVOT Area:     3.14 cm  LV Volumes (MOD) LV vol d, MOD A2C: 75.0 ml LV vol d, MOD A4C: 69.1 ml LV vol s, MOD A2C: 32.3 ml LV vol s, MOD A4C: 27.0 ml LV SV MOD A2C:     42.7 ml LV SV MOD A4C:     69.1 ml LV SV MOD BP:      42.2 ml RIGHT VENTRICLE RV Basal diam:  3.30 cm RV Mid diam:    4.00 cm RV S prime:     19.20 cm/s TAPSE (M-mode): 3.1 cm LEFT ATRIUM             Index        RIGHT ATRIUM           Index LA diam:        4.50 cm 1.94 cm/m   RA Area:     17.50 cm LA Vol (A2C):   48.1 ml 20.76 ml/m  RA Volume:   41.20 ml  17.79 ml/m LA Vol (A4C):   92.3 ml 39.84 ml/m LA Biplane Vol: 71.3 ml 30.78 ml/m  AORTIC VALVE                     PULMONIC VALVE AV Area (Vmax):    2.39 cm      PV Vmax:       1.21 m/s AV Area (Vmean):   2.65 cm      PV Peak grad:  5.9 mmHg AV Area (VTI):     2.25 cm AV Vmax:           204.00 cm/s AV Vmean:          128.000 cm/s AV VTI:            0.444 m AV Peak Grad:      16.6 mmHg AV Mean Grad:      8.0 mmHg  LVOT Vmax:         155.00 cm/s LVOT Vmean:        108.000 cm/s LVOT VTI:          0.318 m LVOT/AV VTI ratio: 0.72  AORTA Ao Root diam: 2.80 cm  MITRAL VALVE                TRICUSPID VALVE MV Area (PHT): 3.13 cm     TR Peak grad:   24.6 mmHg MV Area VTI:   3.86 cm     TR Vmax:        248.00 cm/s MV Peak grad:  7.2 mmHg MV Mean grad:  4.0 mmHg     SHUNTS MV Vmax:       1.34 m/s     Systemic VTI:  0.32 m MV Vmean:      88.2 cm/s    Systemic Diam: 2.00 cm MV Decel Time: 242 msec MV E velocity: 107.00 cm/s MV A velocity: 89.40 cm/s MV E/A ratio:  1.20 Dorris Carnes MD Electronically signed by Dorris Carnes MD Signature Date/Time: 12/16/2021/3:38:01 PM    Final    US LIVER DOPPLER  Result Date: 12/21/2021 CLINICAL DATA:  55 year old female with cirrhosis EXAM: DUPLEX ULTRASOUND OF LIVER TECHNIQUE: Color and duplex Doppler ultrasound was performed to evaluate the hepatic in-flow and out-flow vessels. COMPARISON:  None. FINDINGS: Portal Vein Velocities Main:  58 cm/sec Right:  57 cm/sec Left: Not visualized Hepatic Vein Velocities Right:  34 cm/sec Middle: Not visualized Left:  60 cm/sec Hepatic Artery Velocity:  78 cm/sec Splenic Vein Velocity:  59 cm/sec Varices: Present Ascites: Present Recanalized umbilical vein. Cirrhotic morphology of the liver IMPRESSION: Cirrhotic morphology of the liver with stigmata of portal hypertension including recanalized umbilical vein, additional varices, and ascites Signed, Dulcy Fanny. Dellia Nims, RPVI [facility] Electronically Signed   By: Corrie Mckusick D.O.   On: 12/21/2021 12:23   CT EXTREM LOWER W CM BIL  Result Date: 12/16/2021 CLINICAL DATA:  Soft tissue infection of the lower extremities bilaterally EXAM: CT OF THE LOWER BILATERAL EXTREMITY WITH CONTRAST TECHNIQUE: Multidetector CT imaging of the lower bilateral extremity was performed with axial images obtained from the acetabular roofs through the proximal tibial metadiaphysis following intravenous contrast administration. COMPARISON:  None. CONTRAST:  164m OMNIPAQUE IOHEXOL 300 MG/ML  SOLN FINDINGS:  Bones/Joint/Cartilage Normal alignment. No acute fracture or dislocation. No osseous erosion or abnormal periosteal reaction. 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 Ligaments Suboptimally assessed by CT. Muscles and Tendons Unremarkable Soft tissues There is marked diffuse subcutaneous edema involving the lower extremities bilaterally circumferentially. Edematous changes extend into the inter fascial planes of the anterior, posterior, and medial muscular compartments of the thighs bilaterally. No discrete drainable fluid collection identified. Small bilateral knee effusions are present. Extensive subcutaneous edema is noted within the visualized pannus bilaterally. Large volume ascites is present within the visualized pelvis. IMPRESSION: Extensive subcutaneous edema involving the lower extremities bilaterally and pannus. Infiltrative changes extend into the muscular compartments of the thighs bilaterally. No discrete drainable fluid collection is identified, however. Small bilateral knee effusions. Large volume ascites. Electronically Signed   By: AFidela SalisburyM.D.   On: 12/16/2021 02:12   [place]KG SITE RITE  Result Date: 12/16/2021 If Site Rite image not attached, placement could not be confirmed due to current cardiac rhythm.   Microbiology: Recent Results (from the past 240 hour(s))  Resp Panel by RT-PCR (  Flu A&B, Covid) Nasopharyngeal Swab     Status: None   Collection Time: 12/15/21  9:33 PM   Specimen: Nasopharyngeal Swab; Nasopharyngeal(NP) swabs in vial transport medium  Result Value Ref Range Status   SARS Coronavirus 2 by RT PCR NEGATIVE NEGATIVE Final    Comment: (NOTE) SARS-CoV-2 target nucleic acids are NOT DETECTED.  The SARS-CoV-2 RNA is generally detectable in upper respiratory specimens during the acute phase of infection. The lowest concentration of SARS-CoV-2 viral copies this assay can detect is 138 copies/mL. A negative result does not  preclude SARS-Cov-2 infection and should not be used as the sole basis for treatment or other patient management decisions. A negative result may occur with  improper specimen collection/handling, submission of specimen other than nasopharyngeal swab, presence of viral mutation(s) within the areas targeted by this assay, and inadequate number of viral copies(<138 copies/mL). A negative result must be combined with clinical observations, patient history, and epidemiological information. The expected result is Negative.  Fact Sheet for Patients:  EntrepreneurPulse.com.au  Fact Sheet for Healthcare Providers:  IncredibleEmployment.be  This test is no t yet approved or cleared by the [place] FDA and  has been authorized for detection and/or diagnosis of SARS-CoV-2 by FDA under an Emergency Use Authorization (EUA). This EUA will remain  in effect (meaning this test can be used) for the duration of the COVID-19 declaration under Section 564(b)(1) of the Act, 21 U.S.C.section 360bbb-3(b)(1), unless the authorization is terminated  or revoked sooner.       Influenza A by PCR NEGATIVE NEGATIVE Final   Influenza B by PCR NEGATIVE NEGATIVE Final    Comment: (NOTE) The Xpert Xpress SARS-CoV-2/FLU/RSV plus assay is intended as an aid in the diagnosis of influenza from Nasopharyngeal swab specimens and should not be used as a sole basis for treatment. Nasal washings and aspirates are unacceptable for Xpert Xpress SARS-CoV-2/FLU/RSV testing.  Fact Sheet for Patients: EntrepreneurPulse.com.au  Fact Sheet for Healthcare Providers: IncredibleEmployment.be  This test is not yet approved or cleared by the [place] FDA and has been authorized for detection and/or diagnosis of SARS-CoV-2 by FDA under an Emergency Use Authorization (EUA). This EUA will remain in effect (meaning this test can be used) for the  duration of the COVID-19 declaration under Section 564(b)(1) of the Act, 21 U.S.C. section 360bbb-3(b)(1), unless the authorization is terminated or revoked.  Performed at [facility], [address] [place], [place] [address]   Blood Culture (routine x 2)     Status: None   Collection Time: 12/15/21 10:15 PM   Specimen: BLOOD RIGHT ARM  Result Value Ref Range Status   Specimen Description BLOOD RIGHT ARM  Final   Special Requests   Final    BOTTLES DRAWN AEROBIC AND ANAEROBIC Blood Culture results may not be optimal due to an inadequate volume of blood received in culture bottles   Culture   Final    NO GROWTH 5 DAYS Performed at [facility], [address], [place], [place] [address]    Report Status 12/20/2021 FINAL  Final  Blood Culture (routine x 2)     Status: None   Collection Time: 12/15/21 10:15 PM   Specimen: Right Antecubital; Blood  Result Value Ref Range Status   Specimen Description RIGHT ANTECUBITAL  Final   Special Requests   Final    BOTTLES DRAWN AEROBIC AND ANAEROBIC Blood Culture results may not be optimal due to an excessive volume of blood received in culture bottles  Culture   Final    NO GROWTH 5 DAYS Performed at [facility], [address] [place], [place] [address]    Report Status 12/20/2021 FINAL  Final  Urine Culture     Status: Abnormal   Collection Time: 12/16/21 12:59 PM   Specimen: Urine, Catheterized  Result Value Ref Range Status   Specimen Description   Final    URINE, CATHETERIZED Performed at [facility], [address], [place], [place] [address]    Special Requests   Final    NONE Performed at [facility], [address] [place], [place] [address]    Culture MULTIPLE SPECIES PRESENT, SUGGEST RECOLLECTION (A)  Final   Report Status 12/17/2021 FINAL  Final  MRSA Next Gen by PCR, Nasal     Status: Abnormal   Collection Time: 12/18/21  8:00 AM   Specimen: Nasal Mucosa; Nasal Swab  Result Value Ref Range Status   MRSA by PCR  Next Gen DETECTED (A) NOT DETECTED Final    Comment: RESULT CALLED TO, READ BACK BY AND VERIFIED WITH: SMITH @ 1026 ON [id number] BY HENDERSON L (NOTE) The GeneXpert MRSA Assay (FDA approved for NASAL specimens only), is one component of a comprehensive MRSA colonization surveillance program. It is not intended to diagnose MRSA infection nor to guide or monitor treatment for MRSA infections. Test performance is not FDA approved in patients less than 7 years old. Performed at [facility], [address], [place], [place] [address]   Acid Fast Smear (AFB)     Status: None   Collection Time: 12/21/21 12:20 PM   Specimen: Peritoneal Cavity; Body Fluid  Result Value Ref Range Status   AFB Specimen Processing Concentration  Final   Acid Fast Smear Negative  Final    Comment: (NOTE) Performed At: [facility] [place], [place] [id number] Rush Farmer MD OE:[id number]    Source (AFB) PERITONEAL  Final    Comment: Performed at [facility], [address], [place], [place] [address]  Gram stain     Status: None   Collection Time: 12/21/21 12:20 PM   Specimen: Peritoneal Washings  Result Value Ref Range Status   Specimen Description PERITONEAL  Final   Special Requests NONE  Final   Gram Stain   Final    NO ORGANISMS SEEN NO WBC SEEN CYTOSPIN SMEAR Performed at [facility], [address], [place], [place] [address]    Report Status 12/21/2021 FINAL  Final  Culture, body fluid w Gram Stain-bottle     Status: None (Preliminary result)   Collection Time: 12/21/21 12:20 PM   Specimen: Peritoneal Washings  Result Value Ref Range Status   Specimen Description PERITONEAL  Final   Special Requests   Final    BOTTLES DRAWN AEROBIC AND ANAEROBIC Blood Culture results may not be optimal due to an excessive volume of blood received in culture bottles   Culture   Final    NO GROWTH 4 DAYS Performed at [facility], [address], [place], [place] [address]    Report  Status PENDING  Incomplete     Labs: Basic Metabolic Panel: Recent Labs  Lab 12/21/21 1021 12/22/21 0524 12/23/21 0532 12/24/21 0456 12/25/21 0750  NA 135 137 138 139 138  K 3.4* 3.4* 3.3* 3.0* 3.4*  CL 103 106 106 106 105  CO2 _0 GLUCOSE 116* 75 75 83 99  BUN _1 CREATININE 0.61 0.56 0.71 0.73  0.83  CALCIUM 8.0* 8.2* 8.4* 8.4* 7.9*   Liver Function Tests: Recent Labs  Lab 12/19/21 0524 12/21/21 1021 12/22/21 0524 12/23/21 0532 12/24/21 0456  AST 28 38 _0 ALT _1 ALKPHOS 91 101 61 52 51  BILITOT 1.6* 1.9* 1.9* 1.6* 2.1*  PROT 4.5* 4.8* 4.6* 5.1* 5.0*  ALBUMIN 2.3* 2.3* 3.0* 3.9 3.6   No results for input(s): LIPASE, AMYLASE in the last 168 hours. No results for input(s): AMMONIA in the last 168 hours. CBC: Recent Labs  Lab 12/20/21 0424 12/21/21 1021 12/23/21 0532 12/23/21 0847 12/24/21 0456  WBC 8.0 9.1 2.9* 3.2* 3.5*  NEUTROABS  --  6.4  --   --   --   HGB 10.4* 10.9* 8.0* 8.3* 8.5*  HCT 31.8* 33.1* 24.7* 25.6* 26.4*  MCV 90.9 92.7 95.0 94.1 93.6  PLT 116* 124* 70* 78* 77*   Cardiac Enzymes: No results for input(s): CKTOTAL, CKMB, CKMBINDEX, TROPONINI in the last 168 hours. BNP: Invalid input(s): POCBNP CBG: No results for input(s): GLUCAP in the last 168 hours.  Time coordinating discharge:  36 minutes  Signed:  Orson Eva, DO [facility] Pager: [phone] 12/25/2021, 12:58 PM

## 2021-12-26 LAB — CULTURE, BODY FLUID W GRAM STAIN -BOTTLE: Culture: NO GROWTH

## 2021-12-26 NOTE — Progress Notes (Signed)
Report has been given to [facility] and spouse was informed that patient was transported there.

## 2021-12-26 NOTE — Discharge Summary (Signed)
Physician Discharge Summary  Adisa Vigeant XMI:[id number] DOB: 06-Apr-1966 DOA: 12/15/2021  PCP: Keane Police, MD  Admit date: 12/15/2021 Discharge date: 12/26/2021  Admitted From: SNF Disposition:  SNF  Recommendations for Outpatient Follow-up:  Follow up with PCP in 1-2 weeks Please obtain BMP/CBC in one week    Discharge Condition: Stable CODE STATUS:FULL Diet recommendation: Low Sodium   Brief/Interim Summary: 02/16/21  Kristie Brooks  is a 56 y.o. female, with history of cirrhosis, presents ED for chief complaint of what she says is fluid overload.  What was reported by EMS states that she was sent in for fever and confusion.  Does report that her husband said she was confused that she has been thinks that she was.  She reports that she had fluid overload in her legs and thighs for 3 or 4 days.  She reports they have been weeping for 3 or 4 days.  She is also had erythema for 3 or 4 days.  She reports that all the symptoms started the same time.  She had fevers but she is unsure how high.  She denies any confusion.  She denies any cough, dysuria, chest pain, and vomiting.  She does report nausea intermittently since September.  She denies any diarrhea.  Patient reports no change in her normal abdominal pain.  She has had abdominal pain for a long time per her report.  Patient reports no purulent drainage from her bilateral legs.  Bilateral legs are wrapped by the ED at the time of my exam.  She does not have a history of an echo on file with our system.  Patient has no further complaints at this time.   Patient does not smoke, does not drink, does not use illicit drugs.  Patient is vaccinated for COVID.  Patient is full code.  Patient used to be a Marine scientist.   In the ED Temp 99.2, heart rate 100-118, respiratory 17-27, blood pressure at admission 84/34 but as low as systolic in the 82N Troponin 6, 7 Elevated white blood cell count 15.9, hemoglobin 11.3, platelets  126 Chemistry panel is unremarkable Albumin 1.9 Negative respiratory panel Blood culture pending CT pelvis shows marked diffuse subcu body wall edema with large volume ascites.  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 Together these findings are most in keeping with changes of portal venous hypertension Chest x-ray shows low lung volumes with mild to moderate severity bibasilar atelectasis and/or infiltrate with small left pleural effusion Patient was given cefepime, vancomycin however 3 L bolus Cintron was discussed with patient and patient declined.  She reports she still is 1 to be full code, but does not want a central line at this time.  Options for albumin discussed with patient.  Patient accepts albumin. Admission requested for possible sepsis secondary cellulitis.  She was started on vancomycin with resolution of her sepsis physiology.  She initially refused endoscopy on numerous occasions.  Ultimately she agreed and underwent EGD on 12/30 which revealed no varices; +portal hypertensive gastropathy/duodenopathy; Erosions noted at distal esophagus proximal to GE junction  Discharge Diagnoses:  severe sepsis secondary to cellulitis -Bilateral lower extremity cellulitis appreciated -Patient met criteria at time of admission with tachycardia, elevated respiratory rate, elevated white blood cell count, low blood pressure, lactic acid 2.3 and also on presentation confusion as part of organ dysfunction. -Currently afebrile, mentation has significantly improved and back to baseline. -initially started on IV vanco -blood cultures neg -Ongoing lower extremity discomfort still present;  overall erythematous changes and edema continue to improve slowly. -Continue to follow recommendations by wound care service. -change vanc>>doxy -she stated doxy caused her to be nauseous>>switch to bactrim DS x 3 more days post discharge -sepsis physiology resolved   acute metabolic  encephalopathy -In the setting of sepsis -Treatment as mentioned above -Ammonia level within normal limit -Minimize the use of medication that can alter sedation. -Continue providing constant reorientation -Patient mentation back to baseline.   Decompensated Liver cirrhosis with ascites -Continue low-sodium diet, daily weights and strict I's and O's.   -restarted torsemide -Patient has received albumin infusion with some improvement in blood pressure. -GI recommending paracentesis for fluid analysis (if not too much fluid to be taken). -12/27 paracentesis-->4L removed, only 62 WBC (doubt SBP)>>AFB smear neg, cult neg so far -vitamin K given 12/29 -12/29 EGD--no varices; +portal hypertensive gastropathy/duodenopathy;  Erosions noted at distal esophagus proximal to GE junction   Venous stasis dermatitis lower extremity edema -Continue dressing care and wound care recommendations. -Cut to fit calcium alginate and place over wounds, top with ABD pad. Secure with kerlix and use 4" ACE wraps from toes to knees. Change daily.  Elevate legs as much as possible   hypothyroidism -TSH elevated -Synthroid dose has been started; patient initially refusing medication unrelated and acceptance to take. -Plan is to repeat thyroid panel in 4-6 weeks with further adjustment to her Synthroid regimen at that time.   class III obesity -Portion control and low calorie diet recommended. -Body mass index is 40.94 kg/m.   hypotension -Low blood pressure running at baseline in the setting of cirrhosis: Acute infection contributing and playing a role in her presentation. -Blood pressure overall improved and off pressors but remains soft -Continue adjusted dose of midodrine 3 times a day.    constipation -Continue the use of lactulose. -now having 2-3 BMs per day   chronic respiratory failure with hypoxia -Patient reports using 3-4 L nasal cannula supplementation at least for the last couple of months  prior to admission. -Currently 2 L in place with good O2 sat -Continue the use of IS and flutter valve.   dark hematemesis -patient reported never had EGD in the past; after discussing with GI here, she refused EGD initially -concerning for gastritis and ? Varices given hx of cirrhosis -Hgb overall stable ~8 -not further episode of vomiting -tolerating diet; no further hematemesis;  only one episode -continue PPI bid -initially refused EGD, subsequently agreed 12/28 -12/30 EGD--no varices; +portal hypertensive gastropathy/duodenopathy; Erosions noted at distal esophagus proximal to GE junction   Hypokalemia -replete -give dailly KCl    Discharge Instructions   Allergies as of 12/26/2021       Reactions   Dakin's [sodium Hypochlorite] Dermatitis   Zosyn [piperacillin Sod-tazobactam So] Other (See Comments)   Blood count dropped        Medication List     TAKE these medications    acetaminophen 325 MG tablet Commonly known as: TYLENOL Take 650 mg by mouth every 6 (six) hours as needed for moderate pain.   calcium carbonate 500 MG chewable tablet Commonly known as: TUMS - dosed in mg elemental calcium Chew 1 tablet by mouth daily.   clobetasol ointment 0.05 % Commonly known as: TEMOVATE Apply 1 application topically 2 (two) times daily.   fexofenadine 180 MG tablet Commonly known as: ALLEGRA Take by mouth.   fish oil-omega-3 fatty acids 1000 MG capsule Take 1 g by mouth daily.   folic acid 1 MG tablet  Commonly known as: FOLVITE Take 1 mg by mouth daily.   lactulose 10 GM/15ML solution Commonly known as: CHRONULAC Take 15 mLs (10 g total) by mouth daily.   levothyroxine 50 MCG tablet Commonly known as: SYNTHROID Take 1 tablet (50 mcg total) by mouth daily before breakfast. What changed:  medication strength how much to take   magnesium oxide 400 MG tablet Commonly known as: MAG-OX Take 400 mg by mouth daily.   midodrine 10 MG tablet Commonly known  as: PROAMATINE Take 1 tablet (10 mg total) by mouth 3 (three) times daily with meals.   oxyCODONE 5 MG immediate release tablet Commonly known as: Oxy IR/ROXICODONE Take 5-10 mg by mouth every 4 (four) hours as needed for severe pain.   pantoprazole 40 MG tablet Commonly known as: PROTONIX Take 1 tablet (40 mg total) by mouth 2 (two) times daily before a meal.   potassium chloride 10 MEQ tablet Commonly known as: KLOR-CON M Take 10 mEq by mouth daily.   sulfamethoxazole-trimethoprim 800-160 MG tablet Commonly known as: BACTRIM DS Take 1 tablet by mouth every 12 (twelve) hours. X 3 days   thiamine 250 MG tablet Take 250 mg by mouth daily.   torsemide 20 MG tablet Commonly known as: DEMADEX Take 20 mg by mouth daily.   vitamin B-12 250 MCG tablet Commonly known as: CYANOCOBALAMIN Take 250 mcg by mouth daily.   vitamin C 1000 MG tablet Take 1,000 mg by mouth daily.   Vitamin D (Ergocalciferol) 1.25 MG (50000 UNIT) Caps capsule Commonly known as: DRISDOL Take 50,000 Units by mouth every 7 (seven) days. Sundays   vitamin E 200 UNIT capsule Take 400 Units by mouth daily.        Allergies  Allergen Reactions   Dakin's [Sodium Hypochlorite] Dermatitis   Zosyn [Piperacillin Sod-Tazobactam So] Other (See Comments)    Blood count dropped    Consultations: GI   Procedures/Studies: CT PELVIS W CONTRAST  Result Date: 12/16/2021 CLINICAL DATA:  Soft tissue infection EXAM: CT PELVIS WITH CONTRAST TECHNIQUE: Multidetector CT imaging of the pelvis was performed using the standard protocol following the bolus administration of intravenous contrast. CONTRAST:  19mL OMNIPAQUE IOHEXOL 300 MG/ML  SOLN COMPARISON:  None. FINDINGS: Urinary Tract:  No abnormality visualized. Bowel: Unremarkable visualized pelvic bowel loops. There is large volume ascites present within the pelvis. Vascular/Lymphatic: A massive varix is seen extending from the umbilicus to the left common iliac vein,  likely representing a portosystemic collateral in the setting of portal venous hypertension. No pathologic adenopathy within the abdomen and pelvis. Reproductive: Involuted fibroids noted within the uterus. The pelvic organs are otherwise unremarkable. Other: There is extensive diffuse subcutaneous body wall noted within the visualized abdominal wall and flanks bilaterally. Musculoskeletal: No acute bone abnormality. No lytic or blastic bone lesion. IMPRESSION: Marked diffuse subcutaneous body wall edema. Large volume ascites. Massive varix extending from the umbilicus the left common iliac vein. Together, the findings are most in keeping with changes of portal venous hypertension. Electronically Signed   By: Fidela Salisbury M.D.   On: 12/16/2021 02:16   US Paracentesis  Result Date: 12/21/2021 INDICATION: Ascites.  Hepatitis-C.  Cirrhosis. EXAM: ULTRASOUND GUIDED  PARACENTESIS MEDICATIONS: None. COMPLICATIONS: None immediate. PROCEDURE: Informed written consent was obtained from the patient after a discussion of the risks (including hemorrhage and infection, among others), benefits and alternatives to treatment. A timeout was performed prior to the initiation of the procedure. Initial ultrasound scanning demonstrates a large amount of ascites within  ascites within the right lower abdominal quadrant. The right lower abdomen was prepped and draped in the usual sterile fashion. 1% lidocaine was used for local anesthesia. An ultrasound image was saved for documentation purposes. Following this, a Yueh catheter was introduced. The paracentesis was performed. The catheter was removed and a dressing was applied. The patient tolerated the procedure well without immediate post procedural complication. FINDINGS: A total of approximately 4 L of straw-colored fluid was removed. Samples were sent to the laboratory as requested by the clinical team. IMPRESSION: Successful ultrasound-guided paracentesis yielding 4 liters of peritoneal  fluid. Electronically Signed   By: Van Clines M.D.   On: 12/21/2021 14:09   DG Chest Port 1 View  Result Date: 12/15/2021 CLINICAL DATA:  Fever and lower extremity swelling. EXAM: PORTABLE CHEST 1 VIEW COMPARISON:  November 10, 2021 FINDINGS: Low lung volumes are seen. Mild to moderate severity areas of atelectasis and/or infiltrate are seen within the bilateral lung bases. There is a small left pleural effusion. No pneumothorax is identified. The heart size and mediastinal contours are within normal limits. Degenerative changes are noted throughout the thoracic spine. IMPRESSION: 1. Low lung volumes with mild to moderate severity bibasilar atelectasis and/or infiltrate. 2. Small left pleural effusion. Electronically Signed   By: Virgina Norfolk M.D.   On: 12/15/2021 22:47   ECHOCARDIOGRAM COMPLETE  Result Date: 12/16/2021    ECHOCARDIOGRAM REPORT   Patient Name:   AMENA DOCKHAM Date of Exam: 12/16/2021 Medical Rec #:  [id number]           Height:       65.0 in Accession #:    [id number]          Weight:       290.0 lb Date of Birth:  September 15, 1966           BSA:          2.317 m Patient Age:    62 years            BP:           73/36 mmHg Patient Gender: F                   HR:           106 bpm. Exam Location:  [facility] Procedure: 2D Echo, Cardiac Doppler and Color Doppler Indications:    Edema  History:        Patient has no prior history of Echocardiogram examinations.                 Signs/Symptoms:Edema.  Sonographer:    Wenda Low Referring Phys: [id number] ASIA B [place]  Sonographer Comments: Patient is morbidly obese and no subcostal window. IMPRESSIONS  1. Left ventricular ejection fraction, by estimation, is 65 to 70%. The left ventricle has normal function. The left ventricle has no regional wall motion abnormalities. The left ventricular internal cavity size was mildly dilated. There is mild left ventricular hypertrophy. Left ventricular diastolic parameters were normal.   2. Right ventricular systolic function is normal. The right ventricular size is normal. There is normal pulmonary artery systolic pressure.  3. The mitral valve is normal in structure. Trivial mitral valve regurgitation.  4. The aortic valve is tricuspid. Aortic valve regurgitation is not visualized. Aortic valve sclerosis is present, with no evidence of aortic valve stenosis. FINDINGS  Left Ventricle: Left ventricular ejection fraction, by estimation, is 65 to 70%. The left ventricle has normal function. The  left ventricle has no regional wall motion abnormalities. The left ventricular internal cavity size was mildly dilated. There is  mild left ventricular hypertrophy. Left ventricular diastolic parameters were normal. Right Ventricle: The right ventricular size is normal. Right vetricular wall thickness was not assessed. Right ventricular systolic function is normal. There is normal pulmonary artery systolic pressure. The tricuspid regurgitant velocity is 2.48 m/s, and with an assumed right atrial pressure of 8 mmHg, the estimated right ventricular systolic pressure is 29.4 mmHg. Left Atrium: Left atrial size was normal in size. Right Atrium: Right atrial size was normal in size. Pericardium: There is no evidence of pericardial effusion. Mitral Valve: The mitral valve is normal in structure. Trivial mitral valve regurgitation. MV peak gradient, 7.2 mmHg. The mean mitral valve gradient is 4.0 mmHg. Tricuspid Valve: The tricuspid valve is normal in structure. Tricuspid valve regurgitation is trivial. Aortic Valve: The aortic valve is tricuspid. Aortic valve regurgitation is not visualized. Aortic valve sclerosis is present, with no evidence of aortic valve stenosis. Aortic valve mean gradient measures 8.0 mmHg. Aortic valve peak gradient measures 16.6 mmHg. Aortic valve area, by VTI measures 2.25 cm. Pulmonic Valve: The pulmonic valve was normal in structure. Pulmonic valve regurgitation is not visualized. Aorta:  The aortic root is normal in size and structure. IAS/Shunts: No atrial level shunt detected by color flow Doppler.  LEFT VENTRICLE PLAX 2D LVIDd:         5.20 cm     Diastology LVIDs:         3.20 cm     LV e' medial:    13.40 cm/s LV PW:         1.10 cm     LV E/e' medial:  8.0 LV IVS:        1.00 cm     LV e' lateral:   16.80 cm/s LVOT diam:     2.00 cm     LV E/e' lateral: 6.4 LV SV:         100 LV SV Index:   43 LVOT Area:     3.14 cm  LV Volumes (MOD) LV vol d, MOD A2C: 75.0 ml LV vol d, MOD A4C: 69.1 ml LV vol s, MOD A2C: 32.3 ml LV vol s, MOD A4C: 27.0 ml LV SV MOD A2C:     42.7 ml LV SV MOD A4C:     69.1 ml LV SV MOD BP:      42.2 ml RIGHT VENTRICLE RV Basal diam:  3.30 cm RV Mid diam:    4.00 cm RV S prime:     19.20 cm/s TAPSE (M-mode): 3.1 cm LEFT ATRIUM             Index        RIGHT ATRIUM           Index LA diam:        4.50 cm 1.94 cm/m   RA Area:     17.50 cm LA Vol (A2C):   48.1 ml 20.76 ml/m  RA Volume:   41.20 ml  17.79 ml/m LA Vol (A4C):   92.3 ml 39.84 ml/m LA Biplane Vol: 71.3 ml 30.78 ml/m  AORTIC VALVE                     PULMONIC VALVE AV Area (Vmax):    2.39 cm      PV Vmax:       1.21 m/s AV Area (Vmean):   2.65  cm      PV Peak grad:  5.9 mmHg AV Area (VTI):     2.25 cm AV Vmax:           204.00 cm/s AV Vmean:          128.000 cm/s AV VTI:            0.444 m AV Peak Grad:      16.6 mmHg AV Mean Grad:      8.0 mmHg LVOT Vmax:         155.00 cm/s LVOT Vmean:        108.000 cm/s LVOT VTI:          0.318 m LVOT/AV VTI ratio: 0.72  AORTA Ao Root diam: 2.80 cm MITRAL VALVE                TRICUSPID VALVE MV Area (PHT): 3.13 cm     TR Peak grad:   24.6 mmHg MV Area VTI:   3.86 cm     TR Vmax:        248.00 cm/s MV Peak grad:  7.2 mmHg MV Mean grad:  4.0 mmHg     SHUNTS MV Vmax:       1.34 m/s     Systemic VTI:  0.32 m MV Vmean:      88.2 cm/s    Systemic Diam: 2.00 cm MV Decel Time: 242 msec MV E velocity: 107.00 cm/s MV A velocity: 89.40 cm/s MV E/A ratio:  1.20 Dorris Carnes MD  Electronically signed by Dorris Carnes MD Signature Date/Time: 12/16/2021/3:38:01 PM    Final    US LIVER DOPPLER  Result Date: 12/21/2021 CLINICAL DATA:  56 year old female with cirrhosis EXAM: DUPLEX ULTRASOUND OF LIVER TECHNIQUE: Color and duplex Doppler ultrasound was performed to evaluate the hepatic in-flow and out-flow vessels. COMPARISON:  None. FINDINGS: Portal Vein Velocities Main:  58 cm/sec Right:  57 cm/sec Left: Not visualized Hepatic Vein Velocities Right:  34 cm/sec Middle: Not visualized Left:  60 cm/sec Hepatic Artery Velocity:  78 cm/sec Splenic Vein Velocity:  59 cm/sec Varices: Present Ascites: Present Recanalized umbilical vein. Cirrhotic morphology of the liver IMPRESSION: Cirrhotic morphology of the liver with stigmata of portal hypertension including recanalized umbilical vein, additional varices, and ascites Signed, Dulcy Fanny. Dellia Nims, RPVI [facility] Electronically Signed   By: Corrie Mckusick D.O.   On: 12/21/2021 12:23   CT EXTREM LOWER W CM BIL  Result Date: 12/16/2021 CLINICAL DATA:  Soft tissue infection of the lower extremities bilaterally EXAM: CT OF THE LOWER BILATERAL EXTREMITY WITH CONTRAST TECHNIQUE: Multidetector CT imaging of the lower bilateral extremity was performed with axial images obtained from the acetabular roofs through the proximal tibial metadiaphysis following intravenous contrast administration. COMPARISON:  None. CONTRAST:  124m OMNIPAQUE IOHEXOL 300 MG/ML  SOLN FINDINGS: Bones/Joint/Cartilage Normal alignment. No acute fracture or dislocation. No osseous erosion or abnormal periosteal reaction. 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 Ligaments Suboptimally assessed by CT. Muscles and Tendons Unremarkable Soft tissues There is marked diffuse subcutaneous edema involving the lower extremities bilaterally circumferentially. Edematous changes extend into the  inter fascial planes of the anterior, posterior, and medial muscular compartments of the thighs bilaterally. No discrete drainable fluid collection identified. Small bilateral knee effusions are present. Extensive subcutaneous edema is noted within the visualized pannus bilaterally. Large volume ascites is present within the visualized pelvis. IMPRESSION: Extensive subcutaneous edema involving the lower extremities bilaterally  the muscular compartments of the thighs bilaterally. No discrete drainable fluid collection is identified, however. Small bilateral knee effusions. Large volume ascites. Electronically Signed   By: Fidela Salisbury M.D.   On: 12/16/2021 02:12   [place] EKG SITE RITE  Result Date: 12/16/2021 If Site Rite image not attached, placement could not be confirmed due to current cardiac rhythm.       Discharge Exam: Vitals:   12/26/21 0613 12/26/21 1225  BP: 117/79 125/65  Pulse: 84 83  Resp: 16 17  Temp: 99 F (37.2 C) 98.8 F (37.1 C)  SpO2: 100% 90%   Vitals:   12/25/21 2158 12/26/21 0607 12/26/21 0613 12/26/21 1225  BP: (!) 115/58 (!) 110/58 117/79 125/65  Pulse: 88 89 84 83  Resp:  $Remo'18 16 17  'JrbyM$ Temp: 99.1 F (37.3 C) 99 F (37.2 C) 99 F (37.2 C) 98.8 F (37.1 C)  TempSrc: Oral Oral Oral   SpO2: 91% 90% 100% 90%  Weight:   111.4 kg   Height:        General: Pt is alert, awake, not in acute distress Cardiovascular: RRR, S1/S2 +, no rubs, no gallops Respiratory: bibasilar crackles. No wheeze Abdominal: Soft, NT, ND, bowel sounds + Extremities: 1 +LE edema, no cyanosis   The results of significant diagnostics from this hospitalization (including imaging, microbiology, ancillary and laboratory) are listed below for reference.    Significant Diagnostic Studies: CT PELVIS W CONTRAST  Result Date: 12/16/2021 CLINICAL DATA:  Soft tissue infection EXAM: CT PELVIS WITH CONTRAST TECHNIQUE: Multidetector CT imaging of the  pelvis was performed using the standard protocol following the bolus administration of intravenous contrast. CONTRAST:  163mL OMNIPAQUE IOHEXOL 300 MG/ML  SOLN COMPARISON:  None. FINDINGS: Urinary Tract:  No abnormality visualized. Bowel: Unremarkable visualized pelvic bowel loops. There is large volume ascites present within the pelvis. Vascular/Lymphatic: A massive varix is seen extending from the umbilicus to the left common iliac vein, likely representing a portosystemic collateral in the setting of portal venous hypertension. No pathologic adenopathy within the abdomen and pelvis. Reproductive: Involuted fibroids noted within the uterus. The pelvic organs are otherwise unremarkable. Other: There is extensive diffuse subcutaneous body wall noted within the visualized abdominal wall and flanks bilaterally. Musculoskeletal: No acute bone abnormality. No lytic or blastic bone lesion. IMPRESSION: Marked diffuse subcutaneous body wall edema. Large volume ascites. Massive varix extending from the umbilicus the left common iliac vein. Together, the findings are most in keeping with changes of portal venous hypertension. Electronically Signed   By: Fidela Salisbury M.D.   On: 12/16/2021 02:16   US Paracentesis  Result Date: 12/21/2021 INDICATION: Ascites.  Hepatitis-C.  Cirrhosis. EXAM: ULTRASOUND GUIDED  PARACENTESIS MEDICATIONS: None. COMPLICATIONS: None immediate. PROCEDURE: Informed written consent was obtained from the patient after a discussion of the risks (including hemorrhage and infection, among others), benefits and alternatives to treatment. A timeout was performed prior to the initiation of the procedure. Initial ultrasound scanning demonstrates a large amount of ascites within the right lower abdominal quadrant. The right lower abdomen was prepped and draped in the usual sterile fashion. 1% lidocaine was used for local anesthesia. An ultrasound image was saved for documentation purposes. Following this,  a Yueh catheter was introduced. The paracentesis was performed. The catheter was removed and a dressing was applied. The patient tolerated the procedure well without immediate post procedural complication. FINDINGS: A total of approximately 4 L of straw-colored fluid was removed. Samples were sent to the laboratory as requested by  the clinical team. IMPRESSION: Successful ultrasound-guided paracentesis yielding 4 liters of peritoneal fluid. Electronically Signed   By: Van Clines M.D.   On: 12/21/2021 14:09   DG Chest Port 1 View  Result Date: 12/15/2021 CLINICAL DATA:  Fever and lower extremity swelling. EXAM: PORTABLE CHEST 1 VIEW COMPARISON:  November 10, 2021 FINDINGS: Low lung volumes are seen. Mild to moderate severity areas of atelectasis and/or infiltrate are seen within the bilateral lung bases. There is a small left pleural effusion. No pneumothorax is identified. The heart size and mediastinal contours are within normal limits. Degenerative changes are noted throughout the thoracic spine. IMPRESSION: 1. Low lung volumes with mild to moderate severity bibasilar atelectasis and/or infiltrate. 2. Small left pleural effusion. Electronically Signed   By: Virgina Norfolk M.D.   On: 12/15/2021 22:47   ECHOCARDIOGRAM COMPLETE  Result Date: 12/16/2021    ECHOCARDIOGRAM REPORT   Patient Name:   Kristie Brooks Date of Exam: 12/16/2021 Medical Rec #:  [id number]           Height:       65.0 in Accession #:    [id number]          Weight:       290.0 lb Date of Birth:  Oct 23, 1966           BSA:          2.317 m Patient Age:    55 years            BP:           73/36 mmHg Patient Gender: F                   HR:           106 bpm. Exam Location:  [facility] Procedure: 2D Echo, Cardiac Doppler and Color Doppler Indications:    Edema  History:        Patient has no prior history of Echocardiogram examinations.                 Signs/Symptoms:Edema.  Sonographer:    Wenda Low Referring Phys:  [id number] ASIA B [place]  Sonographer Comments: Patient is morbidly obese and no subcostal window. IMPRESSIONS  1. Left ventricular ejection fraction, by estimation, is 65 to 70%. The left ventricle has normal function. The left ventricle has no regional wall motion abnormalities. The left ventricular internal cavity size was mildly dilated. There is mild left ventricular hypertrophy. Left ventricular diastolic parameters were normal.  2. Right ventricular systolic function is normal. The right ventricular size is normal. There is normal pulmonary artery systolic pressure.  3. The mitral valve is normal in structure. Trivial mitral valve regurgitation.  4. The aortic valve is tricuspid. Aortic valve regurgitation is not visualized. Aortic valve sclerosis is present, with no evidence of aortic valve stenosis. FINDINGS  Left Ventricle: Left ventricular ejection fraction, by estimation, is 65 to 70%. The left ventricle has normal function. The left ventricle has no regional wall motion abnormalities. The left ventricular internal cavity size was mildly dilated. There is  mild left ventricular hypertrophy. Left ventricular diastolic parameters were normal. Right Ventricle: The right ventricular size is normal. Right vetricular wall thickness was not assessed. Right ventricular systolic function is normal. There is normal pulmonary artery systolic pressure. The tricuspid regurgitant velocity is 2.48 m/s, and with an assumed right atrial pressure of 8 mmHg, the estimated right ventricular systolic pressure is 19.5 mmHg. Left Atrium: Left atrial  size was normal in size. Right Atrium: Right atrial size was normal in size. Pericardium: There is no evidence of pericardial effusion. Mitral Valve: The mitral valve is normal in structure. Trivial mitral valve regurgitation. MV peak gradient, 7.2 mmHg. The mean mitral valve gradient is 4.0 mmHg. Tricuspid Valve: The tricuspid valve is normal in structure. Tricuspid valve  regurgitation is trivial. Aortic Valve: The aortic valve is tricuspid. Aortic valve regurgitation is not visualized. Aortic valve sclerosis is present, with no evidence of aortic valve stenosis. Aortic valve mean gradient measures 8.0 mmHg. Aortic valve peak gradient measures 16.6 mmHg. Aortic valve area, by VTI measures 2.25 cm. Pulmonic Valve: The pulmonic valve was normal in structure. Pulmonic valve regurgitation is not visualized. Aorta: The aortic root is normal in size and structure. IAS/Shunts: No atrial level shunt detected by color flow Doppler.  LEFT VENTRICLE PLAX 2D LVIDd:         5.20 cm     Diastology LVIDs:         3.20 cm     LV e' medial:    13.40 cm/s LV PW:         1.10 cm     LV E/e' medial:  8.0 LV IVS:        1.00 cm     LV e' lateral:   16.80 cm/s LVOT diam:     2.00 cm     LV E/e' lateral: 6.4 LV SV:         100 LV SV Index:   43 LVOT Area:     3.14 cm  LV Volumes (MOD) LV vol d, MOD A2C: 75.0 ml LV vol d, MOD A4C: 69.1 ml LV vol s, MOD A2C: 32.3 ml LV vol s, MOD A4C: 27.0 ml LV SV MOD A2C:     42.7 ml LV SV MOD A4C:     69.1 ml LV SV MOD BP:      42.2 ml RIGHT VENTRICLE RV Basal diam:  3.30 cm RV Mid diam:    4.00 cm RV S prime:     19.20 cm/s TAPSE (M-mode): 3.1 cm LEFT ATRIUM             Index        RIGHT ATRIUM           Index LA diam:        4.50 cm 1.94 cm/m   RA Area:     17.50 cm LA Vol (A2C):   48.1 ml 20.76 ml/m  RA Volume:   41.20 ml  17.79 ml/m LA Vol (A4C):   92.3 ml 39.84 ml/m LA Biplane Vol: 71.3 ml 30.78 ml/m  AORTIC VALVE                     PULMONIC VALVE AV Area (Vmax):    2.39 cm      PV Vmax:       1.21 m/s AV Area (Vmean):   2.65 cm      PV Peak grad:  5.9 mmHg AV Area (VTI):     2.25 cm AV Vmax:           204.00 cm/s AV Vmean:          128.000 cm/s AV VTI:            0.444 m AV Peak Grad:      16.6 mmHg AV Mean Grad:      8.0 mmHg LVOT Vmax:  155.00 cm/s LVOT Vmean:        108.000 cm/s LVOT VTI:          0.318 m LVOT/AV VTI ratio: 0.72  AORTA Ao Root  diam: 2.80 cm MITRAL VALVE                TRICUSPID VALVE MV Area (PHT): 3.13 cm     TR Peak grad:   24.6 mmHg MV Area VTI:   3.86 cm     TR Vmax:        248.00 cm/s MV Peak grad:  7.2 mmHg MV Mean grad:  4.0 mmHg     SHUNTS MV Vmax:       1.34 m/s     Systemic VTI:  0.32 m MV Vmean:      88.2 cm/s    Systemic Diam: 2.00 cm MV Decel Time: 242 msec MV E velocity: 107.00 cm/s MV A velocity: 89.40 cm/s MV E/A ratio:  1.20 Dorris Carnes MD Electronically signed by Dorris Carnes MD Signature Date/Time: 12/16/2021/3:38:01 PM    Final    US LIVER DOPPLER  Result Date: 12/21/2021 CLINICAL DATA:  56 year old female with cirrhosis EXAM: DUPLEX ULTRASOUND OF LIVER TECHNIQUE: Color and duplex Doppler ultrasound was performed to evaluate the hepatic in-flow and out-flow vessels. COMPARISON:  None. FINDINGS: Portal Vein Velocities Main:  58 cm/sec Right:  57 cm/sec Left: Not visualized Hepatic Vein Velocities Right:  34 cm/sec Middle: Not visualized Left:  60 cm/sec Hepatic Artery Velocity:  78 cm/sec Splenic Vein Velocity:  59 cm/sec Varices: Present Ascites: Present Recanalized umbilical vein. Cirrhotic morphology of the liver IMPRESSION: Cirrhotic morphology of the liver with stigmata of portal hypertension including recanalized umbilical vein, additional varices, and ascites Signed, Dulcy Fanny. Dellia Nims, RPVI [facility] Radiology Electronically Signed   By: Corrie Mckusick D.O.   On: 12/21/2021 12:23   CT EXTREM LOWER W CM BIL  Result Date: 12/16/2021 CLINICAL DATA:  Soft tissue infection of the lower extremities bilaterally EXAM: CT OF THE LOWER BILATERAL EXTREMITY WITH CONTRAST TECHNIQUE: Multidetector CT imaging of the lower bilateral extremity was performed with axial images obtained from the acetabular roofs through the proximal tibial metadiaphysis following intravenous contrast administration. COMPARISON:  None. CONTRAST:  149mL OMNIPAQUE IOHEXOL 300 MG/ML  SOLN  FINDINGS: Bones/Joint/Cartilage Normal alignment. No acute fracture or dislocation. No osseous erosion or abnormal periosteal reaction. 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 Ligaments Suboptimally assessed by CT. Muscles and Tendons Unremarkable Soft tissues There is marked diffuse subcutaneous edema involving the lower extremities bilaterally circumferentially. Edematous changes extend into the inter fascial planes of the anterior, posterior, and medial muscular compartments of the thighs bilaterally. No discrete drainable fluid collection identified. Small bilateral knee effusions are present. Extensive subcutaneous edema is noted within the visualized pannus bilaterally. Large volume ascites is present within the visualized pelvis. IMPRESSION: Extensive subcutaneous edema involving the lower extremities bilaterally and pannus. Infiltrative changes extend into the muscular compartments of the thighs bilaterally. No discrete drainable fluid collection is identified, however. Small bilateral knee effusions. Large volume ascites. Electronically Signed   By: Fidela Salisbury M.D.   On: 12/16/2021 02:12   [place] EKG SITE RITE  Result Date: 12/16/2021 If Site Rite image not attached, placement could not be confirmed due to current cardiac rhythm.   Microbiology: Recent Results (from the past 240 hour(s))  MRSA Next Gen by PCR, Nasal     Status: Abnormal  Collection Time: 12/18/21  8:00 AM   Specimen: Nasal Mucosa; Nasal Swab  Result Value Ref Range Status   MRSA by PCR Next Gen DETECTED (A) NOT DETECTED Final    Comment: RESULT CALLED TO, READ BACK BY AND VERIFIED WITH: SMITH @ 1026 ON [id number] BY HENDERSON L (NOTE) The GeneXpert MRSA Assay (FDA approved for NASAL specimens only), is one component of a comprehensive MRSA colonization surveillance program. It is not intended to diagnose MRSA infection nor to guide or monitor treatment for MRSA infections. Test  performance is not FDA approved in patients less than 55 years old. Performed at [facility], [address], [place], [place] [address]   Acid Fast Smear (AFB)     Status: None   Collection Time: 12/21/21 12:20 PM   Specimen: Peritoneal Cavity; Body Fluid  Result Value Ref Range Status   AFB Specimen Processing Concentration  Final   Acid Fast Smear Negative  Final    Comment: (NOTE) Performed At: [facility] [address] [place], [place] [id number] Rush Farmer MD TK:[id number]    Source (AFB) PERITONEAL  Final    Comment: Performed at [facility], [address], [place], [place] [address]  Gram stain     Status: None   Collection Time: 12/21/21 12:20 PM   Specimen: Peritoneal Washings  Result Value Ref Range Status   Specimen Description PERITONEAL  Final   Special Requests NONE  Final   Gram Stain   Final    NO ORGANISMS SEEN NO WBC SEEN CYTOSPIN SMEAR Performed at [facility], [address] [place], [place] [address]    Report Status 12/21/2021 FINAL  Final  Culture, body fluid w Gram Stain-bottle     Status: None   Collection Time: 12/21/21 12:20 PM   Specimen: Peritoneal Washings  Result Value Ref Range Status   Specimen Description PERITONEAL  Final   Special Requests   Final    BOTTLES DRAWN AEROBIC AND ANAEROBIC Blood Culture results may not be optimal due to an excessive volume of blood received in culture bottles   Culture   Final    NO GROWTH 5 DAYS Performed at [facility], [address], [place], [place] [address]    Report Status 12/26/2021 FINAL  Final     Labs: Basic Metabolic Panel: Recent Labs  Lab 12/21/21 1021 12/22/21 0524 12/23/21 0532 12/24/21 0456 12/25/21 0750  NA 135 137 138 139 138  K 3.4* 3.4* 3.3* 3.0* 3.4*  CL 103 106 106 106 105  CO2 $Re'24 25 23 24 23  'mPc$ GLUCOSE 116* 75 75 83 99  BUN $Re'14 11 11 10 11  'cDP$ CREATININE 0.61 0.56 0.71 0.73 0.83  CALCIUM 8.0* 8.2* 8.4* 8.4* 7.9*    Liver Function Tests: Recent Labs  Lab  12/21/21 1021 12/22/21 0524 12/23/21 0532 12/24/21 0456  AST 38 $Remo'28 24 22  'OvXDi$ ALT $Rem'15 11 10 10  'rKrS$ ALKPHOS 101 61 52 51  BILITOT 1.9* 1.9* 1.6* 2.1*  PROT 4.8* 4.6* 5.1* 5.0*  ALBUMIN 2.3* 3.0* 3.9 3.6    No results for input(s): LIPASE, AMYLASE in the last 168 hours. No results for input(s): AMMONIA in the last 168 hours. CBC: Recent Labs  Lab 12/20/21 0424 12/21/21 1021 12/23/21 0532 12/23/21 0847 12/24/21 0456  WBC 8.0 9.1 2.9* 3.2* 3.5*  NEUTROABS  --  6.4  --   --   --   HGB 10.4* 10.9* 8.0* 8.3* 8.5*  HCT 31.8* 33.1* 24.7* 25.6* 26.4*  MCV 90.9  92.7 95.0 94.1 93.6  PLT 116* 124* 70* 78* 77*    Cardiac Enzymes: No results for input(s): CKTOTAL, CKMB, CKMBINDEX, TROPONINI in the last 168 hours. BNP: Invalid input(s): POCBNP CBG: No results for input(s): GLUCAP in the last 168 hours.  Time coordinating discharge:  36 minutes  Signed:  Orson Eva, DO [facility] Pager: [phone] 12/26/2021, 5:24 PM

## 2021-12-26 NOTE — Progress Notes (Signed)
Physical Therapy Treatment Patient Details Name: Kristie Brooks MRN: [id number] DOB: 1966-11-26 Today's Date: 12/26/2021   History of Present Illness Kristie Brooks  is a 56 y.o. female, with history of cirrhosis, presents ED for chief complaint of what she says is fluid overload.  What was reported by EMS states that she was sent in for fever and confusion.  Does report that her husband said she was confused that she has been thinks that she was.  She reports that she had fluid overload in her legs and thighs for 3 or 4 days.  She reports they have been weeping for 3 or 4 days.  She is also had erythema for 3 or 4 days.  She reports that all the symptoms started the same time.    PT Comments    Patient requires HOB fully raised during bed  mobility demonstrating slow labored movement and frequent rest breaks, requested bed elevated slightly for completing sit to stands using RW with Min assist and limited to a few slow labored unsteady side steps before having to sit due due to c/o fatigue.  Patient tolerated sitting up in chair after therapy - nursing staff notified.  Patient will benefit from continued skilled physical therapy in hospital and recommended venue below to increase strength, balance, endurance for safe ADLs and gait.     Recommendations for follow up therapy are one component of a multi-disciplinary discharge planning process, led by the attending physician.  Recommendations may be updated based on patient status, additional functional criteria and insurance authorization.  Follow Up Recommendations  Skilled nursing-short term rehab (<3 hours/day)     Assistance Recommended at Discharge Intermittent Supervision/Assistance  Equipment Recommendations  None recommended by PT    Recommendations for Other Services       Precautions / Restrictions Precautions Precautions: Fall Restrictions Weight Bearing Restrictions: No     Mobility  Bed Mobility Overal bed mobility:  Needs Assistance Bed Mobility: Supine to Sit     Supine to sit: HOB elevated;Min assist     General bed mobility comments: increased time, labored movemet, required HOB fully raised    Transfers Overall transfer level: Needs assistance Equipment used: Rolling walker (2 wheels) Transfers: Sit to/from Stand Sit to Stand: Min assist;From elevated surface     Step pivot transfers: From elevated surface;Mod assist     General transfer comment: required bed elevated to complete sit to stand from bed side due to BLE weakness    Ambulation/Gait Ambulation/Gait assistance: Mod assist Gait Distance (Feet): 5 Feet Assistive device: Rolling walker (2 wheels) Gait Pattern/deviations: Decreased step length - left;Decreased step length - right;Decreased stride length;Trunk flexed Gait velocity: decreased     General Gait Details: limited to a few slow labored unsteady side steps due to fatigue and c/o generalized weakness   Stairs             Wheelchair Mobility    Modified Rankin (Stroke Patients Only)       Balance Overall balance assessment: Needs assistance Sitting-balance support: Feet supported;No upper extremity supported Sitting balance-Leahy Scale: Good Sitting balance - Comments: seated EOB   Standing balance support: Reliant on assistive device for balance;During functional activity;Bilateral upper extremity supported Standing balance-Leahy Scale: Fair Standing balance comment: using RW                            Cognition Arousal/Alertness: Awake/alert Behavior During Therapy: WFL for tasks assessed/performed Overall Cognitive  Status: Within Functional Limits for tasks assessed                                          Exercises General Exercises - Lower Extremity Long Arc Quad: Seated;AROM;Strengthening;Both;10 reps Hip Flexion/Marching: Seated;AROM;Strengthening;Both;10 reps Toe Raises: Seated;AROM;Strengthening;Both;10  reps Heel Raises: Seated;AROM;Strengthening;Both;10 reps    General Comments        Pertinent Vitals/Pain Pain Assessment: No/denies pain    Home Living                          Prior Function            PT Goals (current goals can now be found in the care plan section) Acute Rehab PT Goals Patient Stated Goal: Return home when able PT Goal Formulation: With patient Time For Goal Achievement: 12/27/21 Potential to Achieve Goals: Good Progress towards PT goals: Progressing toward goals    Frequency    Min 3X/week      PT Plan Current plan remains appropriate    Co-evaluation              AM-PAC PT "6 Clicks" Mobility   Outcome Measure  Help needed turning from your back to your side while in a flat bed without using bedrails?: None Help needed moving from lying on your back to sitting on the side of a flat bed without using bedrails?: A Lot Help needed moving to and from a bed to a chair (including a wheelchair)?: A Lot Help needed standing up from a chair using your arms (e.g., wheelchair or bedside chair)?: A Lot Help needed to walk in hospital room?: A Lot Help needed climbing 3-5 steps with a railing? : A Lot 6 Click Score: 14    End of Session Equipment Utilized During Treatment: Oxygen Activity Tolerance: Patient tolerated treatment well;Patient limited by fatigue Patient left: in chair;with call bell/phone within reach Nurse Communication: Mobility status PT Visit Diagnosis: Unsteadiness on feet (R26.81);Muscle weakness (generalized) (M62.81);Difficulty in walking, not elsewhere classified (R26.2)     Time: 9798-9211 PT Time Calculation (min) (ACUTE ONLY): 21 min  Charges:  $Therapeutic Exercise: 8-22 mins $Therapeutic Activity: 8-22 mins                     12:21 PM, 12/26/21 Lonell Grandchild, MPT Physical Therapist with [facility] [phone] office [phone] mobile phone

## 2021-12-29 ENCOUNTER — Encounter (HOSPITAL_COMMUNITY): Payer: Self-pay | Admitting: Internal Medicine

## 2022-01-04 ENCOUNTER — Encounter (HOSPITAL_COMMUNITY): Payer: Self-pay

## 2022-01-04 ENCOUNTER — Other Ambulatory Visit: Payer: Self-pay

## 2022-01-04 ENCOUNTER — Observation Stay (HOSPITAL_COMMUNITY)
Admission: EM | Admit: 2022-01-04 | Discharge: 2022-01-06 | Disposition: A | Discharge status: Skilled Nursing Facility | Payer: Managed Care, Other (non HMO) | Attending: Internal Medicine | Admitting: Internal Medicine

## 2022-01-04 DIAGNOSIS — K7031 Alcoholic cirrhosis of liver with ascites: Secondary | ICD-10-CM

## 2022-01-04 DIAGNOSIS — I959 Hypotension, unspecified: Secondary | ICD-10-CM | POA: Diagnosis not present

## 2022-01-04 DIAGNOSIS — L8990 Pressure ulcer of unspecified site, unspecified stage: Secondary | ICD-10-CM | POA: Diagnosis present

## 2022-01-04 DIAGNOSIS — E669 Obesity, unspecified: Secondary | ICD-10-CM | POA: Insufficient documentation

## 2022-01-04 DIAGNOSIS — J9611 Chronic respiratory failure with hypoxia: Secondary | ICD-10-CM | POA: Diagnosis not present

## 2022-01-04 DIAGNOSIS — I82621 Acute embolism and thrombosis of deep veins of right upper extremity: Principal | ICD-10-CM | POA: Insufficient documentation

## 2022-01-04 DIAGNOSIS — M7989 Other specified soft tissue disorders: Secondary | ICD-10-CM | POA: Diagnosis present

## 2022-01-04 DIAGNOSIS — I82409 Acute embolism and thrombosis of unspecified deep veins of unspecified lower extremity: Secondary | ICD-10-CM | POA: Diagnosis present

## 2022-01-04 DIAGNOSIS — E039 Hypothyroidism, unspecified: Secondary | ICD-10-CM | POA: Diagnosis present

## 2022-01-04 DIAGNOSIS — Z20822 Contact with and (suspected) exposure to covid-19: Secondary | ICD-10-CM | POA: Insufficient documentation

## 2022-01-04 DIAGNOSIS — E876 Hypokalemia: Secondary | ICD-10-CM | POA: Diagnosis not present

## 2022-01-04 DIAGNOSIS — Z79899 Other long term (current) drug therapy: Secondary | ICD-10-CM | POA: Diagnosis not present

## 2022-01-04 DIAGNOSIS — K219 Gastro-esophageal reflux disease without esophagitis: Secondary | ICD-10-CM | POA: Diagnosis present

## 2022-01-04 DIAGNOSIS — E079 Disorder of thyroid, unspecified: Secondary | ICD-10-CM | POA: Diagnosis present

## 2022-01-04 HISTORY — DX: Gastro-esophageal reflux disease without esophagitis: K21.9

## 2022-01-04 HISTORY — DX: Pneumonia, unspecified organism: J18.9

## 2022-01-04 HISTORY — DX: Unspecified osteoarthritis, unspecified site: M19.90

## 2022-01-04 HISTORY — DX: Peripheral vascular disease, unspecified: I73.9

## 2022-01-04 HISTORY — DX: Thyrotoxicosis, unspecified without thyrotoxic crisis or storm: E05.90

## 2022-01-04 LAB — CBC WITH DIFFERENTIAL/PLATELET
Abs Immature Granulocytes: 0.01 10*3/uL (ref 0.00–0.07)
Basophils Absolute: 0 10*3/uL (ref 0.0–0.1)
Basophils Relative: 1 %
Eosinophils Absolute: 0.1 10*3/uL (ref 0.0–0.5)
Eosinophils Relative: 3 %
HCT: 32.5 % — ABNORMAL LOW (ref 36.0–46.0)
Hemoglobin: 10.8 g/dL — ABNORMAL LOW (ref 12.0–15.0)
Immature Granulocytes: 0 %
Lymphocytes Relative: 22 %
Lymphs Abs: 0.8 10*3/uL (ref 0.7–4.0)
MCH: 30.6 pg (ref 26.0–34.0)
MCHC: 33.2 g/dL (ref 30.0–36.0)
MCV: 92.1 fL (ref 80.0–100.0)
Monocytes Absolute: 0.4 10*3/uL (ref 0.1–1.0)
Monocytes Relative: 11 %
Neutro Abs: 2.4 10*3/uL (ref 1.7–7.7)
Neutrophils Relative %: 63 %
Platelets: 114 10*3/uL — ABNORMAL LOW (ref 150–400)
RBC: 3.53 MIL/uL — ABNORMAL LOW (ref 3.87–5.11)
RDW: 19 % — ABNORMAL HIGH (ref 11.5–15.5)
WBC: 3.8 10*3/uL — ABNORMAL LOW (ref 4.0–10.5)
nRBC: 0 % (ref 0.0–0.2)

## 2022-01-04 LAB — RESP PANEL BY RT-PCR (FLU A&B, COVID) ARPGX2
Influenza A by PCR: NEGATIVE
Influenza B by PCR: NEGATIVE
SARS Coronavirus 2 by RT PCR: NEGATIVE

## 2022-01-04 LAB — COMPREHENSIVE METABOLIC PANEL
ALT: 10 U/L (ref 0–44)
AST: 32 U/L (ref 15–41)
Albumin: 3.2 g/dL — ABNORMAL LOW (ref 3.5–5.0)
Alkaline Phosphatase: 101 U/L (ref 38–126)
Anion gap: 12 (ref 5–15)
BUN: 13 mg/dL (ref 6–20)
CO2: 28 mmol/L (ref 22–32)
Calcium: 8.2 mg/dL — ABNORMAL LOW (ref 8.9–10.3)
Chloride: 99 mmol/L (ref 98–111)
Creatinine, Ser: 0.94 mg/dL (ref 0.44–1.00)
GFR, Estimated: >60 mL/min (ref >60)
Glucose, Bld: 85 mg/dL (ref 70–99)
Potassium: 3.1 mmol/L — ABNORMAL LOW (ref 3.5–5.1)
Sodium: 139 mmol/L (ref 135–145)
Total Bilirubin: 2.3 mg/dL — ABNORMAL HIGH (ref 0.3–1.2)
Total Protein: 5.7 g/dL — ABNORMAL LOW (ref 6.5–8.1)

## 2022-01-04 LAB — PROTIME-INR
INR: 1.5 — ABNORMAL HIGH (ref 0.8–1.2)
Prothrombin Time: 17.7 seconds — ABNORMAL HIGH (ref 11.4–15.2)

## 2022-01-04 MED ORDER — LACTULOSE 10 GM/15ML PO SOLN
10.0000 g | Freq: Every day | ORAL | Status: DC
  Administered 2022-01-06: 10 g via ORAL
  Filled 2022-01-04: qty 30

## 2022-01-04 MED ORDER — ONDANSETRON HCL 4 MG PO TABS: 4.0000 mg | ORAL_TABLET | Freq: Four times a day (QID) | ORAL | Status: DC | PRN

## 2022-01-04 MED ORDER — ACETAMINOPHEN 325 MG PO TABS: 650.0000 mg | ORAL_TABLET | Freq: Four times a day (QID) | ORAL | Status: DC | PRN

## 2022-01-04 MED ORDER — CALCIUM CARBONATE ANTACID 500 MG PO CHEW
1.0000 | CHEWABLE_TABLET | Freq: Every day | ORAL | Status: DC
  Administered 2022-01-05 – 2022-01-06 (×2): 200 mg via ORAL
  Filled 2022-01-04 (×2): qty 1

## 2022-01-04 MED ORDER — OXYCODONE HCL 5 MG PO TABS
5.0000 mg | ORAL_TABLET | ORAL | Status: DC | PRN
  Administered 2022-01-05 – 2022-01-06 (×4): 5 mg via ORAL
  Filled 2022-01-04 (×4): qty 1

## 2022-01-04 MED ORDER — HEPARIN BOLUS VIA INFUSION
4000.0000 [IU] | Freq: Once | INTRAVENOUS | Status: AC
  Administered 2022-01-04: 4000 [IU] via INTRAVENOUS

## 2022-01-04 MED ORDER — VITAMIN B-12 100 MCG PO TABS
250.0000 ug | ORAL_TABLET | Freq: Every day | ORAL | Status: DC
  Administered 2022-01-05 – 2022-01-06 (×2): 250 ug via ORAL
  Filled 2022-01-04 (×2): qty 3

## 2022-01-04 MED ORDER — MIDODRINE HCL 5 MG PO TABS
10.0000 mg | ORAL_TABLET | Freq: Three times a day (TID) | ORAL | Status: DC
  Administered 2022-01-05 – 2022-01-06 (×6): 10 mg via ORAL
  Filled 2022-01-04 (×6): qty 2

## 2022-01-04 MED ORDER — FOLIC ACID 1 MG PO TABS
1.0000 mg | ORAL_TABLET | Freq: Every day | ORAL | Status: DC
  Administered 2022-01-05 – 2022-01-06 (×2): 1 mg via ORAL
  Filled 2022-01-04 (×2): qty 1

## 2022-01-04 MED ORDER — ONDANSETRON HCL 4 MG/2ML IJ SOLN: 4.0000 mg | Freq: Four times a day (QID) | INTRAMUSCULAR | Status: DC | PRN

## 2022-01-04 MED ORDER — POTASSIUM CHLORIDE 20 MEQ PO PACK: 40.0000 meq | PACK | Freq: Once | ORAL | Status: DC

## 2022-01-04 MED ORDER — ACETAMINOPHEN 650 MG RE SUPP: 650.0000 mg | Freq: Four times a day (QID) | RECTAL | Status: DC | PRN

## 2022-01-04 MED ORDER — HEPARIN (PORCINE) 25000 UT/250ML-% IV SOLN
1550.0000 [IU]/h | INTRAVENOUS | Status: DC
  Administered 2022-01-04: 1300 [IU]/h via INTRAVENOUS
  Filled 2022-01-04 (×2): qty 250

## 2022-01-04 MED ORDER — PANTOPRAZOLE SODIUM 40 MG PO TBEC
40.0000 mg | DELAYED_RELEASE_TABLET | Freq: Two times a day (BID) | ORAL | Status: DC
  Administered 2022-01-05 – 2022-01-06 (×4): 40 mg via ORAL
  Filled 2022-01-04 (×4): qty 1

## 2022-01-04 MED ORDER — LEVOTHYROXINE SODIUM 50 MCG PO TABS
50.0000 ug | ORAL_TABLET | Freq: Every day | ORAL | Status: DC
  Administered 2022-01-05 – 2022-01-06 (×2): 50 ug via ORAL
  Filled 2022-01-04 (×2): qty 1

## 2022-01-04 MED ORDER — THIAMINE HCL 100 MG PO TABS
250.0000 mg | ORAL_TABLET | Freq: Every day | ORAL | Status: DC
  Administered 2022-01-05 – 2022-01-06 (×2): 250 mg via ORAL
  Filled 2022-01-04 (×2): qty 3

## 2022-01-04 NOTE — ED Provider Notes (Signed)
[facility] EMERGENCY DEPARTMENT Provider Note   CSN: [id number] Arrival date & time: 01/04/22  1555     History  Chief Complaint  Patient presents with   DVT    Kristie Brooks is a 56 y.o. female.  HPI      Kristie Brooks is a 56 y.o. female with past medical history significant for liver cirrhosis with ascites, anasarca and prior hospital admission for sepsis in late December.  She currently resides at [facility].  She was sent here for further evaluation of DVT of the right internal jugular, subclavian and axillary and basilic veins.  Patient had PICC line placed in late December during her hospitalization.  She was discharged on 12/26/2021 to the [facility].  She reports having some discomfort swelling and redness of her right arm during her hospital course.  Since her discharge to the [facility], swelling and pain of the arm has resolved.  She had ultrasound imaging of the extremity earlier today and was sent here for further evaluation of her DVT results. She denies any chest pain, shortness of breath numbness or tingling of the extremity.  No neck pain.  She does not currently take any anticoagulants. She is on oxygen at 2L [place] at baseline and prior to her recent hospitalization   Home Medications Prior to Admission medications   Medication Sig Start Date End Date Taking? Authorizing Provider  acetaminophen (TYLENOL) 325 MG tablet Take 650 mg by mouth every 6 (six) hours as needed for moderate pain.    [provider]  Ascorbic Acid (VITAMIN C) 1000 MG tablet Take 1,000 mg by mouth daily.    [provider]  calcium carbonate (TUMS - DOSED IN MG ELEMENTAL CALCIUM) 500 MG chewable tablet Chew 1 tablet by mouth daily.    [provider]  clobetasol ointment (TEMOVATE) 8.24 % Apply 1 application topically 2 (two) times daily.    [provider]  fexofenadine (ALLEGRA) 180 MG tablet Take by mouth.    [provider]   fish oil-omega-3 fatty acids 1000 MG capsule Take 1 g by mouth daily.    [provider]  folic acid (FOLVITE) 1 MG tablet Take 1 mg by mouth daily.    [provider]  lactulose (CHRONULAC) 10 GM/15ML solution Take 15 mLs (10 g total) by mouth daily. 12/24/21   Orson Eva, MD  levothyroxine (SYNTHROID) 50 MCG tablet Take 1 tablet (50 mcg total) by mouth daily before breakfast. 12/24/21   Tat, Shanon Brow, MD  magnesium oxide (MAG-OX) 400 MG tablet Take 400 mg by mouth daily.    [provider]  midodrine (PROAMATINE) 10 MG tablet Take 1 tablet (10 mg total) by mouth 3 (three) times daily with meals. 12/24/21   Orson Eva, MD  oxyCODONE (OXY IR/ROXICODONE) 5 MG immediate release tablet Take 5-10 mg by mouth every 4 (four) hours as needed for severe pain.    [provider]  pantoprazole (PROTONIX) 40 MG tablet Take 1 tablet (40 mg total) by mouth 2 (two) times daily before a meal. 12/24/21   Tat, Shanon Brow, MD  potassium chloride (KLOR-CON M) 10 MEQ tablet Take 10 mEq by mouth daily.    [provider]  sulfamethoxazole-trimethoprim (BACTRIM DS) 800-160 MG tablet Take 1 tablet by mouth every 12 (twelve) hours. X 3 days 12/24/21   TatShanon Brow, MD  thiamine 250 MG tablet Take 250 mg by mouth daily.    [provider]  torsemide [facility])  20 MG tablet Take 20 mg by mouth daily.    [provider]  vitamin B-12 (CYANOCOBALAMIN) 250 MCG tablet Take 250 mcg by mouth daily.    [provider]  Vitamin D, Ergocalciferol, (DRISDOL) 1.25 MG (50000 UNIT) CAPS capsule Take 50,000 Units by mouth every 7 (seven) days. Sundays    [provider]  vitamin E 200 UNIT capsule Take 400 Units by mouth daily.    [provider]      Allergies    Dakin's [sodium hypochlorite] and Zosyn [piperacillin sod-tazobactam so]    Review of Systems   Review of Systems  Constitutional:  Negative for chills and fever.  Respiratory:  Negative for  chest tightness and shortness of breath.   Cardiovascular:  Negative for chest pain.  Gastrointestinal:  Negative for abdominal pain, nausea and vomiting.  Musculoskeletal:  Positive for myalgias (Pain of right arm).  Skin:  Negative for color change and wound.  Neurological:  Negative for dizziness, weakness and numbness.  All other systems reviewed and are negative.  Physical Exam Updated Vital Signs BP (!) 99/49    Pulse 72    Resp 18    Ht 5\' 5"  (1.651 m)    Wt 104.3 kg    SpO2 97%    BMI 38.27 kg/m  Physical Exam Vitals and nursing note reviewed.  Constitutional:      Appearance: Normal appearance. She is not ill-appearing.  Cardiovascular:     Rate and Rhythm: Normal rate and regular rhythm.     Pulses: Normal pulses.  Pulmonary:     Effort: Pulmonary effort is normal.     Breath sounds: Normal breath sounds.  Chest:     Chest wall: No tenderness.  Musculoskeletal:        General: No swelling, tenderness or signs of injury.     Cervical back: Normal range of motion. No tenderness.     Comments: Patient has full range of motion of the right upper extremity, no erythema, edema or excessive warmth noted.  Palpable radial pulse, cap refill of the right extremity less than 2 seconds.  Lymphadenopathy:     Cervical: No cervical adenopathy.  Skin:    General: Skin is warm.     Capillary Refill: Capillary refill takes less than 2 seconds.     Findings: No bruising, erythema or rash.  Neurological:     General: No focal deficit present.     Mental Status: She is alert.     Sensory: No sensory deficit.     Motor: No weakness.    ED Results / Procedures / Treatments   Labs (all labs ordered are listed, but only abnormal results are displayed) Labs Reviewed  PROTIME-INR - Abnormal; Notable for the following components:      Result Value   Prothrombin Time 17.7 (*)    INR 1.5 (*)    All other components within normal limits  COMPREHENSIVE METABOLIC PANEL - Abnormal; Notable  for the following components:   Potassium 3.1 (*)    Calcium 8.2 (*)    Total Protein 5.7 (*)    Albumin 3.2 (*)    Total Bilirubin 2.3 (*)    All other components within normal limits  CBC WITH DIFFERENTIAL/PLATELET - Abnormal; Notable for the following components:   WBC 3.8 (*)    RBC 3.53 (*)    Hemoglobin 10.8 (*)    HCT 32.5 (*)    RDW 19.0 (*)    Platelets 114 (*)  All other components within normal limits  CBC WITH DIFFERENTIAL/PLATELET  HEPARIN LEVEL (UNFRACTIONATED)  HEPARIN LEVEL (UNFRACTIONATED)  CBC    EKG None  Radiology No results found.       Procedures Procedures    Medications Ordered in ED Medications  heparin ADULT infusion 100 units/mL (25000 units/29mL) (1,300 Units/hr Intravenous New Bag/Given 01/04/22 1937)  heparin bolus via infusion 4,000 Units (4,000 Units Intravenous Bolus from Bag 01/04/22 1937)    ED Course/ Medical Decision Making/ A&P                           Medical Decision Making  Patient here from [facility] for evaluation of DVT of the right internal jugular subclavian and axillary basilic veins.  Symptoms began after having PICC line placed in the right upper arm.  No history of prior DVTs, patient is not currently anticoagulated.  On exam, there is some slight tenderness to the medial aspect of the right upper arm without evidence of erythema, edema or excessive warmth.  No tenderness of the neck.  Patient has good cap refill distally and radial pulses palpable.  Patient does not appear critically ill, ultrasound imaging results unavailable in epic, patient has hard copy of venous Doppler imaging results.  Labs today interpreted by me, mildly leukopenic with white count of 3.8.  This is baseline hemoglobin improved from baseline at 10.8 platelets 114, this is also improved.  No significant electrolyte derangement.  Patient does have total bilirubin of 2.3 etiology likely related to patient's cirrhosis.  Discussed  findings with ED provider, Dr. Doren Custard.  Patient will be started on heparin and will likely need hospital admit for further anticoagulation.  Discussed findings with [facility] hospitalist, Dr. Clearence Ped who agrees to admit.          Final Clinical Impression(s) / ED Diagnoses Final diagnoses:  Acute deep vein thrombosis (DVT) of other vein of right upper extremity [facility])    Rx / DC Orders ED Discharge Orders     None         Bufford Lope 01/04/22 2027    Godfrey Pick, MD 01/05/22 [phone]

## 2022-01-04 NOTE — ED Notes (Signed)
Unable to obtain IV access to start Heparin Drip, MD made aware

## 2022-01-04 NOTE — Progress Notes (Signed)
ANTICOAGULATION CONSULT NOTE - Initial Consult  Pharmacy Consult for heparin gtt  Indication: DVT  Allergies  Allergen Reactions   Dakin's [Sodium Hypochlorite] Dermatitis   Zosyn [Piperacillin Sod-Tazobactam So] Other (See Comments)    Blood count dropped    Patient Measurements: Height: 5\' 5"  (165.1 cm) Weight: 104.3 kg (230 lb) IBW/kg (Calculated) : 57 Heparin Dosing Weight: HEPARIN DW (KG): 81.2   Vital Signs:    Labs: No results for input(s): HGB, HCT, PLT, APTT, LABPROT, INR, HEPARINUNFRC, HEPRLOWMOCWT, CREATININE, CKTOTAL, CKMB, TROPONINI in the last 72 hours.  Estimated Creatinine Clearance: 90.7 mL/min (by C-G formula based on SCr of 0.83 mg/dL).   Medical History: Past Medical History:  Diagnosis Date   Basal cell carcinoma 05/18/2021   nod-mid forehead ([facility])    Medications:  (Not in a hospital admission)  Scheduled:   heparin  4,000 Units Intravenous Once   Infusions:   heparin     PRN:  Anti-infectives (From admission, onward)    None       Assessment: Kristie Brooks a 56 y.o. female requires anticoagulation with a heparin iv infusion for the indication of  DVT. Heparin gtt will be started following pharmacy protocol per pharmacy consult. Patient is not on previous oral anticoagulant that will require aPTT/HL correlation before transitioning to only HL monitoring.   Hgb 8.5 at start of heparin consult, will need to monitor closely   Goal of Therapy:  Heparin level 0.3-0.7 units/ml Monitor platelets by anticoagulation protocol: Yes   Plan:  Give 4000 units bolus x 1 Start heparin infusion at 1300 units/hr Check anti-Xa level in 6 hours and daily while on heparin Continue to monitor H&H and platelets   Donna Christen Loukas Antonson 01/04/2022,5:31 PM

## 2022-01-04 NOTE — ED Triage Notes (Signed)
Patient via EMS from [facility] for DVT in right upper arm confirmed by [place].

## 2022-01-04 NOTE — ED Notes (Signed)
Called ICU to see if a nurse could do an ultrasound IV; However, no one is certified to do an ultrasound IV in the ED or ICU at this time.

## 2022-01-05 ENCOUNTER — Encounter (HOSPITAL_COMMUNITY): Payer: Self-pay | Admitting: Family Medicine

## 2022-01-05 ENCOUNTER — Observation Stay (HOSPITAL_COMMUNITY): Payer: Managed Care, Other (non HMO)

## 2022-01-05 DIAGNOSIS — K7031 Alcoholic cirrhosis of liver with ascites: Secondary | ICD-10-CM

## 2022-01-05 DIAGNOSIS — E039 Hypothyroidism, unspecified: Secondary | ICD-10-CM | POA: Diagnosis present

## 2022-01-05 DIAGNOSIS — I82621 Acute embolism and thrombosis of deep veins of right upper extremity: Secondary | ICD-10-CM | POA: Diagnosis not present

## 2022-01-05 DIAGNOSIS — K219 Gastro-esophageal reflux disease without esophagitis: Secondary | ICD-10-CM

## 2022-01-05 DIAGNOSIS — I82A11 Acute embolism and thrombosis of right axillary vein: Secondary | ICD-10-CM

## 2022-01-05 DIAGNOSIS — E079 Disorder of thyroid, unspecified: Secondary | ICD-10-CM | POA: Diagnosis not present

## 2022-01-05 LAB — CBC WITH DIFFERENTIAL/PLATELET
Abs Immature Granulocytes: 0.02 10*3/uL (ref 0.00–0.07)
Basophils Absolute: 0 10*3/uL (ref 0.0–0.1)
Basophils Relative: 1 %
Eosinophils Absolute: 0.1 10*3/uL (ref 0.0–0.5)
Eosinophils Relative: 4 %
HCT: 28 % — ABNORMAL LOW (ref 36.0–46.0)
Hemoglobin: 9 g/dL — ABNORMAL LOW (ref 12.0–15.0)
Immature Granulocytes: 1 %
Lymphocytes Relative: 26 %
Lymphs Abs: 0.8 10*3/uL (ref 0.7–4.0)
MCH: 29.5 pg (ref 26.0–34.0)
MCHC: 32.1 g/dL (ref 30.0–36.0)
MCV: 91.8 fL (ref 80.0–100.0)
Monocytes Absolute: 0.3 10*3/uL (ref 0.1–1.0)
Monocytes Relative: 9 %
Neutro Abs: 1.9 10*3/uL (ref 1.7–7.7)
Neutrophils Relative %: 59 %
Platelets: 101 10*3/uL — ABNORMAL LOW (ref 150–400)
RBC: 3.05 MIL/uL — ABNORMAL LOW (ref 3.87–5.11)
RDW: 18.7 % — ABNORMAL HIGH (ref 11.5–15.5)
WBC: 3.3 10*3/uL — ABNORMAL LOW (ref 4.0–10.5)
nRBC: 0 % (ref 0.0–0.2)

## 2022-01-05 LAB — COMPREHENSIVE METABOLIC PANEL
ALT: 10 U/L (ref 0–44)
AST: 30 U/L (ref 15–41)
Albumin: 2.8 g/dL — ABNORMAL LOW (ref 3.5–5.0)
Alkaline Phosphatase: 87 U/L (ref 38–126)
Anion gap: 9 (ref 5–15)
BUN: 13 mg/dL (ref 6–20)
CO2: 28 mmol/L (ref 22–32)
Calcium: 7.8 mg/dL — ABNORMAL LOW (ref 8.9–10.3)
Chloride: 100 mmol/L (ref 98–111)
Creatinine, Ser: 0.9 mg/dL (ref 0.44–1.00)
GFR, Estimated: >60 mL/min (ref >60)
Glucose, Bld: 92 mg/dL (ref 70–99)
Potassium: 2.9 mmol/L — ABNORMAL LOW (ref 3.5–5.1)
Sodium: 137 mmol/L (ref 135–145)
Total Bilirubin: 1.8 mg/dL — ABNORMAL HIGH (ref 0.3–1.2)
Total Protein: 4.9 g/dL — ABNORMAL LOW (ref 6.5–8.1)

## 2022-01-05 LAB — HEPARIN LEVEL (UNFRACTIONATED)
Heparin Unfractionated: 0.12 IU/mL — ABNORMAL LOW (ref 0.30–0.70)
Heparin Unfractionated: 0.15 IU/mL — ABNORMAL LOW (ref 0.30–0.70)
Heparin Unfractionated: 0.34 IU/mL (ref 0.30–0.70)

## 2022-01-05 LAB — MAGNESIUM: Magnesium: 1.6 mg/dL — ABNORMAL LOW (ref 1.7–2.4)

## 2022-01-05 MED ORDER — POTASSIUM CHLORIDE CRYS ER 20 MEQ PO TBCR
40.0000 meq | EXTENDED_RELEASE_TABLET | Freq: Once | ORAL | Status: AC
  Administered 2022-01-05: 40 meq via ORAL
  Filled 2022-01-05: qty 2

## 2022-01-05 MED ORDER — HYDROCODONE-ACETAMINOPHEN 5-325 MG PO TABS
1.0000 | ORAL_TABLET | Freq: Once | ORAL | Status: AC
  Administered 2022-01-05: 1 via ORAL
  Filled 2022-01-05: qty 1

## 2022-01-05 MED ORDER — HEPARIN (PORCINE) 25000 UT/250ML-% IV SOLN: 1650.0000 [IU]/h | INTRAVENOUS | Status: DC

## 2022-01-05 MED ORDER — POTASSIUM CHLORIDE 10 MEQ/100ML IV SOLN
10.0000 meq | Freq: Once | INTRAVENOUS | Status: AC
  Administered 2022-01-05: 10 meq via INTRAVENOUS
  Filled 2022-01-05: qty 100

## 2022-01-05 MED ORDER — TORSEMIDE 20 MG PO TABS
20.0000 mg | ORAL_TABLET | Freq: Every day | ORAL | Status: DC
  Administered 2022-01-05 – 2022-01-06 (×2): 20 mg via ORAL
  Filled 2022-01-05 (×2): qty 1

## 2022-01-05 MED ORDER — HEPARIN BOLUS VIA INFUSION
2000.0000 [IU] | Freq: Once | INTRAVENOUS | Status: AC
  Administered 2022-01-05: 2000 [IU] via INTRAVENOUS
  Filled 2022-01-05: qty 2000

## 2022-01-05 NOTE — Procedures (Signed)
° ° °  US guided RLQ paracentesis  3 L yellow fluid No labs per MD  Tolerated well

## 2022-01-05 NOTE — Progress Notes (Signed)
LEFT LEG    LEFT LEG     RIGHT LEG

## 2022-01-05 NOTE — Progress Notes (Addendum)
ANTICOAGULATION CONSULT NOTE - Follow-up  Pharmacy Consult for heparin gtt  Indication: DVT  Allergies  Allergen Reactions   Dakin's [Sodium Hypochlorite] Dermatitis   Zosyn [Piperacillin Sod-Tazobactam So] Other (See Comments)    Blood count dropped    Patient Measurements: Height: 5\' 5"  (165.1 cm) Weight: 104.3 kg (230 lb) IBW/kg (Calculated) : 57 Heparin Dosing Weight: HEPARIN DW (KG): 81.2   Vital Signs: Temp: 98.4 F (36.9 C) (01/11 1345) Temp Source: Oral (01/11 1345) BP: 103/57 (01/11 1345) Pulse Rate: 85 (01/11 1345)  Labs: Recent Labs    01/04/22 1759 01/04/22 1900 01/05/22 0023 01/05/22 0548 01/05/22 0752 01/05/22 1659  HGB  --  10.8*  --  9.0*  --   --   HCT  --  32.5*  --  28.0*  --   --   PLT  --  114*  --  101*  --   --   LABPROT 17.7*  --   --   --   --   --   INR 1.5*  --   --   --   --   --   HEPARINUNFRC  --   --  0.15*  --  0.34 0.12*  CREATININE 0.94  --   --  0.90  --   --      Estimated Creatinine Clearance: 83.6 mL/min (by C-G formula based on SCr of 0.9 mg/dL).   Medical History: Past Medical History:  Diagnosis Date   Arthritis    Basal cell carcinoma 05/18/2021   nod-mid forehead ([facility])   GERD (gastroesophageal reflux disease)    Hyperthyroidism    Peripheral vascular disease (HCC)    Pneumonia     Medications:  Medications Prior to Admission  Medication Sig Dispense Refill Last Dose   acetaminophen (TYLENOL) 325 MG tablet Take 650 mg by mouth every 6 (six) hours as needed for moderate pain.   Past Week   Ascorbic Acid (VITAMIN C) 1000 MG tablet Take 1,000 mg by mouth daily.   01/03/2022   calcium carbonate (TUMS - DOSED IN MG ELEMENTAL CALCIUM) 500 MG chewable tablet Chew 1 tablet by mouth daily.   01/03/2022   fexofenadine (ALLEGRA) 180 MG tablet Take by mouth.   01/03/2022   fish oil-omega-3 fatty acids 1000 MG capsule Take 1 g by mouth daily.   04/28/6269   folic acid (FOLVITE) 1 MG tablet Take 1 mg by mouth daily.   01/03/2022    levothyroxine (SYNTHROID) 50 MCG tablet Take 1 tablet (50 mcg total) by mouth daily before breakfast. 30 tablet 1 01/03/2022   magnesium oxide (MAG-OX) 400 MG tablet Take 400 mg by mouth daily.   01/03/2022   midodrine (PROAMATINE) 10 MG tablet Take 1 tablet (10 mg total) by mouth 3 (three) times daily with meals.   01/03/2022   oxyCODONE (OXY IR/ROXICODONE) 5 MG immediate release tablet Take 5-10 mg by mouth every 4 (four) hours as needed for severe pain.   01/03/2022   pantoprazole (PROTONIX) 40 MG tablet Take 1 tablet (40 mg total) by mouth 2 (two) times daily before a meal.   01/03/2022   potassium chloride (KLOR-CON M) 10 MEQ tablet Take 10 mEq by mouth daily.   01/03/2022   thiamine 250 MG tablet Take 250 mg by mouth daily.   01/03/2022   torsemide (DEMADEX) 20 MG tablet Take 20 mg by mouth daily.   01/03/2022   vitamin B-12 (CYANOCOBALAMIN) 250 MCG tablet Take 250 mcg by mouth daily.  01/03/2022   Vitamin D, Ergocalciferol, (DRISDOL) 1.25 MG (50000 UNIT) CAPS capsule Take 50,000 Units by mouth every 7 (seven) days. Sundays   01/03/2022   vitamin E 200 UNIT capsule Take 400 Units by mouth daily.   01/03/2022   Scheduled:   calcium carbonate  1 tablet Oral Daily   folic acid  1 mg Oral Daily   lactulose  10 g Oral Daily   levothyroxine  50 mcg Oral QAC breakfast   midodrine  10 mg Oral TID WC   pantoprazole  40 mg Oral BID AC   thiamine  250 mg Oral Daily   torsemide  20 mg Oral Daily   vitamin B-12  250 mcg Oral Daily   Infusions:   heparin Stopped (01/05/22 0908)   PRN:  Anti-infectives (From admission, onward)    None       Assessment: Kristie Brooks a 56 y.o. female requires anticoagulation with a heparin iv infusion for the indication of  DVT. Heparin gtt will be started following pharmacy protocol per pharmacy consult. Patient is not on previous oral anticoagulant that will require aPTT/HL correlation before transitioning to only HL monitoring.   Hgb 8.5 at start of heparin  consult, will need to monitor closely   HL 0.12, subtherapeutic. Heparin infusion was stopped at 09:08 on 01/05/2022  Goal of Therapy:  Heparin level 0.3-0.7 units/ml Monitor platelets by anticoagulation protocol: Yes   Plan:  Continue heparin infusion at 1550 units/hr Confirm anti-Xa level in 6 hours and daily while on heparin Continue to monitor H&H and platelets  Kristie Brooks BS, PharmD, BCPS Clinical Pharmacist 01/05/2022,6:08 PM  *Addendum* Secure chatted LPN to ask that the heparin infusion be restart at the same rate as before. It was communicated to this author that the heparin infusion restarted around 1300 but I was not given a specific time. Will continue with the above plan given this patient was therapeutic on this dose this morning prior to the infusion being held for a procedure  Kristie Brooks BS, PharmD, BCPS Clinical Pharmacist 01/05/2022 18:21

## 2022-01-05 NOTE — Progress Notes (Signed)
ANTICOAGULATION CONSULT NOTE - Initial Consult  Pharmacy Consult for heparin gtt  Indication: DVT  Allergies  Allergen Reactions   Dakin's [Sodium Hypochlorite] Dermatitis   Zosyn [Piperacillin Sod-Tazobactam So] Other (See Comments)    Blood count dropped    Patient Measurements: Height: 5\' 5"  (165.1 cm) Weight: 104.3 kg (230 lb) IBW/kg (Calculated) : 57 Heparin Dosing Weight: HEPARIN DW (KG): 81.2   Vital Signs: Temp: 98.1 F (36.7 C) (01/10 2252) Temp Source: Oral (01/10 2032) BP: 113/66 (01/10 2252) Pulse Rate: 83 (01/10 2252)  Labs: Recent Labs    01/04/22 1759 01/04/22 1900 01/05/22 0023  HGB  --  10.8*  --   HCT  --  32.5*  --   PLT  --  114*  --   LABPROT 17.7*  --   --   INR 1.5*  --   --   HEPARINUNFRC  --   --  0.15*  CREATININE 0.94  --   --     Estimated Creatinine Clearance: 80.1 mL/min (by C-G formula based on SCr of 0.94 mg/dL).   Medical History: Past Medical History:  Diagnosis Date   Arthritis    Basal cell carcinoma 05/18/2021   nod-mid forehead (MOHS)   GERD (gastroesophageal reflux disease)    Hyperthyroidism    Peripheral vascular disease (HCC)    Pneumonia     Medications:  Medications Prior to Admission  Medication Sig Dispense Refill Last Dose   acetaminophen (TYLENOL) 325 MG tablet Take 650 mg by mouth every 6 (six) hours as needed for moderate pain.   Past Week   Ascorbic Acid (VITAMIN C) 1000 MG tablet Take 1,000 mg by mouth daily.   01/03/2022   calcium carbonate (TUMS - DOSED IN MG ELEMENTAL CALCIUM) 500 MG chewable tablet Chew 1 tablet by mouth daily.   01/03/2022   fexofenadine (ALLEGRA) 180 MG tablet Take by mouth.   01/03/2022   fish oil-omega-3 fatty acids 1000 MG capsule Take 1 g by mouth daily.   02/04/7988   folic acid (FOLVITE) 1 MG tablet Take 1 mg by mouth daily.   01/03/2022   levothyroxine (SYNTHROID) 50 MCG tablet Take 1 tablet (50 mcg total) by mouth daily before breakfast. 30 tablet 1 01/03/2022   magnesium oxide  (MAG-OX) 400 MG tablet Take 400 mg by mouth daily.   01/03/2022   midodrine (PROAMATINE) 10 MG tablet Take 1 tablet (10 mg total) by mouth 3 (three) times daily with meals.   01/03/2022   oxyCODONE (OXY IR/ROXICODONE) 5 MG immediate release tablet Take 5-10 mg by mouth every 4 (four) hours as needed for severe pain.   01/03/2022   pantoprazole (PROTONIX) 40 MG tablet Take 1 tablet (40 mg total) by mouth 2 (two) times daily before a meal.   01/03/2022   potassium chloride (KLOR-CON M) 10 MEQ tablet Take 10 mEq by mouth daily.   01/03/2022   thiamine 250 MG tablet Take 250 mg by mouth daily.   01/03/2022   torsemide (DEMADEX) 20 MG tablet Take 20 mg by mouth daily.   01/03/2022   vitamin B-12 (CYANOCOBALAMIN) 250 MCG tablet Take 250 mcg by mouth daily.   01/03/2022   Vitamin D, Ergocalciferol, (DRISDOL) 1.25 MG (50000 UNIT) CAPS capsule Take 50,000 Units by mouth every 7 (seven) days. Sundays   01/03/2022   vitamin E 200 UNIT capsule Take 400 Units by mouth daily.   01/03/2022   Scheduled:   calcium carbonate  1 tablet Oral Daily  folic acid  1 mg Oral Daily   lactulose  10 g Oral Daily   levothyroxine  50 mcg Oral QAC breakfast   midodrine  10 mg Oral TID WC   pantoprazole  40 mg Oral BID AC   potassium chloride  40 mEq Oral Once   thiamine  250 mg Oral Daily   vitamin B-12  250 mcg Oral Daily   Infusions:   heparin 1,300 Units/hr (01/04/22 1937)   PRN:  Anti-infectives (From admission, onward)    None       Assessment: Kristie Brooks a 56 y.o. female requires anticoagulation with a heparin iv infusion for the indication of acute DVT. Pharmacy consulted to dose IV heparin.   Initial anti-Xa level is subtherapeutic at 0.15 on 1300 units/hr of IV heparin.   Goal of Therapy:  Heparin level 0.3-0.7 units/ml Monitor platelets by anticoagulation protocol: Yes   Plan:  Give 2000 units bolus x 1, then increase heparin infusion at 1550 units/hr Check anti-Xa level in 6 hours and daily while on  heparin Continue to monitor H&H and platelets   Kristie Brooks, PharmD., BCPS, BCCCP Clinical Pharmacist Please refer to [facility] for unit-specific pharmacist

## 2022-01-05 NOTE — TOC Initial Note (Signed)
Transition of Care [facility]) - Initial/Assessment Note    Patient Details  Name: Kristie Brooks MRN: [id number] Date of Birth: 1966/03/19  Transition of Care [facility]) CM/SW Contact:    Ihor Gully, LCSW Phone Number: 01/05/2022, 10:22 AM  Clinical Narrative:                 Patient admitted from [facility]. Readmit, admitted for DVT. Patient can return to facility at d/c will need auth to return. Spoke with Ebony Hail with [facility], discussed potential d/c 1/12. Ebony Hail to have business office to start authorization.   Expected Discharge Plan: Skilled Nursing Facility Barriers to Discharge: Continued Medical Work up   Patient Goals and CMS Choice Patient states their goals for this hospitalization and ongoing recovery are:: return to rehab      Expected Discharge Plan and Services Expected Discharge Plan: [place] Choice: [place] Living arrangements for the past 2 months: [facility]                                      Prior Living Arrangements/Services Living arrangements for the past 2 months: [place] Lives with:: Facility Resident Patient language and need for interpreter reviewed:: Yes        Need for Family Participation in Patient Care: Yes (Comment) Care giver support system in place?: Yes (comment)   Criminal Activity/Legal Involvement Pertinent to Current Situation/Hospitalization: No - Comment as needed  Activities of Daily Living Home Assistive Devices/Equipment: Environmental consultant (specify type) ADL Screening (condition at time of admission) Patient's cognitive ability adequate to safely complete daily activities?: Yes Is the patient deaf or have difficulty hearing?: No Does the patient have difficulty seeing, even when wearing glasses/contacts?: Yes Does the patient have difficulty concentrating, remembering, or making  decisions?: No Patient able to express need for assistance with ADLs?: Yes Does the patient have difficulty dressing or bathing?: Yes Independently performs ADLs?: No Communication: Independent Is this a change from baseline?: Pre-admission baseline Dressing (OT): Needs assistance Is this a change from baseline?: Pre-admission baseline Grooming: Needs assistance Is this a change from baseline?: Pre-admission baseline Feeding: Independent Is this a change from baseline?: Pre-admission baseline Bathing: Needs assistance Is this a change from baseline?: Pre-admission baseline Toileting: Needs assistance Is this a change from baseline?: Pre-admission baseline In/Out Bed: Needs assistance Is this a change from baseline?: Pre-admission baseline Walks in Home: Needs assistance Is this a change from baseline?: Pre-admission baseline Does the patient have difficulty walking or climbing stairs?: Yes Weakness of Legs: Both Weakness of Arms/Hands: None  Permission Sought/Granted Permission sought to share information with : Facility Sport and exercise psychologist    Share Information with NAME: Ebony Hail           Emotional Assessment         Alcohol / Substance Use: Not Applicable Psych Involvement: No (comment)  Admission diagnosis:  DVT (deep venous thrombosis) (HCC) [I82.409] Acute deep vein thrombosis (DVT) of other vein of right upper extremity ([place]) [I82.621] Patient Active Problem List   Diagnosis Date Noted   Thyroid disease 01/05/2022   GERD (gastroesophageal reflux disease) 01/05/2022   DVT (deep venous thrombosis) ([place]) 01/04/2022   Sepsis due to undetermined organism ([place]) 12/22/2021   Cellulitis, leg 12/22/2021   Hematemesis    Cirrhosis of liver with ascites ([place])  Ascites    Anasarca    Pressure injury of skin 12/18/2021   Sepsis ([place]) 12/16/2021   PCP:  Keane Police, MD Pharmacy:   [id number] [place], [address] [place] [address] Phone: [phone] Fax: [phone]     Social Determinants of Health (SDOH) Interventions    Readmission Risk Interventions Readmission Risk Prevention Plan 12/24/2021 12/17/2021  Post Dischage Appt Complete -  Medication Screening Complete Complete  Transportation Screening Complete Complete  Some recent data might be hidden

## 2022-01-05 NOTE — H&P (Signed)
TRH H&P    Patient Demographics:    Kristie Brooks, is a 56 y.o. female  MRN: [id number]  DOB - 11-19-1966  Admit Date - 01/04/2022  Referring MD/NP/PA: Doren Custard  Outpatient Primary MD for the patient is Keane Police, MD  Patient coming from: [facility]  Chief complaint- DVT    HPI:    Kristie Brooks  is a 56 y.o. female, with history of GERD, hypothyroidism, peripheral vascular disease, liver failure, and recent admission for sepsis secondary to cellulitis, presents to the ED with a chief complaint of DVT.  During patient's last admission approximately 2 weeks ago she had a PICC line placed in her right upper extremity.  Since discharge she has had some erythema and tenderness as well as swelling to her right upper extremity.  She reports she noticed it right after discharge.  Her arm is also felt heavy.  She has not had any weakness in the arm.  The swelling decreased when she elevated her arm on a pillow.  She has no history of DVT, no history of PE.  No family history of clot.  Patient reports that the tenderness in her arm has a mild aching pain.  It was constant.  After she elevated her arm it improved.  Patient's only other complaint is abdominal pressure.  She reports she had a 4 L paracentesis done at her last admission.  She is interested in having another paracentesis done, but is not emergent and likely could be done outpatient.  Patient reports that the pressure in her abdomen has made her have a decreased appetite, but she has not lost any weight.  Patient has also had wound care to her lower extremities since being admitted for cellulitis of the lower extremity.  Her wounds are greatly improved per her report.  Wound care at the [facility] has been composed of washes with normal saline, Medihoney, Kerlix, and Ace wraps.  The straps of also helped with her lymphedema.  Patient has no other  complaints.  Patient does not smoke, does not drink alcohol, does not use illicit drugs.  She is vaccinated for COVID.  Patient is full code.  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   In the ED Afebrile, heart rate 72, respiratory rate 13-18, blood pressure 99/49, satting at 97 percent Slight leukopenia 3.8, hemoglobin 10.8 Hypokalemic at 3.1 Albumin 3.2 much improved from last admission Ultrasound reviewed from the [facility] shows acute occlusive deep vein thrombosis in the right upper extremity in the jugular, subclavian, axillary, veins. Admission requested for further management of DVT Patient started on heparin     Review of systems:    In addition to the HPI above,  No Fever-chills, No Headache, No changes with Vision or hearing, No problems swallowing food or Liquids, No Chest pain, Cough or Shortness of Breath, Admits to abdominal pressure, No Nausea or Vomiting, bowel movements are regular, No Blood in stool or Urine, No dysuria, Admits to improvement of skin wounds No new joints pains-aches,  No recent weight gain  or loss, No polyuria, polydypsia or polyphagia, No significant Mental Stressors.  All other systems reviewed and are negative.    Past History of the following :    Past Medical History:  Diagnosis Date   Arthritis    Basal cell carcinoma 05/18/2021   nod-mid forehead (MOHS)   GERD (gastroesophageal reflux disease)    Hyperthyroidism    Peripheral vascular disease (HCC)    Pneumonia       Past Surgical History:  Procedure Laterality Date   ESOPHAGOGASTRODUODENOSCOPY (EGD) WITH PROPOFOL N/A 12/24/2021   Procedure: ESOPHAGOGASTRODUODENOSCOPY (EGD) WITH PROPOFOL;  Surgeon: Rogene Houston, MD;  Location: [facility];  Service: Endoscopy;  Laterality: N/A;   TONSILLECTOMY        Social History:      Social History   Tobacco Use   Smoking status: Never   Smokeless tobacco: Never  Substance Use Topics   Alcohol use: Never        Family History :     Family History  Problem Relation Age of Onset   Breast cancer Mother    Hypertension Mother    Gastric cancer Father    Hypertension Brother    Alcohol abuse Brother    Pancreatitis Brother       Home Medications:   Prior to Admission medications   Medication Sig Start Date End Date Taking? Authorizing Provider  acetaminophen (TYLENOL) 325 MG tablet Take 650 mg by mouth every 6 (six) hours as needed for moderate pain.   Yes [provider]  Ascorbic Acid (VITAMIN C) 1000 MG tablet Take 1,000 mg by mouth daily.   Yes [provider]  calcium carbonate (TUMS - DOSED IN MG ELEMENTAL CALCIUM) 500 MG chewable tablet Chew 1 tablet by mouth daily.   Yes [provider]  fexofenadine (ALLEGRA) 180 MG tablet Take by mouth.   Yes [provider]  fish oil-omega-3 fatty acids 1000 MG capsule Take 1 g by mouth daily.   Yes [provider]  folic acid (FOLVITE) 1 MG tablet Take 1 mg by mouth daily.   Yes [provider]  levothyroxine (SYNTHROID) 50 MCG tablet Take 1 tablet (50 mcg total) by mouth daily before breakfast. 12/24/21  Yes Tat, David, MD  magnesium oxide (MAG-OX) 400 MG tablet Take 400 mg by mouth daily.   Yes [provider]  midodrine (PROAMATINE) 10 MG tablet Take 1 tablet (10 mg total) by mouth 3 (three) times daily with meals. 12/24/21  Yes Tat, Shanon Brow, MD  oxyCODONE (OXY IR/ROXICODONE) 5 MG immediate release tablet Take 5-10 mg by mouth every 4 (four) hours as needed for severe pain.   Yes [provider]  pantoprazole (PROTONIX) 40 MG tablet Take 1 tablet (40 mg total) by mouth 2 (two) times daily before a meal. 12/24/21  Yes Tat, David, MD  potassium chloride (KLOR-CON M) 10 MEQ tablet Take 10 mEq by mouth daily.   Yes [provider]  thiamine 250 MG tablet Take 250 mg by mouth daily.   Yes [provider]  torsemide (DEMADEX) 20 MG tablet Take 20 mg by mouth daily.    Yes [provider]  vitamin B-12 (CYANOCOBALAMIN) 250 MCG tablet Take 250 mcg by mouth daily.   Yes [provider]  Vitamin D, Ergocalciferol, (DRISDOL) 1.25 MG (50000 UNIT) CAPS capsule Take 50,000 Units by mouth every 7 (seven) days. Sundays   Yes [provider]  vitamin E 200 UNIT capsule Take 400 Units  by mouth daily.   Yes [provider]     Allergies:     Allergies  Allergen Reactions   Dakin's [Sodium Hypochlorite] Dermatitis   Zosyn [Piperacillin Sod-Tazobactam So] Other (See Comments)    Blood count dropped     Physical Exam:   Vitals  Blood pressure 113/66, pulse 83, temperature 98.1 F (36.7 C), resp. rate 18, height 5\' 5"  (1.651 m), weight 104.3 kg, SpO2 93 %.   1.  General: Patient sitting on edge of bed, no acute distress   2. Psychiatric: Alert and oriented x 3, mood and behavior normal for situation, pleasant and cooperative with exam   3. Neurologic: Speech and language are normal, face is symmetric, moves all 4 extremities voluntarily, at baseline without acute deficits on limited exam   4. HEENMT:  Head is atraumatic, normocephalic, pupils reactive to light, neck is supple, trachea is midline, mucous membranes are moist   5. Respiratory : Lungs are clear to auscultation bilaterally without wheezing, rhonchi, rales, no cyanosis, no increase in work of breathing or accessory muscle use   6. Cardiovascular : Heart rate normal, rhythm is regular, no murmurs, rubs or gallops,  peripheral pulses palpated   7. Gastrointestinal:  Abdomen is soft, with fluid wave, nontender to palpation bowel sounds active, no masses or organomegaly palpated   8. Skin:  Skin is warm, dry and intact without rashes, acute lesions, or ulcers on limited exam   9.Musculoskeletal:  Right upper extremity is minimally tender to palpation, no discoloration, no loss of sensation, no noticeable edema, bilateral lower extremity edema     Data  Review:    CBC Recent Labs  Lab 01/04/22 1900  WBC 3.8*  HGB 10.8*  HCT 32.5*  PLT 114*  MCV 92.1  MCH 30.6  MCHC 33.2  RDW 19.0*  LYMPHSABS 0.8  MONOABS 0.4  EOSABS 0.1  BASOSABS 0.0   ------------------------------------------------------------------------------------------------------------------  Results for orders placed or performed during the hospital encounter of 01/04/22 (from the past 48 hour(s))  Protime-INR     Status: Abnormal   Collection Time: 01/04/22  5:59 PM  Result Value Ref Range   Prothrombin Time 17.7 (H) 11.4 - 15.2 seconds   INR 1.5 (H) 0.8 - 1.2    Comment: (NOTE) INR goal varies based on device and disease states. Performed at [facility], [address], [place], [place] [address]   Comprehensive metabolic panel     Status: Abnormal   Collection Time: 01/04/22  5:59 PM  Result Value Ref Range   Sodium 139 135 - 145 mmol/L   Potassium 3.1 (L) 3.5 - 5.1 mmol/L   Chloride 99 98 - 111 mmol/L   CO2 28 22 - 32 mmol/L   Glucose, Bld 85 70 - 99 mg/dL    Comment: Glucose reference range applies only to samples taken after fasting for at least 8 hours.   BUN 13 6 - 20 mg/dL   Creatinine, Ser 0.94 0.44 - 1.00 mg/dL   Calcium 8.2 (L) 8.9 - 10.3 mg/dL   Total Protein 5.7 (L) 6.5 - 8.1 g/dL   Albumin 3.2 (L) 3.5 - 5.0 g/dL   AST 32 15 - 41 U/L   ALT 10 0 - 44 U/L   Alkaline Phosphatase 101 38 - 126 U/L   Total Bilirubin 2.3 (H) 0.3 - 1.2 mg/dL   GFR, Estimated >60 >60 mL/min    Comment: (NOTE) Calculated using the CKD-EPI Creatinine Equation (2021)    Anion gap  12 5 - 15    Comment: Performed at [facility], [address], [place], [place] [address]  CBC with Differential/Platelet     Status: Abnormal   Collection Time: 01/04/22  7:00 PM  Result Value Ref Range   WBC 3.8 (L) 4.0 - 10.5 K/uL   RBC 3.53 (L) 3.87 - 5.11 MIL/uL   Hemoglobin 10.8 (L) 12.0 - 15.0 g/dL   HCT 32.5 (L) 36.0 - 46.0 %   MCV 92.1 80.0 - 100.0 fL   MCH 30.6 26.0 - 34.0  pg   MCHC 33.2 30.0 - 36.0 g/dL   RDW 19.0 (H) 11.5 - 15.5 %   Platelets 114 (L) 150 - 400 K/uL    Comment: SPECIMEN CHECKED FOR CLOTS Immature Platelet Fraction may be clinically indicated, consider ordering this additional test [id number] REPEATED TO VERIFY PLATELET COUNT CONFIRMED BY SMEAR    nRBC 0.0 0.0 - 0.2 %   Neutrophils Relative % 63 %   Neutro Abs 2.4 1.7 - 7.7 K/uL   Lymphocytes Relative 22 %   Lymphs Abs 0.8 0.7 - 4.0 K/uL   Monocytes Relative 11 %   Monocytes Absolute 0.4 0.1 - 1.0 K/uL   Eosinophils Relative 3 %   Eosinophils Absolute 0.1 0.0 - 0.5 K/uL   Basophils Relative 1 %   Basophils Absolute 0.0 0.0 - 0.1 K/uL   WBC Morphology MORPHOLOGY UNREMARKABLE    Smear Review PLATELET COUNT CONFIRMED BY SMEAR    Immature Granulocytes 0 %   Abs Immature Granulocytes 0.01 0.00 - 0.07 K/uL   Tear Drop Cells PRESENT     Comment: Performed at [facility], [address], [place], [place] [address]  Resp Panel by RT-PCR (Flu A&B, Covid) Nasopharyngeal Swab     Status: None   Collection Time: 01/04/22  9:14 PM   Specimen: Nasopharyngeal Swab; Nasopharyngeal(NP) swabs in vial transport medium  Result Value Ref Range   SARS Coronavirus 2 by RT PCR NEGATIVE NEGATIVE    Comment: (NOTE) SARS-CoV-2 target nucleic acids are NOT DETECTED.  The SARS-CoV-2 RNA is generally detectable in upper respiratory specimens during the acute phase of infection. The lowest concentration of SARS-CoV-2 viral copies this assay can detect is 138 copies/mL. A negative result does not preclude SARS-Cov-2 infection and should not be used as the sole basis for treatment or other patient management decisions. A negative result may occur with  improper specimen collection/handling, submission of specimen other than nasopharyngeal swab, presence of viral mutation(s) within the areas targeted by this assay, and inadequate number of viral copies(<138 copies/mL). A negative result must be combined  with clinical observations, patient history, and epidemiological information. The expected result is Negative.  Fact Sheet for Patients:  EntrepreneurPulse.com.au  Fact Sheet for Healthcare Providers:  IncredibleEmployment.be  This test is no t yet approved or cleared by the [place] FDA and  has been authorized for detection and/or diagnosis of SARS-CoV-2 by FDA under an Emergency Use Authorization (EUA). This EUA will remain  in effect (meaning this test can be used) for the duration of the COVID-19 declaration under Section 564(b)(1) of the Act, 21 U.S.C.section 360bbb-3(b)(1), unless the authorization is terminated  or revoked sooner.       Influenza A by PCR NEGATIVE NEGATIVE   Influenza B by PCR NEGATIVE NEGATIVE    Comment: (NOTE) The Xpert Xpress SARS-CoV-2/FLU/RSV plus assay is intended as an aid in the diagnosis of influenza from Nasopharyngeal swab specimens and should not be used as  a sole basis for treatment. Nasal washings and aspirates are unacceptable for Xpert Xpress SARS-CoV-2/FLU/RSV testing.  Fact Sheet for Patients: EntrepreneurPulse.com.au  Fact Sheet for Healthcare Providers: IncredibleEmployment.be  This test is not yet approved or cleared by the [place] FDA and has been authorized for detection and/or diagnosis of SARS-CoV-2 by FDA under an Emergency Use Authorization (EUA). This EUA will remain in effect (meaning this test can be used) for the duration of the COVID-19 declaration under Section 564(b)(1) of the Act, 21 U.S.C. section 360bbb-3(b)(1), unless the authorization is terminated or revoked.  Performed at [facility], [address] [place], [place] [address]   Heparin level (unfractionated)     Status: Abnormal   Collection Time: 01/05/22 12:23 AM  Result Value Ref Range   Heparin Unfractionated 0.15 (L) 0.30 - 0.70 IU/mL    Comment: (NOTE) The clinical  reportable range upper limit is being lowered to >1.10 to align with the FDA approved guidance for the current laboratory assay.  If heparin results are below expected values, and patient dosage has  been confirmed, suggest follow up testing of antithrombin III levels. Performed at [facility], [address], [place], [place] [address]     Chemistries  Recent Labs  Lab 01/04/22 1759  NA 139  K 3.1*  CL 99  CO2 28  GLUCOSE 85  BUN 13  CREATININE 0.94  CALCIUM 8.2*  AST 32  ALT 10  ALKPHOS 101  BILITOT 2.3*   ------------------------------------------------------------------------------------------------------------------  ------------------------------------------------------------------------------------------------------------------ GFR: Estimated Creatinine Clearance: 80.1 mL/min (by C-G formula based on SCr of 0.94 mg/dL). Liver Function Tests: Recent Labs  Lab 01/04/22 1759  AST 32  ALT 10  ALKPHOS 101  BILITOT 2.3*  PROT 5.7*  ALBUMIN 3.2*   No results for input(s): LIPASE, AMYLASE in the last 168 hours. No results for input(s): AMMONIA in the last 168 hours. Coagulation Profile: Recent Labs  Lab 01/04/22 1759  INR 1.5*   Cardiac Enzymes: No results for input(s): CKTOTAL, CKMB, CKMBINDEX, TROPONINI in the last 168 hours. BNP (last 3 results) No results for input(s): PROBNP in the last 8760 hours. HbA1C: No results for input(s): HGBA1C in the last 72 hours. CBG: No results for input(s): GLUCAP in the last 168 hours. Lipid Profile: No results for input(s): CHOL, HDL, LDLCALC, TRIG, CHOLHDL, LDLDIRECT in the last 72 hours. Thyroid Function Tests: No results for input(s): TSH, T4TOTAL, FREET4, T3FREE, THYROIDAB in the last 72 hours. Anemia Panel: No results for input(s): VITAMINB12, FOLATE, FERRITIN, TIBC, IRON, RETICCTPCT in the last 72  hours.  --------------------------------------------------------------------------------------------------------------- Urine analysis:    Component Value Date/Time   COLORURINE YELLOW 12/16/2021 [place] 12/16/2021 1259   LABSPEC 1.010 12/16/2021 1259   PHURINE 6.5 12/16/2021 1259   GLUCOSEU NEGATIVE 12/16/2021 1259   HGBUR NEGATIVE 12/16/2021 1259   BILIRUBINUR NEGATIVE 12/16/2021 1259   KETONESUR NEGATIVE 12/16/2021 1259   PROTEINUR 30 (A) 12/16/2021 1259   NITRITE NEGATIVE 12/16/2021 1259   LEUKOCYTESUR NEGATIVE 12/16/2021 1259      Imaging Results:    No results found.    Assessment & Plan:    Principal Problem:   DVT (deep venous thrombosis) (HCC) Active Problems:   Pressure injury of skin   Thyroid disease   GERD (gastroesophageal reflux disease)   DVT Continue heparin Counseled patient regarding anticoagulation at discharge Patient reports understanding Chronic respiratory failure Continue nasal cannula Patient was on 4 L nasal cannula at last admission, on 2 L nasal cannula  today Continue to monitor Hypokalemia Replace and recheck Mild protein calorie malnutrition Much improved from last admission Continue to encourage nutrient dense food choices Thyroid disease Continue Synthroid Liver failure With chronic hypotension Continue midodrine Continue lactulose Continue to monitor GERD Continue PPI Wound care Continue clean wounds with normal saline, apply Medihoney, wrapped with Kerlix and Ace wrap bilateral lower extremities Wound consult in the a.m.   DVT Prophylaxis-   Heparin   AM Labs Ordered, also please review Full Orders  Family Communication: No family at bedside  Code Status: Full  Admission status: Observation Disposition: Anticipated Discharge date 24-48 hours discharge to [facility]  Time spent in minutes : [place]

## 2022-01-05 NOTE — Progress Notes (Signed)
PROGRESS NOTE  Kristie Brooks XBM:[id number] DOB: 04-05-1966 DOA: 01/04/2022 PCP: Keane Police, MD  Brief History:  56 year old female with a history of liver cirrhosis, chronic respiratory failure on 2 L, hypothyroidism, venous stasis dermatitis of the lower extremities presenting with right upper extremity discomfort.  The patient was recently discharged from the hospital after a stay from 12/15/2021 to 12/26/2021 during which she was treated for cellulitis of the lower extremities and sepsis.  She was discharged with 3 additional days of Bactrim DS.  The patient had intolerance to doxycycline. During that hospitalization, the patient had a PICC line in the right upper extremity.  During the hospitalization, the patient did not have any edema or pain in the right upper extremity.  However 1 day after returning to her SNF, the patient complained of discomfort in her right upper extremity.  Venous duplex of the right upper extremity was obtained and showed acute DVT in the right IJ, subclavian, axillary, and basilic veins.  Patient states that she has never had a DVT in the past.  She denies any fevers, chills, chest pain, shortness breath, cough, hemoptysis, headache, neck pain.  The patient was started on intravenous heparin and admitted for further evaluation and treatment.   Assessment/Plan: Acute right upper extremity DVT -Provoked by clinical history -Patient had a right upper extremity PICC line last hospital admission -Plan to transition to apixaban in the next 24 hours -Continue IV heparin  Liver cirrhosis with ascites -Request ultrasound to see if paracentesis is required -Continue home dose torsemide -Patient refusing to take lactulose  Venous stasis dermatitis -Wound care consult  Hypothyroidism -Continue Synthroid  Hypokalemia/hypomagnesemia -Replete  Class II obesity -BMI of 38.27 -Lifestyle modification  Chronic respiratory failure with  hypoxia -She is chronically on 2 L -Stable presently  Hypotension -The patient has soft blood pressure at the baseline -Continue midodrine          Family Communication:   no Family at bedside  Consultants:  none  Code Status:  FULL   DVT Prophylaxis:  IV Heparin   Procedures: As Listed in Progress Note Above  Antibiotics: None   Total time spent 40 minutes.  Greater than 50% spent face to face counseling and coordinating care.    Subjective: Patient denies fevers, chills, headache, chest pain, dyspnea, nausea, vomiting, diarrhea, abdominal pain, dysuria, hematuria, hematochezia, and melena.   Objective: Vitals:   01/04/22 2030 01/04/22 2032 01/04/22 2252 01/05/22 0527  BP: (!) 94/48  113/66 (!) 94/47  Pulse: 73  83 81  Resp: 17  18 17   Temp:  97.9 F (36.6 C) 98.1 F (36.7 C) 98 F (36.7 C)  TempSrc:  Oral    SpO2: 96%  93% 90%  Weight:      Height:        Intake/Output Summary (Last 24 hours) at 01/05/2022 0842 Last data filed at 01/05/2022 0300 Gross per 24 hour  Intake 155.09 ml  Output 550 ml  Net -394.91 ml   Weight change:  Exam:  General:  Pt is alert, follows commands appropriately, not in acute distress HEENT: No icterus, No thrush, No neck mass, [place]/AT Cardiovascular: RRR, S1/S2, no rubs, no gallops Respiratory: fine bibasilar rales. No wheeze Abdomen: Soft/+BS, non tender, non distended, no guarding Extremities: 1 + LE edema, No lymphangitis, No petechiae, No rashes, no synovitis   Data Reviewed: I have personally reviewed following labs and imaging studies Basic Metabolic Panel: Recent  Labs  Lab 01/04/22 1759 01/05/22 0548  NA 139 137  K 3.1* 2.9*  CL 99 100  CO2 28 28  GLUCOSE 85 92  BUN 13 13  CREATININE 0.94 0.90  CALCIUM 8.2* 7.8*  MG  --  1.6*   Liver Function Tests: Recent Labs  Lab 01/04/22 1759 01/05/22 0548  AST 32 30  ALT 10 10  ALKPHOS 101 87  BILITOT 2.3* 1.8*  PROT 5.7* 4.9*  ALBUMIN 3.2* 2.8*    No results for input(s): LIPASE, AMYLASE in the last 168 hours. No results for input(s): AMMONIA in the last 168 hours. Coagulation Profile: Recent Labs  Lab 01/04/22 1759  INR 1.5*   CBC: Recent Labs  Lab 01/04/22 1900 01/05/22 0548  WBC 3.8* 3.3*  NEUTROABS 2.4 1.9  HGB 10.8* 9.0*  HCT 32.5* 28.0*  MCV 92.1 91.8  PLT 114* 101*   Cardiac Enzymes: No results for input(s): CKTOTAL, CKMB, CKMBINDEX, TROPONINI in the last 168 hours. BNP: Invalid input(s): POCBNP CBG: No results for input(s): GLUCAP in the last 168 hours. HbA1C: No results for input(s): HGBA1C in the last 72 hours. Urine analysis:    Component Value Date/Time   COLORURINE YELLOW 12/16/2021 [place] 12/16/2021 1259   LABSPEC 1.010 12/16/2021 1259   PHURINE 6.5 12/16/2021 1259   GLUCOSEU NEGATIVE 12/16/2021 1259   HGBUR NEGATIVE 12/16/2021 1259   BILIRUBINUR NEGATIVE 12/16/2021 1259   KETONESUR NEGATIVE 12/16/2021 1259   PROTEINUR 30 (A) 12/16/2021 1259   NITRITE NEGATIVE 12/16/2021 1259   LEUKOCYTESUR NEGATIVE 12/16/2021 1259   Sepsis Labs: @LABRCNTIP (procalcitonin:4,lacticidven:4) ) Recent Results (from the past 240 hour(s))  Resp Panel by RT-PCR (Flu A&B, Covid) Nasopharyngeal Swab     Status: None   Collection Time: 01/04/22  9:14 PM   Specimen: Nasopharyngeal Swab; Nasopharyngeal(NP) swabs in vial transport medium  Result Value Ref Range Status   SARS Coronavirus 2 by RT PCR NEGATIVE NEGATIVE Final    Comment: (NOTE) SARS-CoV-2 target nucleic acids are NOT DETECTED.  The SARS-CoV-2 RNA is generally detectable in upper respiratory specimens during the acute phase of infection. The lowest concentration of SARS-CoV-2 viral copies this assay can detect is 138 copies/mL. A negative result does not preclude SARS-Cov-2 infection and should not be used as the sole basis for treatment or other patient management decisions. A negative result may occur with  improper specimen  collection/handling, submission of specimen other than nasopharyngeal swab, presence of viral mutation(s) within the areas targeted by this assay, and inadequate number of viral copies(<138 copies/mL). A negative result must be combined with clinical observations, patient history, and epidemiological information. The expected result is Negative.  Fact Sheet for Patients:  EntrepreneurPulse.com.au  Fact Sheet for Healthcare Providers:  IncredibleEmployment.be  This test is no t yet approved or cleared by the [place] FDA and  has been authorized for detection and/or diagnosis of SARS-CoV-2 by FDA under an Emergency Use Authorization (EUA). This EUA will remain  in effect (meaning this test can be used) for the duration of the COVID-19 declaration under Section 564(b)(1) of the Act, 21 U.S.C.section 360bbb-3(b)(1), unless the authorization is terminated  or revoked sooner.       Influenza A by PCR NEGATIVE NEGATIVE Final   Influenza B by PCR NEGATIVE NEGATIVE Final    Comment: (NOTE) The Xpert Xpress SARS-CoV-2/FLU/RSV plus assay is intended as an aid in the diagnosis of influenza from Nasopharyngeal swab specimens and should not be used as a sole basis  for treatment. Nasal washings and aspirates are unacceptable for Xpert Xpress SARS-CoV-2/FLU/RSV testing.  Fact Sheet for Patients: EntrepreneurPulse.com.au  Fact Sheet for Healthcare Providers: IncredibleEmployment.be  This test is not yet approved or cleared by the [place] FDA and has been authorized for detection and/or diagnosis of SARS-CoV-2 by FDA under an Emergency Use Authorization (EUA). This EUA will remain in effect (meaning this test can be used) for the duration of the COVID-19 declaration under Section 564(b)(1) of the Act, 21 U.S.C. section 360bbb-3(b)(1), unless the authorization is terminated or revoked.  Performed at [facility], [address], [place], [place] [address]      Scheduled Meds:  calcium carbonate  1 tablet Oral Daily   folic acid  1 mg Oral Daily   lactulose  10 g Oral Daily   levothyroxine  50 mcg Oral QAC breakfast   midodrine  10 mg Oral TID WC   pantoprazole  40 mg Oral BID AC   thiamine  250 mg Oral Daily   vitamin B-12  250 mcg Oral Daily   Continuous Infusions:  heparin 1,550 Units/hr (01/05/22 0217)    Procedures/Studies: CT PELVIS W CONTRAST  Result Date: 12/16/2021 CLINICAL DATA:  Soft tissue infection EXAM: CT PELVIS WITH CONTRAST TECHNIQUE: Multidetector CT imaging of the pelvis was performed using the standard protocol following the bolus administration of intravenous contrast. CONTRAST:  137mL OMNIPAQUE IOHEXOL 300 MG/ML  SOLN COMPARISON:  None. FINDINGS: Urinary Tract:  No abnormality visualized. Bowel: Unremarkable visualized pelvic bowel loops. There is large volume ascites present within the pelvis. Vascular/Lymphatic: A massive varix is seen extending from the umbilicus to the left common iliac vein, likely representing a portosystemic collateral in the setting of portal venous hypertension. No pathologic adenopathy within the abdomen and pelvis. Reproductive: Involuted fibroids noted within the uterus. The pelvic organs are otherwise unremarkable. Other: There is extensive diffuse subcutaneous body wall noted within the visualized abdominal wall and flanks bilaterally. Musculoskeletal: No acute bone abnormality. No lytic or blastic bone lesion. IMPRESSION: Marked diffuse subcutaneous body wall edema. Large volume ascites. Massive varix extending from the umbilicus the left common iliac vein. Together, the findings are most in keeping with changes of portal venous hypertension. Electronically Signed   By: Fidela Salisbury M.D.   On: 12/16/2021 02:16   US Paracentesis  Result Date: 12/21/2021 INDICATION: Ascites.  Hepatitis-C.  Cirrhosis. EXAM: ULTRASOUND GUIDED  PARACENTESIS  MEDICATIONS: None. COMPLICATIONS: None immediate. PROCEDURE: Informed written consent was obtained from the patient after a discussion of the risks (including hemorrhage and infection, among others), benefits and alternatives to treatment. A timeout was performed prior to the initiation of the procedure. Initial ultrasound scanning demonstrates a large amount of ascites within the right lower abdominal quadrant. The right lower abdomen was prepped and draped in the usual sterile fashion. 1% lidocaine was used for local anesthesia. An ultrasound image was saved for documentation purposes. Following this, a Yueh catheter was introduced. The paracentesis was performed. The catheter was removed and a dressing was applied. The patient tolerated the procedure well without immediate post procedural complication. FINDINGS: A total of approximately 4 L of straw-colored fluid was removed. Samples were sent to the laboratory as requested by the clinical team. IMPRESSION: Successful ultrasound-guided paracentesis yielding 4 liters of peritoneal fluid. Electronically Signed   By: Van Clines M.D.   On: 12/21/2021 14:09   DG Chest Port 1 View  Result Date: 12/15/2021 CLINICAL DATA:  Fever and lower extremity swelling. EXAM: PORTABLE  CHEST 1 VIEW COMPARISON:  November 10, 2021 FINDINGS: Low lung volumes are seen. Mild to moderate severity areas of atelectasis and/or infiltrate are seen within the bilateral lung bases. There is a small left pleural effusion. No pneumothorax is identified. The heart size and mediastinal contours are within normal limits. Degenerative changes are noted throughout the thoracic spine. IMPRESSION: 1. Low lung volumes with mild to moderate severity bibasilar atelectasis and/or infiltrate. 2. Small left pleural effusion. Electronically Signed   By: Virgina Norfolk M.D.   On: 12/15/2021 22:47   ECHOCARDIOGRAM COMPLETE  Result Date: 12/16/2021    ECHOCARDIOGRAM REPORT   Patient Name:    KALISE FICKETT Date of Exam: 12/16/2021 Medical Rec #:  [id number]           Height:       65.0 in Accession #:    [id number]          Weight:       290.0 lb Date of Birth:  1966/11/09           BSA:          2.317 m Patient Age:    49 years            BP:           73/36 mmHg Patient Gender: F                   HR:           106 bpm. Exam Location:  [facility] Procedure: 2D Echo, Cardiac Doppler and Color Doppler Indications:    Edema  History:        Patient has no prior history of Echocardiogram examinations.                 Signs/Symptoms:Edema.  Sonographer:    Wenda Low Referring Phys: [id number] [facility]  Sonographer Comments: Patient is morbidly obese and no subcostal window. IMPRESSIONS  1. Left ventricular ejection fraction, by estimation, is 65 to 70%. The left ventricle has normal function. The left ventricle has no regional wall motion abnormalities. The left ventricular internal cavity size was mildly dilated. There is mild left ventricular hypertrophy. Left ventricular diastolic parameters were normal.  2. Right ventricular systolic function is normal. The right ventricular size is normal. There is normal pulmonary artery systolic pressure.  3. The mitral valve is normal in structure. Trivial mitral valve regurgitation.  4. The aortic valve is tricuspid. Aortic valve regurgitation is not visualized. Aortic valve sclerosis is present, with no evidence of aortic valve stenosis. FINDINGS  Left Ventricle: Left ventricular ejection fraction, by estimation, is 65 to 70%. The left ventricle has normal function. The left ventricle has no regional wall motion abnormalities. The left ventricular internal cavity size was mildly dilated. There is  mild left ventricular hypertrophy. Left ventricular diastolic parameters were normal. Right Ventricle: The right ventricular size is normal. Right vetricular wall thickness was not assessed. Right ventricular systolic function is normal. There is  normal pulmonary artery systolic pressure. The tricuspid regurgitant velocity is 2.48 m/s, and with an assumed right atrial pressure of 8 mmHg, the estimated right ventricular systolic pressure is 67.2 mmHg. Left Atrium: Left atrial size was normal in size. Right Atrium: Right atrial size was normal in size. Pericardium: There is no evidence of pericardial effusion. Mitral Valve: The mitral valve is normal in structure. Trivial mitral valve regurgitation. MV peak gradient, 7.2 mmHg. The mean mitral valve gradient is 4.0  mmHg. Tricuspid Valve: The tricuspid valve is normal in structure. Tricuspid valve regurgitation is trivial. Aortic Valve: The aortic valve is tricuspid. Aortic valve regurgitation is not visualized. Aortic valve sclerosis is present, with no evidence of aortic valve stenosis. Aortic valve mean gradient measures 8.0 mmHg. Aortic valve peak gradient measures 16.6 mmHg. Aortic valve area, by VTI measures 2.25 cm. Pulmonic Valve: The pulmonic valve was normal in structure. Pulmonic valve regurgitation is not visualized. Aorta: The aortic root is normal in size and structure. IAS/Shunts: No atrial level shunt detected by color flow Doppler.  LEFT VENTRICLE PLAX 2D LVIDd:         5.20 cm     Diastology LVIDs:         3.20 cm     LV e' medial:    13.40 cm/s LV PW:         1.10 cm     LV E/e' medial:  8.0 LV IVS:        1.00 cm     LV e' lateral:   16.80 cm/s LVOT diam:     2.00 cm     LV E/e' lateral: 6.4 LV SV:         100 LV SV Index:   43 LVOT Area:     3.14 cm  LV Volumes (MOD) LV vol d, MOD A2C: 75.0 ml LV vol d, MOD A4C: 69.1 ml LV vol s, MOD A2C: 32.3 ml LV vol s, MOD A4C: 27.0 ml LV SV MOD A2C:     42.7 ml LV SV MOD A4C:     69.1 ml LV SV MOD BP:      42.2 ml RIGHT VENTRICLE RV Basal diam:  3.30 cm RV Mid diam:    4.00 cm RV S prime:     19.20 cm/s TAPSE (M-mode): 3.1 cm LEFT ATRIUM             Index        RIGHT ATRIUM           Index LA diam:        4.50 cm 1.94 cm/m   RA Area:     17.50 cm  LA Vol (A2C):   48.1 ml 20.76 ml/m  RA Volume:   41.20 ml  17.79 ml/m LA Vol (A4C):   92.3 ml 39.84 ml/m LA Biplane Vol: 71.3 ml 30.78 ml/m  AORTIC VALVE                     PULMONIC VALVE AV Area (Vmax):    2.39 cm      PV Vmax:       1.21 m/s AV Area (Vmean):   2.65 cm      PV Peak grad:  5.9 mmHg AV Area (VTI):     2.25 cm AV Vmax:           204.00 cm/s AV Vmean:          128.000 cm/s AV VTI:            0.444 m AV Peak Grad:      16.6 mmHg AV Mean Grad:      8.0 mmHg LVOT Vmax:         155.00 cm/s LVOT Vmean:        108.000 cm/s LVOT VTI:          0.318 m LVOT/AV VTI ratio: 0.72  AORTA Ao Root diam: 2.80 cm MITRAL VALVE  TRICUSPID VALVE MV Area (PHT): 3.13 cm     TR Peak grad:   24.6 mmHg MV Area VTI:   3.86 cm     TR Vmax:        248.00 cm/s MV Peak grad:  7.2 mmHg MV Mean grad:  4.0 mmHg     SHUNTS MV Vmax:       1.34 m/s     Systemic VTI:  0.32 m MV Vmean:      88.2 cm/s    Systemic Diam: 2.00 cm MV Decel Time: 242 msec MV E velocity: 107.00 cm/s MV A velocity: 89.40 cm/s MV E/A ratio:  1.20 Dorris Carnes MD Electronically signed by Dorris Carnes MD Signature Date/Time: 12/16/2021/3:38:01 PM    Final    US LIVER DOPPLER  Result Date: 12/21/2021 CLINICAL DATA:  56 year old female with cirrhosis EXAM: DUPLEX ULTRASOUND OF LIVER TECHNIQUE: Color and duplex Doppler ultrasound was performed to evaluate the hepatic in-flow and out-flow vessels. COMPARISON:  None. FINDINGS: Portal Vein Velocities Main:  58 cm/sec Right:  57 cm/sec Left: Not visualized Hepatic Vein Velocities Right:  34 cm/sec Middle: Not visualized Left:  60 cm/sec Hepatic Artery Velocity:  78 cm/sec Splenic Vein Velocity:  59 cm/sec Varices: Present Ascites: Present Recanalized umbilical vein. Cirrhotic morphology of the liver IMPRESSION: Cirrhotic morphology of the liver with stigmata of portal hypertension including recanalized umbilical vein, additional varices, and ascites Signed, Dulcy Fanny. Dellia Nims, RPVI [facility] Radiology Electronically Signed   By: Corrie Mckusick D.O.   On: 12/21/2021 12:23   CT EXTREM LOWER W CM BIL  Result Date: 12/16/2021 CLINICAL DATA:  Soft tissue infection of the lower extremities bilaterally EXAM: CT OF THE LOWER BILATERAL EXTREMITY WITH CONTRAST TECHNIQUE: Multidetector CT imaging of the lower bilateral extremity was performed with axial images obtained from the acetabular roofs through the proximal tibial metadiaphysis following intravenous contrast administration. COMPARISON:  None. CONTRAST:  152mL OMNIPAQUE IOHEXOL 300 MG/ML  SOLN FINDINGS: Bones/Joint/Cartilage Normal alignment. No acute fracture or dislocation. No osseous erosion or abnormal periosteal reaction. 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 Ligaments Suboptimally assessed by CT. Muscles and Tendons Unremarkable Soft tissues There is marked diffuse subcutaneous edema involving the lower extremities bilaterally circumferentially. Edematous changes extend into the inter fascial planes of the anterior, posterior, and medial muscular compartments of the thighs bilaterally. No discrete drainable fluid collection identified. Small bilateral knee effusions are present. Extensive subcutaneous edema is noted within the visualized pannus bilaterally. Large volume ascites is present within the visualized pelvis. IMPRESSION: Extensive subcutaneous edema involving the lower extremities bilaterally and pannus. Infiltrative changes extend into the muscular compartments of the thighs bilaterally. No discrete drainable fluid collection is identified, however. Small bilateral knee effusions. Large volume ascites. Electronically Signed   By: Fidela Salisbury M.D.   On: 12/16/2021 02:12   [place] EKG SITE RITE  Result Date: 12/16/2021 If Site Rite image not attached, placement could not be confirmed due to current cardiac rhythm.   Orson Eva, DO  [facility]  If 7PM-7AM, please contact night-coverage [email or web address] Password [email or web address] 01/05/2022, 8:42 AM   LOS: 0 days

## 2022-01-05 NOTE — Progress Notes (Signed)
Right sided paracentesis procedure complete at this time and 3 Liters of clear yellow ascites removed. PT verbalized understanding of post procedure instructions and left with transporter via stretcher back to inpatient bed assignment with no acute distress noted.

## 2022-01-05 NOTE — Progress Notes (Signed)
ANTICOAGULATION CONSULT NOTE - Initial Consult  Pharmacy Consult for heparin gtt  Indication: DVT  Allergies  Allergen Reactions   Dakin's [Sodium Hypochlorite] Dermatitis   Zosyn [Piperacillin Sod-Tazobactam So] Other (See Comments)    Blood count dropped    Patient Measurements: Height: 5\' 5"  (165.1 cm) Weight: 104.3 kg (230 lb) IBW/kg (Calculated) : 57 Heparin Dosing Weight: HEPARIN DW (KG): 81.2   Vital Signs: Temp: 98 F (36.7 C) (01/11 0527) BP: 94/47 (01/11 0527) Pulse Rate: 81 (01/11 0527)  Labs: Recent Labs    01/04/22 1759 01/04/22 1900 01/05/22 0023 01/05/22 0548 01/05/22 0752  HGB  --  10.8*  --  9.0*  --   HCT  --  32.5*  --  28.0*  --   PLT  --  114*  --  101*  --   LABPROT 17.7*  --   --   --   --   INR 1.5*  --   --   --   --   HEPARINUNFRC  --   --  0.15*  --  0.34  CREATININE 0.94  --   --  0.90  --     Estimated Creatinine Clearance: 83.6 mL/min (by C-G formula based on SCr of 0.9 mg/dL).   Medical History: Past Medical History:  Diagnosis Date   Arthritis    Basal cell carcinoma 05/18/2021   nod-mid forehead (MO[facility])   GERD (gastroesophageal reflux disease)    Hyperthyroidism    Peripheral vascular disease (HCC)    Pneumonia     Medications:  Medications Prior to Admission  Medication Sig Dispense Refill Last Dose   acetaminophen (TYLENOL) 325 MG tablet Take 650 mg by mouth every 6 (six) hours as needed for moderate pain.   Past Week   Ascorbic Acid (VITAMIN C) 1000 MG tablet Take 1,000 mg by mouth daily.   01/03/2022   calcium carbonate (TUMS - DOSED IN MG ELEMENTAL CALCIUM) 500 MG chewable tablet Chew 1 tablet by mouth daily.   01/03/2022   fexofenadine (ALLEGRA) 180 MG tablet Take by mouth.   01/03/2022   fish oil-omega-3 fatty acids 1000 MG capsule Take 1 g by mouth daily.   08/03/8279   folic acid (FOLVITE) 1 MG tablet Take 1 mg by mouth daily.   01/03/2022   levothyroxine (SYNTHROID) 50 MCG tablet Take 1 tablet (50 mcg total) by mouth  daily before breakfast. 30 tablet 1 01/03/2022   magnesium oxide (MAG-OX) 400 MG tablet Take 400 mg by mouth daily.   01/03/2022   midodrine (PROAMATINE) 10 MG tablet Take 1 tablet (10 mg total) by mouth 3 (three) times daily with meals.   01/03/2022   oxyCODONE (OXY IR/ROXICODONE) 5 MG immediate release tablet Take 5-10 mg by mouth every 4 (four) hours as needed for severe pain.   01/03/2022   pantoprazole (PROTONIX) 40 MG tablet Take 1 tablet (40 mg total) by mouth 2 (two) times daily before a meal.   01/03/2022   potassium chloride (KLOR-CON M) 10 MEQ tablet Take 10 mEq by mouth daily.   01/03/2022   thiamine 250 MG tablet Take 250 mg by mouth daily.   01/03/2022   torsemide (DEMADEX) 20 MG tablet Take 20 mg by mouth daily.   01/03/2022   vitamin B-12 (CYANOCOBALAMIN) 250 MCG tablet Take 250 mcg by mouth daily.   01/03/2022   Vitamin D, Ergocalciferol, (DRISDOL) 1.25 MG (50000 UNIT) CAPS capsule Take 50,000 Units by mouth every 7 (seven) days. Sundays  01/03/2022   vitamin E 200 UNIT capsule Take 400 Units by mouth daily.   01/03/2022   Scheduled:   calcium carbonate  1 tablet Oral Daily   folic acid  1 mg Oral Daily   lactulose  10 g Oral Daily   levothyroxine  50 mcg Oral QAC breakfast   midodrine  10 mg Oral TID WC   pantoprazole  40 mg Oral BID AC   thiamine  250 mg Oral Daily   vitamin B-12  250 mcg Oral Daily   Infusions:   heparin 1,550 Units/hr (01/05/22 0217)   PRN:  Anti-infectives (From admission, onward)    None       Assessment: Kristie Brooks a 56 y.o. female requires anticoagulation with a heparin iv infusion for the indication of  DVT. Heparin gtt will be started following pharmacy protocol per pharmacy consult. Patient is not on previous oral anticoagulant that will require aPTT/HL correlation before transitioning to only HL monitoring.   Hgb 8.5 at start of heparin consult, will need to monitor closely   HL 0.34, therapeutic   Goal of Therapy:  Heparin level 0.3-0.7  units/ml Monitor platelets by anticoagulation protocol: Yes   Plan:  Continue heparin infusion at 1550 units/hr Confirm anti-Xa level in 6 hours and daily while on heparin Continue to monitor H&H and platelets  Kristie Brooks 01/05/2022,8:36 AM

## 2022-01-05 NOTE — Plan of Care (Signed)

## 2022-01-05 NOTE — Plan of Care (Signed)

## 2022-01-05 NOTE — Consult Note (Signed)
[facility] Nurse wound consult note  Reason for Consult: bilateral leg ulcers Wound type: Venous ulcerations; long term history x 3+ years. Followed at the [facility] wound care clinic by Dr. Julianne Rice.  Noted currently using Medihoney in the SNF, we do not carry this product inpatient. Will substitute product for exudate management. She can return to [place] for LE wounds per SNF and Dr. 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 Secure with kerlix and use 4" ACE wraps from toes to knees. Change daily.  Elevate legs as much as possible.  Follow up with Dr. Julianne Rice as scheduled at the [facility]  Discussed [place] with patient and bedside nurse.  Re consult if needed, will not follow at this time. Thanks  Hanae Waiters [employer], RN,CWOCN, CNS, [place] [phone])

## 2022-01-05 NOTE — Progress Notes (Signed)
Patient had right sided paracentesis earlier today. Leg dressings were taken off per MD request so he could assess and then dressings reapplied. Patient was given one run of potassium chloride IV and also given PO. Patient has husband at bedside and is washing up and brushing her teeth before eating dinner. No complaints of pain at this time.

## 2022-01-06 DIAGNOSIS — K7031 Alcoholic cirrhosis of liver with ascites: Secondary | ICD-10-CM | POA: Diagnosis not present

## 2022-01-06 DIAGNOSIS — I82621 Acute embolism and thrombosis of deep veins of right upper extremity: Secondary | ICD-10-CM | POA: Diagnosis not present

## 2022-01-06 LAB — BASIC METABOLIC PANEL
Anion gap: 9 (ref 5–15)
Anion gap: 10 (ref 5–15)
BUN: 13 mg/dL (ref 6–20)
BUN: 13 mg/dL (ref 6–20)
CO2: 30 mmol/L (ref 22–32)
CO2: 29 mmol/L (ref 22–32)
Calcium: 7.7 mg/dL — ABNORMAL LOW (ref 8.9–10.3)
Calcium: 7.9 mg/dL — ABNORMAL LOW (ref 8.9–10.3)
Chloride: 100 mmol/L (ref 98–111)
Chloride: 97 mmol/L — ABNORMAL LOW (ref 98–111)
Creatinine, Ser: 1.01 mg/dL — ABNORMAL HIGH (ref 0.44–1.00)
Creatinine, Ser: 1.04 mg/dL — ABNORMAL HIGH (ref 0.44–1.00)
GFR, Estimated: >60 mL/min (ref >60)
GFR, Estimated: >60 mL/min (ref >60)
Glucose, Bld: 76 mg/dL (ref 70–99)
Glucose, Bld: 115 mg/dL — ABNORMAL HIGH (ref 70–99)
Potassium: 2.7 mmol/L — CL (ref 3.5–5.1)
Potassium: 3.2 mmol/L — ABNORMAL LOW (ref 3.5–5.1)
Sodium: 139 mmol/L (ref 135–145)
Sodium: 136 mmol/L (ref 135–145)

## 2022-01-06 LAB — CBC
HCT: 26.4 % — ABNORMAL LOW (ref 36.0–46.0)
Hemoglobin: 8.8 g/dL — ABNORMAL LOW (ref 12.0–15.0)
MCH: 31 pg (ref 26.0–34.0)
MCHC: 33.3 g/dL (ref 30.0–36.0)
MCV: 93 fL (ref 80.0–100.0)
Platelets: 110 10*3/uL — ABNORMAL LOW (ref 150–400)
RBC: 2.84 MIL/uL — ABNORMAL LOW (ref 3.87–5.11)
RDW: 19 % — ABNORMAL HIGH (ref 11.5–15.5)
WBC: 3.1 10*3/uL — ABNORMAL LOW (ref 4.0–10.5)
nRBC: 0 % (ref 0.0–0.2)

## 2022-01-06 LAB — HEPARIN LEVEL (UNFRACTIONATED): Heparin Unfractionated: 0.26 IU/mL — ABNORMAL LOW (ref 0.30–0.70)

## 2022-01-06 LAB — MAGNESIUM: Magnesium: 1.5 mg/dL — ABNORMAL LOW (ref 1.7–2.4)

## 2022-01-06 MED ORDER — OXYCODONE HCL 5 MG PO TABS: 5.0000 mg | ORAL_TABLET | ORAL | 0 refills | Status: DC | PRN

## 2022-01-06 MED ORDER — POTASSIUM CHLORIDE CRYS ER 20 MEQ PO TBCR
20.0000 meq | EXTENDED_RELEASE_TABLET | Freq: Every day | ORAL | Status: DC
  Administered 2022-01-06: 20 meq via ORAL
  Filled 2022-01-06: qty 1

## 2022-01-06 MED ORDER — POTASSIUM CHLORIDE CRYS ER 20 MEQ PO TBCR
40.0000 meq | EXTENDED_RELEASE_TABLET | Freq: Every day | ORAL | Status: DC
Start: 2022-01-06 — End: 2022-01-06

## 2022-01-06 MED ORDER — APIXABAN 5 MG PO TABS: 5.0000 mg | ORAL_TABLET | Freq: Two times a day (BID) | ORAL | Status: DC

## 2022-01-06 MED ORDER — POTASSIUM CHLORIDE 10 MEQ/100ML IV SOLN
10.0000 meq | INTRAVENOUS | Status: AC
  Administered 2022-01-06 (×2): 10 meq via INTRAVENOUS
  Filled 2022-01-06: qty 100

## 2022-01-06 MED ORDER — POTASSIUM CHLORIDE 10 MEQ/100ML IV SOLN: 10.0000 meq | INTRAVENOUS | Status: DC

## 2022-01-06 MED ORDER — POTASSIUM CHLORIDE CRYS ER 20 MEQ PO TBCR
40.0000 meq | EXTENDED_RELEASE_TABLET | Freq: Once | ORAL | Status: AC
  Administered 2022-01-06: 40 meq via ORAL
  Filled 2022-01-06: qty 2

## 2022-01-06 MED ORDER — APIXABAN 5 MG PO TABS: 10.0000 mg | ORAL_TABLET | Freq: Two times a day (BID) | ORAL | 0 refills | Status: DC

## 2022-01-06 MED ORDER — POTASSIUM CHLORIDE CRYS ER 20 MEQ PO TBCR: 20.0000 meq | EXTENDED_RELEASE_TABLET | Freq: Every day | ORAL | Status: DC

## 2022-01-06 MED ORDER — LACTULOSE 10 GM/15ML PO SOLN: 10.0000 g | Freq: Every day | ORAL | 0 refills | Status: DC

## 2022-01-06 MED ORDER — APIXABAN 5 MG PO TABS
10.0000 mg | ORAL_TABLET | Freq: Two times a day (BID) | ORAL | Status: DC
  Administered 2022-01-06 (×2): 10 mg via ORAL
  Filled 2022-01-06 (×2): qty 2

## 2022-01-06 MED ORDER — MAGNESIUM SULFATE 2 GM/50ML IV SOLN
2.0000 g | Freq: Once | INTRAVENOUS | Status: AC
  Administered 2022-01-06: 2 g via INTRAVENOUS
  Filled 2022-01-06: qty 50

## 2022-01-06 MED ORDER — POTASSIUM CHLORIDE 10 MEQ/100ML IV SOLN
10.0000 meq | INTRAVENOUS | Status: AC
  Administered 2022-01-06: 10 meq via INTRAVENOUS
  Filled 2022-01-06 (×2): qty 100

## 2022-01-06 MED ORDER — POTASSIUM CHLORIDE CRYS ER 20 MEQ PO TBCR
40.0000 meq | EXTENDED_RELEASE_TABLET | Freq: Once | ORAL | Status: AC
Start: 2022-01-06 — End: 2022-01-06
  Administered 2022-01-06: 40 meq via ORAL
  Filled 2022-01-06: qty 2

## 2022-01-06 NOTE — TOC Progression Note (Signed)
Transition of Care [facility]) - Progression Note    Patient Details  Name: Kristie Brooks MRN: [id number] Date of Birth: 16-Feb-1966  [facility] CM/SW Contact  Ihor Gully, LCSW Phone Number: 01/06/2022, 12:04 PM  Clinical Narrative:    Ebony Hail at [facility] has authorization.    Expected Discharge Plan: [place] Barriers to Discharge: Continued Medical Work up  Expected Discharge Plan and Services Expected Discharge Plan: [place] Choice: [place] arrangements for the past 2 months: [place]                                       Social Determinants of Health (SDOH) Interventions    Readmission Risk Interventions Readmission Risk Prevention Plan 12/24/2021 12/17/2021  Post Dischage Appt Complete -  Medication Screening Complete Complete  Transportation Screening Complete Complete  Some recent data might be hidden

## 2022-01-06 NOTE — Discharge Summary (Signed)
Physician Discharge Summary  Kristie Brooks KCL:[id number] DOB: 1966/03/25 DOA: 01/04/2022  PCP: Keane Police, MD  Admit date: 01/04/2022 Discharge date: 01/06/2022  Admitted From: SNF Disposition: SNF  Recommendations for Outpatient Follow-up:  Follow up with PCP in 1-2 weeks Please obtain BMP/CBC and mag in one week    Equipment/Devices: 2L [place]  Discharge Condition: Stable CODE STATUS:FULL Diet recommendation: Heart Healthy    Brief/Interim Summary: 56 year old female with a history of liver cirrhosis, chronic respiratory failure on 2 L, hypothyroidism, venous stasis dermatitis of the lower extremities presenting with right upper extremity discomfort.  The patient was recently discharged from the hospital after a stay from 12/15/2021 to 12/26/2021 during which she was treated for cellulitis of the lower extremities and sepsis.  She was discharged with 3 additional days of Bactrim DS.  The patient had intolerance to doxycycline. During that hospitalization, the patient had a PICC line in the right upper extremity.  During the hospitalization, the patient did not have any edema or pain in the right upper extremity.  However 1 day after returning to her SNF, the patient complained of discomfort in her right upper extremity.  Venous duplex of the right upper extremity was obtained and showed acute DVT in the right IJ, subclavian, axillary, and basilic veins.  Patient states that she has never had a DVT in the past.  She denies any fevers, chills, chest pain, shortness breath, cough, hemoptysis, headache, neck pain.  The patient was started on intravenous heparin and admitted for further evaluation and treatment.  Discharge Diagnoses:  Acute right upper extremity DVT -Provoked by clinical history -Patient had a right upper extremity PICC line last hospital admission -started on IV heparin>>apixaban -plan 3 months apixaban -Hgb remained stable   Liver cirrhosis with  ascites -01/05/22 paracentesis 3L removed -Continue home dose torsemide -Patient refusing to take lactulose   Venous stasis dermatitis -Wound care consult---Cut to fit calcium alginate and place over wounds, top with ABD pad. Secure with kerlix and use 4" ACE wraps from toes to knees. Change daily.  Elevate legs as much as possible.  Follow up with Dr. Julianne Rice as scheduled at the [facility]   Hypothyroidism -Continue Synthroid   Hypokalemia/hypomagnesemia -Repleted -increase daily KCl to 20 mEQ -given IV mag and restarted po mag ox   Class II obesity -BMI of 38.27 -Lifestyle modification   Chronic respiratory failure with hypoxia -She is chronically on 2 L -Stable presently   Hypotension -The patient has soft blood pressure at the baseline -Continue midodrine     Discharge Instructions   Allergies as of 01/06/2022       Reactions   Dakin's [sodium Hypochlorite] Dermatitis   Zosyn [piperacillin Sod-tazobactam So] Other (See Comments)   Blood count dropped        Medication List     TAKE these medications    acetaminophen 325 MG tablet Commonly known as: TYLENOL Take 650 mg by mouth every 6 (six) hours as needed for moderate pain.   apixaban 5 MG Tabs tablet Commonly known as: ELIQUIS Take 2 tablets (10 mg total) by mouth 2 (two) times daily. Then 1 tab (5 mg) two times daily starting 01/13/22   calcium carbonate 500 MG chewable tablet Commonly known as: TUMS - dosed in mg elemental calcium Chew 1 tablet by mouth daily.   fexofenadine 180 MG tablet Commonly known as: ALLEGRA Take by mouth.   fish oil-omega-3 fatty acids 1000 MG capsule Take 1 g by mouth daily.  folic acid 1 MG tablet Commonly known as: FOLVITE Take 1 mg by mouth daily.   lactulose 10 GM/15ML solution Commonly known as: CHRONULAC Take 15 mLs (10 g total) by mouth daily.   levothyroxine 50 MCG tablet Commonly known as: SYNTHROID Take 1 tablet (50 mcg total) by mouth daily  before breakfast.   magnesium oxide 400 MG tablet Commonly known as: MAG-OX Take 400 mg by mouth daily.   midodrine 10 MG tablet Commonly known as: PROAMATINE Take 1 tablet (10 mg total) by mouth 3 (three) times daily with meals.   oxyCODONE 5 MG immediate release tablet Commonly known as: Oxy IR/ROXICODONE Take 1 tablet (5 mg total) by mouth every 4 (four) hours as needed for moderate pain. What changed:  how much to take reasons to take this   pantoprazole 40 MG tablet Commonly known as: PROTONIX Take 1 tablet (40 mg total) by mouth 2 (two) times daily before a meal.   potassium chloride SA 20 MEQ tablet Commonly known as: KLOR-CON M Take 1 tablet (20 mEq total) by mouth daily. Start taking on: January 07, 2022 What changed:  medication strength how much to take   thiamine 250 MG tablet Take 250 mg by mouth daily.   torsemide 20 MG tablet Commonly known as: DEMADEX Take 20 mg by mouth daily.   vitamin B-12 250 MCG tablet Commonly known as: CYANOCOBALAMIN Take 250 mcg by mouth daily.   vitamin C 1000 MG tablet Take 1,000 mg by mouth daily.   Vitamin D (Ergocalciferol) 1.25 MG (50000 UNIT) Caps capsule Commonly known as: DRISDOL Take 50,000 Units by mouth every 7 (seven) days. Sundays   vitamin E 200 UNIT capsule Take 400 Units by mouth daily.        Contact information for after-discharge care     [place] .   Service: Skilled Nursing Contact information: [address] [place] [phone]                    Allergies  Allergen Reactions   Dakin's [Sodium Hypochlorite] Dermatitis   Zosyn [Piperacillin Sod-Tazobactam So] Other (See Comments)    Blood count dropped    Consultations: none   Procedures/Studies: CT PELVIS W CONTRAST  Result Date: 12/16/2021 CLINICAL DATA:  Soft tissue infection EXAM: CT PELVIS WITH CONTRAST TECHNIQUE: Multidetector CT  imaging of the pelvis was performed using the standard protocol following the bolus administration of intravenous contrast. CONTRAST:  159mL OMNIPAQUE IOHEXOL 300 MG/ML  SOLN COMPARISON:  None. FINDINGS: Urinary Tract:  No abnormality visualized. Bowel: Unremarkable visualized pelvic bowel loops. There is large volume ascites present within the pelvis. Vascular/Lymphatic: A massive varix is seen extending from the umbilicus to the left common iliac vein, likely representing a portosystemic collateral in the setting of portal venous hypertension. No pathologic adenopathy within the abdomen and pelvis. Reproductive: Involuted fibroids noted within the uterus. The pelvic organs are otherwise unremarkable. Other: There is extensive diffuse subcutaneous body wall noted within the visualized abdominal wall and flanks bilaterally. Musculoskeletal: No acute bone abnormality. No lytic or blastic bone lesion. IMPRESSION: Marked diffuse subcutaneous body wall edema. Large volume ascites. Massive varix extending from the umbilicus the left common iliac vein. Together, the findings are most in keeping with changes of portal venous hypertension. Electronically Signed   By: Fidela Salisbury M.D.   On: 12/16/2021 02:16   US Paracentesis  Result Date: 01/05/2022 INDICATION: Recurrent ascites  Cirrhosis EXAM: ULTRASOUND GUIDED RLQ PARACENTESIS MEDICATIONS: 10 cc 1% lidocaine. COMPLICATIONS: None immediate. PROCEDURE: Informed written consent was obtained from the patient after a discussion of the risks, benefits and alternatives to treatment. A timeout was performed prior to the initiation of the procedure. Initial ultrasound scanning demonstrates a large amount of ascites within the right lower abdominal quadrant. The right lower abdomen was prepped and draped in the usual sterile fashion. 1% lidocaine was used for local anesthesia. Following this, a Yueh catheter was introduced. An ultrasound image was saved for documentation  purposes. The paracentesis was performed. The catheter was removed and a dressing was applied. The patient tolerated the procedure well without immediate post procedural complication. FINDINGS: A total of approximately 3 liters of yellow fluid was removed. IMPRESSION: Successful ultrasound-guided paracentesis yielding 3 liters of peritoneal fluid. Read by Lavonia Drafts [facility] Electronically Signed   By: Lavonia Dana M.D.   On: 01/05/2022 11:07   US Paracentesis  Result Date: 12/21/2021 INDICATION: Ascites.  Hepatitis-C.  Cirrhosis. EXAM: ULTRASOUND GUIDED  PARACENTESIS MEDICATIONS: None. COMPLICATIONS: None immediate. PROCEDURE: Informed written consent was obtained from the patient after a discussion of the risks (including hemorrhage and infection, among others), benefits and alternatives to treatment. A timeout was performed prior to the initiation of the procedure. Initial ultrasound scanning demonstrates a large amount of ascites within the right lower abdominal quadrant. The right lower abdomen was prepped and draped in the usual sterile fashion. 1% lidocaine was used for local anesthesia. An ultrasound image was saved for documentation purposes. Following this, a Yueh catheter was introduced. The paracentesis was performed. The catheter was removed and a dressing was applied. The patient tolerated the procedure well without immediate post procedural complication. FINDINGS: A total of approximately 4 L of straw-colored fluid was removed. Samples were sent to the laboratory as requested by the clinical team. IMPRESSION: Successful ultrasound-guided paracentesis yielding 4 liters of peritoneal fluid. Electronically Signed   By: Van Clines M.D.   On: 12/21/2021 14:09   DG Chest Port 1 View  Result Date: 12/15/2021 CLINICAL DATA:  Fever and lower extremity swelling. EXAM: PORTABLE CHEST 1 VIEW COMPARISON:  November 10, 2021 FINDINGS: Low lung volumes are seen. Mild to moderate severity areas of  atelectasis and/or infiltrate are seen within the bilateral lung bases. There is a small left pleural effusion. No pneumothorax is identified. The heart size and mediastinal contours are within normal limits. Degenerative changes are noted throughout the thoracic spine. IMPRESSION: 1. Low lung volumes with mild to moderate severity bibasilar atelectasis and/or infiltrate. 2. Small left pleural effusion. Electronically Signed   By: Virgina Norfolk M.D.   On: 12/15/2021 22:47   ECHOCARDIOGRAM COMPLETE  Result Date: 12/16/2021    ECHOCARDIOGRAM REPORT   Patient Name:   Kristie Brooks Date of Exam: 12/16/2021 Medical Rec #:  [id number]           Height:       65.0 in Accession #:    [id number]          Weight:       290.0 lb Date of Birth:  1966-11-22           BSA:          2.317 m Patient Age:    56 years            BP:           73/36 mmHg Patient Gender: F  HR:           106 bpm. Exam Location:  [facility] Procedure: 2D Echo, Cardiac Doppler and Color Doppler Indications:    Edema  History:        Patient has no prior history of Echocardiogram examinations.                 Signs/Symptoms:Edema.  Sonographer:    Wenda Low Referring Phys: [id number] ASIA B Dalton  Sonographer Comments: Patient is morbidly obese and no subcostal window. IMPRESSIONS  1. Left ventricular ejection fraction, by estimation, is 65 to 70%. The left ventricle has normal function. The left ventricle has no regional wall motion abnormalities. The left ventricular internal cavity size was mildly dilated. There is mild left ventricular hypertrophy. Left ventricular diastolic parameters were normal.  2. Right ventricular systolic function is normal. The right ventricular size is normal. There is normal pulmonary artery systolic pressure.  3. The mitral valve is normal in structure. Trivial mitral valve regurgitation.  4. The aortic valve is tricuspid. Aortic valve regurgitation is not visualized. Aortic valve  sclerosis is present, with no evidence of aortic valve stenosis. FINDINGS  Left Ventricle: Left ventricular ejection fraction, by estimation, is 65 to 70%. The left ventricle has normal function. The left ventricle has no regional wall motion abnormalities. The left ventricular internal cavity size was mildly dilated. There is  mild left ventricular hypertrophy. Left ventricular diastolic parameters were normal. Right Ventricle: The right ventricular size is normal. Right vetricular wall thickness was not assessed. Right ventricular systolic function is normal. There is normal pulmonary artery systolic pressure. The tricuspid regurgitant velocity is 2.48 m/s, and with an assumed right atrial pressure of 8 mmHg, the estimated right ventricular systolic pressure is 26.3 mmHg. Left Atrium: Left atrial size was normal in size. Right Atrium: Right atrial size was normal in size. Pericardium: There is no evidence of pericardial effusion. Mitral Valve: The mitral valve is normal in structure. Trivial mitral valve regurgitation. MV peak gradient, 7.2 mmHg. The mean mitral valve gradient is 4.0 mmHg. Tricuspid Valve: The tricuspid valve is normal in structure. Tricuspid valve regurgitation is trivial. Aortic Valve: The aortic valve is tricuspid. Aortic valve regurgitation is not visualized. Aortic valve sclerosis is present, with no evidence of aortic valve stenosis. Aortic valve mean gradient measures 8.0 mmHg. Aortic valve peak gradient measures 16.6 mmHg. Aortic valve area, by VTI measures 2.25 cm. Pulmonic Valve: The pulmonic valve was normal in structure. Pulmonic valve regurgitation is not visualized. Aorta: The aortic root is normal in size and structure. IAS/Shunts: No atrial level shunt detected by color flow Doppler.  LEFT VENTRICLE PLAX 2D LVIDd:         5.20 cm     Diastology LVIDs:         3.20 cm     LV e' medial:    13.40 cm/s LV PW:         1.10 cm     LV E/e' medial:  8.0 LV IVS:        1.00 cm     LV e'  lateral:   16.80 cm/s LVOT diam:     2.00 cm     LV E/e' lateral: 6.4 LV SV:         100 LV SV Index:   43 LVOT Area:     3.14 cm  LV Volumes (MOD) LV vol d, MOD A2C: 75.0 ml LV vol d, MOD A4C: 69.1 ml LV vol  s, MOD A2C: 32.3 ml LV vol s, MOD A4C: 27.0 ml LV SV MOD A2C:     42.7 ml LV SV MOD A4C:     69.1 ml LV SV MOD BP:      42.2 ml RIGHT VENTRICLE RV Basal diam:  3.30 cm RV Mid diam:    4.00 cm RV S prime:     19.20 cm/s TAPSE (M-mode): 3.1 cm LEFT ATRIUM             Index        RIGHT ATRIUM           Index LA diam:        4.50 cm 1.94 cm/m   RA Area:     17.50 cm LA Vol (A2C):   48.1 ml 20.76 ml/m  RA Volume:   41.20 ml  17.79 ml/m LA Vol (A4C):   92.3 ml 39.84 ml/m LA Biplane Vol: 71.3 ml 30.78 ml/m  AORTIC VALVE                     PULMONIC VALVE AV Area (Vmax):    2.39 cm      PV Vmax:       1.21 m/s AV Area (Vmean):   2.65 cm      PV Peak grad:  5.9 mmHg AV Area (VTI):     2.25 cm AV Vmax:           204.00 cm/s AV Vmean:          128.000 cm/s AV VTI:            0.444 m AV Peak Grad:      16.6 mmHg AV Mean Grad:      8.0 mmHg LVOT Vmax:         155.00 cm/s LVOT Vmean:        108.000 cm/s LVOT VTI:          0.318 m LVOT/AV VTI ratio: 0.72  AORTA Ao Root diam: 2.80 cm MITRAL VALVE                TRICUSPID VALVE MV Area (PHT): 3.13 cm     TR Peak grad:   24.6 mmHg MV Area VTI:   3.86 cm     TR Vmax:        248.00 cm/s MV Peak grad:  7.2 mmHg MV Mean grad:  4.0 mmHg     SHUNTS MV Vmax:       1.34 m/s     Systemic VTI:  0.32 m MV Vmean:      88.2 cm/s    Systemic Diam: 2.00 cm MV Decel Time: 242 msec MV E velocity: 107.00 cm/s MV A velocity: 89.40 cm/s MV E/A ratio:  1.20 Dorris Carnes MD Electronically signed by Dorris Carnes MD Signature Date/Time: 12/16/2021/3:38:01 PM    Final    US LIVER DOPPLER  Result Date: 12/21/2021 CLINICAL DATA:  56 year old female with cirrhosis EXAM: DUPLEX ULTRASOUND OF LIVER TECHNIQUE: Color and duplex Doppler ultrasound was performed to evaluate the hepatic in-flow and  out-flow vessels. COMPARISON:  None. FINDINGS: Portal Vein Velocities Main:  58 cm/sec Right:  57 cm/sec Left: Not visualized Hepatic Vein Velocities Right:  34 cm/sec Middle: Not visualized Left:  60 cm/sec Hepatic Artery Velocity:  78 cm/sec Splenic Vein Velocity:  59 cm/sec Varices: Present Ascites: Present Recanalized umbilical vein. Cirrhotic morphology of the liver IMPRESSION: Cirrhotic morphology of the liver with stigmata of portal  hypertension including recanalized umbilical vein, additional varices, and ascites Signed, Dulcy Fanny. Dellia Nims, RPVI [facility] Radiology Electronically Signed   By: Corrie Mckusick D.O.   On: 12/21/2021 12:23   CT EXTREM LOWER W CM BIL  Result Date: 12/16/2021 CLINICAL DATA:  Soft tissue infection of the lower extremities bilaterally EXAM: CT OF THE LOWER BILATERAL EXTREMITY WITH CONTRAST TECHNIQUE: Multidetector CT imaging of the lower bilateral extremity was performed with axial images obtained from the acetabular roofs through the proximal tibial metadiaphysis following intravenous contrast administration. COMPARISON:  None. CONTRAST:  111mL OMNIPAQUE IOHEXOL 300 MG/ML  SOLN FINDINGS: Bones/Joint/Cartilage Normal alignment. No acute fracture or dislocation. No osseous erosion or abnormal periosteal reaction. 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 Ligaments Suboptimally assessed by CT. Muscles and Tendons Unremarkable Soft tissues There is marked diffuse subcutaneous edema involving the lower extremities bilaterally circumferentially. Edematous changes extend into the inter fascial planes of the anterior, posterior, and medial muscular compartments of the thighs bilaterally. No discrete drainable fluid collection identified. Small bilateral knee effusions are present. Extensive subcutaneous edema is noted within the visualized pannus bilaterally. Large volume ascites is present within  the visualized pelvis. IMPRESSION: Extensive subcutaneous edema involving the lower extremities bilaterally and pannus. Infiltrative changes extend into the muscular compartments of the thighs bilaterally. No discrete drainable fluid collection is identified, however. Small bilateral knee effusions. Large volume ascites. Electronically Signed   By: Fidela Salisbury M.D.   On: 12/16/2021 02:12   [place] EKG SITE RITE  Result Date: 12/16/2021 If Site Rite image not attached, placement could not be confirmed due to current cardiac rhythm.       Discharge Exam: Vitals:   01/06/22 0500 01/06/22 1248  BP: (!) 105/52 112/60  Pulse: 74 73  Resp: 16 18  Temp: 98 F (36.7 C) 98.2 F (36.8 C)  SpO2: 91% 94%   Vitals:   01/05/22 1345 01/05/22 2252 01/06/22 0500 01/06/22 1248  BP: (!) 103/57 (!) 105/52 (!) 105/52 112/60  Pulse: 85 81 74 73  Resp: 14 20 16 18   Temp: 98.4 F (36.9 C) 98.4 F (36.9 C) 98 F (36.7 C) 98.2 F (36.8 C)  TempSrc: Oral Oral Oral Oral  SpO2: 95% 91% 91% 94%  Weight:      Height:        General: Pt is alert, awake, not in acute distress Cardiovascular: RRR, S1/S2 +, no rubs, no gallops Respiratory: CTA bilaterally, no wheezing, no rhonchi Abdominal: Soft, NT, ND, bowel sounds + Extremities: 1 +LE  edema, no cyanosis   The results of significant diagnostics from this hospitalization (including imaging, microbiology, ancillary and laboratory) are listed below for reference.    Significant Diagnostic Studies: CT PELVIS W CONTRAST  Result Date: 12/16/2021 CLINICAL DATA:  Soft tissue infection EXAM: CT PELVIS WITH CONTRAST TECHNIQUE: Multidetector CT imaging of the pelvis was performed using the standard protocol following the bolus administration of intravenous contrast. CONTRAST:  119mL OMNIPAQUE IOHEXOL 300 MG/ML  SOLN COMPARISON:  None. FINDINGS: Urinary Tract:  No abnormality visualized. Bowel: Unremarkable visualized pelvic bowel loops. There is large volume  ascites present within the pelvis. Vascular/Lymphatic: A massive varix is seen extending from the umbilicus to the left common iliac vein, likely representing a portosystemic collateral in the setting of portal venous hypertension. No pathologic adenopathy within the abdomen and pelvis. Reproductive: Involuted fibroids noted within the uterus. The pelvic organs are otherwise unremarkable. Other: There is extensive  diffuse subcutaneous body wall noted within the visualized abdominal wall and flanks bilaterally. Musculoskeletal: No acute bone abnormality. No lytic or blastic bone lesion. IMPRESSION: Marked diffuse subcutaneous body wall edema. Large volume ascites. Massive varix extending from the umbilicus the left common iliac vein. Together, the findings are most in keeping with changes of portal venous hypertension. Electronically Signed   By: Fidela Salisbury M.D.   On: 12/16/2021 02:16   US Paracentesis  Result Date: 01/05/2022 INDICATION: Recurrent ascites Cirrhosis EXAM: ULTRASOUND GUIDED RLQ PARACENTESIS MEDICATIONS: 10 cc 1% lidocaine. COMPLICATIONS: None immediate. PROCEDURE: Informed written consent was obtained from the patient after a discussion of the risks, benefits and alternatives to treatment. A timeout was performed prior to the initiation of the procedure. Initial ultrasound scanning demonstrates a large amount of ascites within the right lower abdominal quadrant. The right lower abdomen was prepped and draped in the usual sterile fashion. 1% lidocaine was used for local anesthesia. Following this, a Yueh catheter was introduced. An ultrasound image was saved for documentation purposes. The paracentesis was performed. The catheter was removed and a dressing was applied. The patient tolerated the procedure well without immediate post procedural complication. FINDINGS: A total of approximately 3 liters of yellow fluid was removed. IMPRESSION: Successful ultrasound-guided paracentesis yielding 3  liters of peritoneal fluid. Read by Lavonia Drafts [facility] Electronically Signed   By: Lavonia Dana M.D.   On: 01/05/2022 11:07   US Paracentesis  Result Date: 12/21/2021 INDICATION: Ascites.  Hepatitis-C.  Cirrhosis. EXAM: ULTRASOUND GUIDED  PARACENTESIS MEDICATIONS: None. COMPLICATIONS: None immediate. PROCEDURE: Informed written consent was obtained from the patient after a discussion of the risks (including hemorrhage and infection, among others), benefits and alternatives to treatment. A timeout was performed prior to the initiation of the procedure. Initial ultrasound scanning demonstrates a large amount of ascites within the right lower abdominal quadrant. The right lower abdomen was prepped and draped in the usual sterile fashion. 1% lidocaine was used for local anesthesia. An ultrasound image was saved for documentation purposes. Following this, a Yueh catheter was introduced. The paracentesis was performed. The catheter was removed and a dressing was applied. The patient tolerated the procedure well without immediate post procedural complication. FINDINGS: A total of approximately 4 L of straw-colored fluid was removed. Samples were sent to the laboratory as requested by the clinical team. IMPRESSION: Successful ultrasound-guided paracentesis yielding 4 liters of peritoneal fluid. Electronically Signed   By: Van Clines M.D.   On: 12/21/2021 14:09   DG Chest Port 1 View  Result Date: 12/15/2021 CLINICAL DATA:  Fever and lower extremity swelling. EXAM: PORTABLE CHEST 1 VIEW COMPARISON:  November 10, 2021 FINDINGS: Low lung volumes are seen. Mild to moderate severity areas of atelectasis and/or infiltrate are seen within the bilateral lung bases. There is a small left pleural effusion. No pneumothorax is identified. The heart size and mediastinal contours are within normal limits. Degenerative changes are noted throughout the thoracic spine. IMPRESSION: 1. Low lung volumes with mild to moderate  severity bibasilar atelectasis and/or infiltrate. 2. Small left pleural effusion. Electronically Signed   By: Virgina Norfolk M.D.   On: 12/15/2021 22:47   ECHOCARDIOGRAM COMPLETE  Result Date: 12/16/2021    ECHOCARDIOGRAM REPORT   Patient Name:   Kristie Brooks Date of Exam: 12/16/2021 Medical Rec #:  [id number]           Height:       65.0 in Accession #:    [id number]  Weight:       290.0 lb Date of Birth:  09-07-1966           BSA:          2.317 m Patient Age:    55 years            BP:           73/36 mmHg Patient Gender: F                   HR:           106 bpm. Exam Location:  [facility] Procedure: 2D Echo, Cardiac Doppler and Color Doppler Indications:    Edema  History:        Patient has no prior history of Echocardiogram examinations.                 Signs/Symptoms:Edema.  Sonographer:    Wenda Low Referring Phys: [id number] ASIA B [place]  Sonographer Comments: Patient is morbidly obese and no subcostal window. IMPRESSIONS  1. Left ventricular ejection fraction, by estimation, is 65 to 70%. The left ventricle has normal function. The left ventricle has no regional wall motion abnormalities. The left ventricular internal cavity size was mildly dilated. There is mild left ventricular hypertrophy. Left ventricular diastolic parameters were normal.  2. Right ventricular systolic function is normal. The right ventricular size is normal. There is normal pulmonary artery systolic pressure.  3. The mitral valve is normal in structure. Trivial mitral valve regurgitation.  4. The aortic valve is tricuspid. Aortic valve regurgitation is not visualized. Aortic valve sclerosis is present, with no evidence of aortic valve stenosis. FINDINGS  Left Ventricle: Left ventricular ejection fraction, by estimation, is 65 to 70%. The left ventricle has normal function. The left ventricle has no regional wall motion abnormalities. The left ventricular internal cavity size was mildly dilated. There  is  mild left ventricular hypertrophy. Left ventricular diastolic parameters were normal. Right Ventricle: The right ventricular size is normal. Right vetricular wall thickness was not assessed. Right ventricular systolic function is normal. There is normal pulmonary artery systolic pressure. The tricuspid regurgitant velocity is 2.48 m/s, and with an assumed right atrial pressure of 8 mmHg, the estimated right ventricular systolic pressure is 49.7 mmHg. Left Atrium: Left atrial size was normal in size. Right Atrium: Right atrial size was normal in size. Pericardium: There is no evidence of pericardial effusion. Mitral Valve: The mitral valve is normal in structure. Trivial mitral valve regurgitation. MV peak gradient, 7.2 mmHg. The mean mitral valve gradient is 4.0 mmHg. Tricuspid Valve: The tricuspid valve is normal in structure. Tricuspid valve regurgitation is trivial. Aortic Valve: The aortic valve is tricuspid. Aortic valve regurgitation is not visualized. Aortic valve sclerosis is present, with no evidence of aortic valve stenosis. Aortic valve mean gradient measures 8.0 mmHg. Aortic valve peak gradient measures 16.6 mmHg. Aortic valve area, by VTI measures 2.25 cm. Pulmonic Valve: The pulmonic valve was normal in structure. Pulmonic valve regurgitation is not visualized. Aorta: The aortic root is normal in size and structure. IAS/Shunts: No atrial level shunt detected by color flow Doppler.  LEFT VENTRICLE PLAX 2D LVIDd:         5.20 cm     Diastology LVIDs:         3.20 cm     LV e' medial:    13.40 cm/s LV PW:         1.10 cm     LV  E/e' medial:  8.0 LV IVS:        1.00 cm     LV e' lateral:   16.80 cm/s LVOT diam:     2.00 cm     LV E/e' lateral: 6.4 LV SV:         100 LV SV Index:   43 LVOT Area:     3.14 cm  LV Volumes (MOD) LV vol d, MOD A2C: 75.0 ml LV vol d, MOD A4C: 69.1 ml LV vol s, MOD A2C: 32.3 ml LV vol s, MOD A4C: 27.0 ml LV SV MOD A2C:     42.7 ml LV SV MOD A4C:     69.1 ml LV SV MOD BP:       42.2 ml RIGHT VENTRICLE RV Basal diam:  3.30 cm RV Mid diam:    4.00 cm RV S prime:     19.20 cm/s TAPSE (M-mode): 3.1 cm LEFT ATRIUM             Index        RIGHT ATRIUM           Index LA diam:        4.50 cm 1.94 cm/m   RA Area:     17.50 cm LA Vol (A2C):   48.1 ml 20.76 ml/m  RA Volume:   41.20 ml  17.79 ml/m LA Vol (A4C):   92.3 ml 39.84 ml/m LA Biplane Vol: 71.3 ml 30.78 ml/m  AORTIC VALVE                     PULMONIC VALVE AV Area (Vmax):    2.39 cm      PV Vmax:       1.21 m/s AV Area (Vmean):   2.65 cm      PV Peak grad:  5.9 mmHg AV Area (VTI):     2.25 cm AV Vmax:           204.00 cm/s AV Vmean:          128.000 cm/s AV VTI:            0.444 m AV Peak Grad:      16.6 mmHg AV Mean Grad:      8.0 mmHg LVOT Vmax:         155.00 cm/s LVOT Vmean:        108.000 cm/s LVOT VTI:          0.318 m LVOT/AV VTI ratio: 0.72  AORTA Ao Root diam: 2.80 cm MITRAL VALVE                TRICUSPID VALVE MV Area (PHT): 3.13 cm     TR Peak grad:   24.6 mmHg MV Area VTI:   3.86 cm     TR Vmax:        248.00 cm/s MV Peak grad:  7.2 mmHg MV Mean grad:  4.0 mmHg     SHUNTS MV Vmax:       1.34 m/s     Systemic VTI:  0.32 m MV Vmean:      88.2 cm/s    Systemic Diam: 2.00 cm MV Decel Time: 242 msec MV E velocity: 107.00 cm/s MV A velocity: 89.40 cm/s MV E/A ratio:  1.20 Dorris Carnes MD Electronically signed by Dorris Carnes MD Signature Date/Time: 12/16/2021/3:38:01 PM    Final    US LIVER DOPPLER  Result Date: 12/21/2021 CLINICAL DATA:  56 year old female with cirrhosis  EXAM: DUPLEX ULTRASOUND OF LIVER TECHNIQUE: Color and duplex Doppler ultrasound was performed to evaluate the hepatic in-flow and out-flow vessels. COMPARISON:  None. FINDINGS: Portal Vein Velocities Main:  58 cm/sec Right:  57 cm/sec Left: Not visualized Hepatic Vein Velocities Right:  34 cm/sec Middle: Not visualized Left:  60 cm/sec Hepatic Artery Velocity:  78 cm/sec Splenic Vein Velocity:  59 cm/sec Varices: Present Ascites: Present Recanalized  umbilical vein. Cirrhotic morphology of the liver IMPRESSION: Cirrhotic morphology of the liver with stigmata of portal hypertension including recanalized umbilical vein, additional varices, and ascites Signed, Dulcy Fanny. Dellia Nims, RPVI [facility] Electronically Signed   By: Corrie Mckusick D.O.   On: 12/21/2021 12:23   CT EXTREM LOWER W CM BIL  Result Date: 12/16/2021 CLINICAL DATA:  Soft tissue infection of the lower extremities bilaterally EXAM: CT OF THE LOWER BILATERAL EXTREMITY WITH CONTRAST TECHNIQUE: Multidetector CT imaging of the lower bilateral extremity was performed with axial images obtained from the acetabular roofs through the proximal tibial metadiaphysis following intravenous contrast administration. COMPARISON:  None. CONTRAST:  146mL OMNIPAQUE IOHEXOL 300 MG/ML  SOLN FINDINGS: Bones/Joint/Cartilage Normal alignment. No acute fracture or dislocation. No osseous erosion or abnormal periosteal reaction. 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 Ligaments Suboptimally assessed by CT. Muscles and Tendons Unremarkable Soft tissues There is marked diffuse subcutaneous edema involving the lower extremities bilaterally circumferentially. Edematous changes extend into the inter fascial planes of the anterior, posterior, and medial muscular compartments of the thighs bilaterally. No discrete drainable fluid collection identified. Small bilateral knee effusions are present. Extensive subcutaneous edema is noted within the visualized pannus bilaterally. Large volume ascites is present within the visualized pelvis. IMPRESSION: Extensive subcutaneous edema involving the lower extremities bilaterally and pannus. Infiltrative changes extend into the muscular compartments of the thighs bilaterally. No discrete drainable fluid collection is identified, however. Small bilateral knee effusions. Large volume ascites.  Electronically Signed   By: Fidela Salisbury M.D.   On: 12/16/2021 02:12   [place] EKG SITE RITE  Result Date: 12/16/2021 If Site Rite image not attached, placement could not be confirmed due to current cardiac rhythm.   Microbiology: Recent Results (from the past 240 hour(s))  Resp Panel by RT-PCR (Flu A&B, Covid) Nasopharyngeal Swab     Status: None   Collection Time: 01/04/22  9:14 PM   Specimen: Nasopharyngeal Swab; Nasopharyngeal(NP) swabs in vial transport medium  Result Value Ref Range Status   SARS Coronavirus 2 by RT PCR NEGATIVE NEGATIVE Final    Comment: (NOTE) SARS-CoV-2 target nucleic acids are NOT DETECTED.  The SARS-CoV-2 RNA is generally detectable in upper respiratory specimens during the acute phase of infection. The lowest concentration of SARS-CoV-2 viral copies this assay can detect is 138 copies/mL. A negative result does not preclude SARS-Cov-2 infection and should not be used as the sole basis for treatment or other patient management decisions. A negative result may occur with  improper specimen collection/handling, submission of specimen other than nasopharyngeal swab, presence of viral mutation(s) within the areas targeted by this assay, and inadequate number of viral copies(<138 copies/mL). A negative result must be combined with clinical observations, patient history, and epidemiological information. The expected result is Negative.  Fact Sheet for Patients:  EntrepreneurPulse.com.au  Fact Sheet for Healthcare Providers:  IncredibleEmployment.be  This test is no t yet approved or cleared by the [place] FDA and  has been authorized for detection and/or diagnosis of SARS-CoV-2 by  FDA under an Emergency Use Authorization (EUA). This EUA will remain  in effect (meaning this test can be used) for the duration of the COVID-19 declaration under Section 564(b)(1) of the Act, 21 U.S.C.section 360bbb-3(b)(1), unless the  authorization is terminated  or revoked sooner.       Influenza A by PCR NEGATIVE NEGATIVE Final   Influenza B by PCR NEGATIVE NEGATIVE Final    Comment: (NOTE) The Xpert Xpress SARS-CoV-2/FLU/RSV plus assay is intended as an aid in the diagnosis of influenza from Nasopharyngeal swab specimens and should not be used as a sole basis for treatment. Nasal washings and aspirates are unacceptable for Xpert Xpress SARS-CoV-2/FLU/RSV testing.  Fact Sheet for Patients: EntrepreneurPulse.com.au  Fact Sheet for Healthcare Providers: IncredibleEmployment.be  This test is not yet approved or cleared by the [place] FDA and has been authorized for detection and/or diagnosis of SARS-CoV-2 by FDA under an Emergency Use Authorization (EUA). This EUA will remain in effect (meaning this test can be used) for the duration of the COVID-19 declaration under Section 564(b)(1) of the Act, 21 U.S.C. section 360bbb-3(b)(1), unless the authorization is terminated or revoked.  Performed at [facility], [address] [place], [place] [address]      Labs: Basic Metabolic Panel: Recent Labs  Lab 01/04/22 1759 01/05/22 0548 01/06/22 0450 01/06/22 1244  NA 139 137 139 136  K 3.1* 2.9* 2.7* 3.2*  CL 99 100 100 97*  CO2 28 28 30 29   GLUCOSE 85 92 76 115*  BUN 13 13 13 13   CREATININE 0.94 0.90 1.01* 1.04*  CALCIUM 8.2* 7.8* 7.7* 7.9*  MG  --  1.6* 1.5*  --    Liver Function Tests: Recent Labs  Lab 01/04/22 1759 01/05/22 0548  AST 32 30  ALT 10 10  ALKPHOS 101 87  BILITOT 2.3* 1.8*  PROT 5.7* 4.9*  ALBUMIN 3.2* 2.8*   No results for input(s): LIPASE, AMYLASE in the last 168 hours. No results for input(s): AMMONIA in the last 168 hours. CBC: Recent Labs  Lab 01/04/22 1900 01/05/22 0548 01/06/22 0450  WBC 3.8* 3.3* 3.1*  NEUTROABS 2.4 1.9  --   HGB 10.8* 9.0* 8.8*  HCT 32.5* 28.0* 26.4*  MCV 92.1 91.8 93.0  PLT 114* 101* 110*   Cardiac  Enzymes: No results for input(s): CKTOTAL, CKMB, CKMBINDEX, TROPONINI in the last 168 hours. BNP: Invalid input(s): POCBNP CBG: No results for input(s): GLUCAP in the last 168 hours.  Time coordinating discharge:  36 minutes  Signed:  Orson Eva, DO [facility] Pager: [phone] 01/06/2022, 2:38 PM

## 2022-01-06 NOTE — TOC Transition Note (Signed)
Transition of Care [facility]) - CM/SW Discharge Note   Patient Details  Name: Merlina Marchena MRN: [id number] Date of Birth: 16-Nov-1966  [facility] of [facility]) CM/SW Contact:  Ihor Gully, LCSW Phone Number: 01/06/2022, 3:02 PM   Clinical Narrative:    Discharge clinicals sent to facility. EMS called for transport. RN to call report. Mother notified. TOC signing off.      Barriers to Discharge: No Barriers Identified   Patient Goals and CMS Choice Patient states their goals for this hospitalization and ongoing recovery are:: return to rehab      Discharge Placement                Patient to be transferred to facility by: EMS Name of family member notified: Mother Patient and family notified of of transfer: 01/06/22  Discharge Plan and Services     Post Acute Care Choice: [place]                               Social Determinants of Health (SDOH) Interventions     Readmission Risk Interventions Readmission Risk Prevention Plan 12/24/2021 12/17/2021  Post Dischage Appt Complete -  Medication Screening Complete Complete  Transportation Screening Complete Complete  Some recent data might be hidden

## 2022-01-06 NOTE — Progress Notes (Signed)
Report called to [place] at [facility], patient waiting for EMS transport

## 2022-01-06 NOTE — Progress Notes (Signed)
ANTICOAGULATION CONSULT NOTE - Follow-up  Pharmacy Consult for Heparin  Indication: DVT  Allergies  Allergen Reactions   Dakin's [Sodium Hypochlorite] Dermatitis   Zosyn [Piperacillin Sod-Tazobactam So] Other (See Comments)    Blood count dropped    Patient Measurements: Height: 5\' 5"  (165.1 cm) Weight: 104.3 kg (230 lb) IBW/kg (Calculated) : 57 Heparin Dosing Weight: HEPARIN DW (KG): 81.2   Vital Signs: Temp: 98.4 F (36.9 C) (01/11 2252) Temp Source: Oral (01/11 2252) BP: 105/52 (01/11 2252) Pulse Rate: 81 (01/11 2252)  Labs: Recent Labs    01/04/22 1759 01/04/22 1900 01/05/22 0023 01/05/22 0548 01/05/22 0752 01/05/22 1659 01/06/22 0142  HGB  --  10.8*  --  9.0*  --   --   --   HCT  --  32.5*  --  28.0*  --   --   --   PLT  --  114*  --  101*  --   --   --   LABPROT 17.7*  --   --   --   --   --   --   INR 1.5*  --   --   --   --   --   --   HEPARINUNFRC  --   --    < >  --  0.34 0.12* 0.26*  CREATININE 0.94  --   --  0.90  --   --   --    < > = values in this interval not displayed.     Estimated Creatinine Clearance: 83.6 mL/min (by C-G formula based on SCr of 0.9 mg/dL).   Medical History: Past Medical History:  Diagnosis Date   Arthritis    Basal cell carcinoma 05/18/2021   nod-mid forehead ([facility])   GERD (gastroesophageal reflux disease)    Hyperthyroidism    Peripheral vascular disease (HCC)    Pneumonia     Medications:  Medications Prior to Admission  Medication Sig Dispense Refill Last Dose   acetaminophen (TYLENOL) 325 MG tablet Take 650 mg by mouth every 6 (six) hours as needed for moderate pain.   Past Week   Ascorbic Acid (VITAMIN C) 1000 MG tablet Take 1,000 mg by mouth daily.   01/03/2022   calcium carbonate (TUMS - DOSED IN MG ELEMENTAL CALCIUM) 500 MG chewable tablet Chew 1 tablet by mouth daily.   01/03/2022   fexofenadine (ALLEGRA) 180 MG tablet Take by mouth.   01/03/2022   fish oil-omega-3 fatty acids 1000 MG capsule Take 1 g by  mouth daily.   02/24/9517   folic acid (FOLVITE) 1 MG tablet Take 1 mg by mouth daily.   01/03/2022   levothyroxine (SYNTHROID) 50 MCG tablet Take 1 tablet (50 mcg total) by mouth daily before breakfast. 30 tablet 1 01/03/2022   magnesium oxide (MAG-OX) 400 MG tablet Take 400 mg by mouth daily.   01/03/2022   midodrine (PROAMATINE) 10 MG tablet Take 1 tablet (10 mg total) by mouth 3 (three) times daily with meals.   01/03/2022   oxyCODONE (OXY IR/ROXICODONE) 5 MG immediate release tablet Take 5-10 mg by mouth every 4 (four) hours as needed for severe pain.   01/03/2022   pantoprazole (PROTONIX) 40 MG tablet Take 1 tablet (40 mg total) by mouth 2 (two) times daily before a meal.   01/03/2022   potassium chloride (KLOR-CON M) 10 MEQ tablet Take 10 mEq by mouth daily.   01/03/2022   thiamine 250 MG tablet Take 250 mg by mouth daily.  01/03/2022   torsemide (DEMADEX) 20 MG tablet Take 20 mg by mouth daily.   01/03/2022   vitamin B-12 (CYANOCOBALAMIN) 250 MCG tablet Take 250 mcg by mouth daily.   01/03/2022   Vitamin D, Ergocalciferol, (DRISDOL) 1.25 MG (50000 UNIT) CAPS capsule Take 50,000 Units by mouth every 7 (seven) days. Sundays   01/03/2022   vitamin E 200 UNIT capsule Take 400 Units by mouth daily.   01/03/2022   Scheduled:   calcium carbonate  1 tablet Oral Daily   folic acid  1 mg Oral Daily   lactulose  10 g Oral Daily   levothyroxine  50 mcg Oral QAC breakfast   midodrine  10 mg Oral TID WC   pantoprazole  40 mg Oral BID AC   thiamine  250 mg Oral Daily   torsemide  20 mg Oral Daily   vitamin B-12  250 mcg Oral Daily   Infusions:   heparin 1,550 Units/hr (01/05/22 1817)   PRN:  Anti-infectives (From admission, onward)    None       Assessment: Kristie Brooks a 56 y.o. female requires anticoagulation with a heparin iv infusion for the indication of  DVT. Heparin gtt will be started following pharmacy protocol per pharmacy consult. Patient is not on previous oral anticoagulant that will require  aPTT/HL correlation before transitioning to only HL monitoring.   Hgb 8.5 at start of heparin consult, will need to monitor closely   HL 0.12, subtherapeutic. Heparin infusion was stopped at 09:08 on 01/05/2022  1/12 AM update:  Heparin level just below goal  Goal of Therapy:  Heparin level 0.3-0.7 units/ml Monitor platelets by anticoagulation protocol: Yes   Plan:  Inc heparin to 1650 units/hr 1100 heparin level  Narda Bonds, PharmD, BCPS Clinical Pharmacist Phone: [phone]

## 2022-01-06 NOTE — Progress Notes (Signed)
Lab called with a critical potassium value of 2.7. Doran Heater, RN notified.

## 2022-01-06 NOTE — NC FL2 (Signed)
[place] LEVEL OF CARE SCREENING TOOL     IDENTIFICATION  Patient Name: Kristie Brooks Birthdate: July 21, 1966 Sex: female Admission Date (Current Location): 01/04/2022  [facility] and [place] Number:  [employer] and Address:  [place] [address] [place]      Provider Number: [id number]  Attending Physician Name and Address:  Orson Eva, MD  Relative Name and Phone Number:  Raelene Bott (Mother)   [phone]    Current Level of Care: Hospital (observation) Recommended Level of Care: [place] Prior Approval Number:    Date Approved/Denied:   PASRR Number:    Discharge Plan: SNF    Current Diagnoses: Patient Active Problem List   Diagnosis Date Noted   Thyroid disease 01/05/2022   GERD (gastroesophageal reflux disease) 01/05/2022   DVT (deep venous thrombosis) ([place]) 01/04/2022   Sepsis due to undetermined organism ([place]) 12/22/2021   Cellulitis, leg 12/22/2021   Hematemesis    Cirrhosis of liver with ascites ([place])    Ascites    Anasarca    Pressure injury of skin 12/18/2021   Sepsis ([place]) 12/16/2021    Orientation RESPIRATION BLADDER Height & Weight     Self, Time, Situation, Place  Normal Continent Weight: 230 lb (104.3 kg) Height:  5\' 5"  (165.1 cm)  BEHAVIORAL SYMPTOMS/MOOD NEUROLOGICAL BOWEL NUTRITION STATUS      Continent Diet (heart healthy)  AMBULATORY STATUS COMMUNICATION OF NEEDS Skin   Limited Assist Verbally PU Stage and Appropriate Care (Stage 2: buttocks, venous stasis ulcer pretibial right, left, lateral)                       Personal Care Assistance Level of Assistance  Bathing, Feeding, Dressing Bathing Assistance: Limited assistance Feeding assistance: Independent Dressing Assistance: Limited assistance     Functional Limitations Info  Sight, Speech, Hearing Sight Info: Adequate Hearing Info: Adequate Speech Info: Adequate    SPECIAL CARE FACTORS  FREQUENCY  PT (By licensed PT)     PT Frequency: 5x/week              Contractures Contractures Info: Not present    Additional Factors Info  Code Status, Allergies Code Status Info: Full Code Allergies Info: Dakins, Zosyn           Current Medications (01/06/2022):  This is the current hospital active medication list Current Facility-Administered Medications  Medication Dose Route Frequency Provider Last Rate Last Admin   acetaminophen (TYLENOL) tablet 650 mg  650 mg Oral Q6H PRN Zierle-Ghosh, Asia B, DO       Or   acetaminophen (TYLENOL) suppository 650 mg  650 mg Rectal Q6H PRN Zierle-Ghosh, Asia B, DO       apixaban (ELIQUIS) tablet 10 mg  10 mg Oral BID Tat, Shanon Brow, MD   10 mg at 01/06/22 6387   Followed by   Derrill Memo ON 01/13/2022] apixaban (ELIQUIS) tablet 5 mg  5 mg Oral BID Tat, David, MD       calcium carbonate (TUMS - dosed in mg elemental calcium) chewable tablet 200 mg of elemental calcium  1 tablet Oral Daily Zierle-Ghosh, Asia B, DO   200 mg of elemental calcium at 56/43/32 9518   folic acid (FOLVITE) tablet 1 mg  1 mg Oral Daily Zierle-Ghosh, Asia B, DO   1 mg at 01/06/22 0938   lactulose (CHRONULAC) 10 GM/15ML solution 10 g  10 g Oral Daily Zierle-Ghosh, Asia B, DO  10 g at 01/06/22 0940   levothyroxine (SYNTHROID) tablet 50 mcg  50 mcg Oral QAC breakfast Zierle-Ghosh, Asia B, DO   50 mcg at 01/06/22 0553   magnesium sulfate IVPB 2 g 50 mL  2 g Intravenous Once Tat, Shanon Brow, MD 50 mL/hr at 01/06/22 1138 2 g at 01/06/22 1138   midodrine (PROAMATINE) tablet 10 mg  10 mg Oral TID WC Zierle-Ghosh, Asia B, DO   10 mg at 01/06/22 1140   ondansetron (ZOFRAN) tablet 4 mg  4 mg Oral Q6H PRN Zierle-Ghosh, Asia B, DO       Or   ondansetron (ZOFRAN) injection 4 mg  4 mg Intravenous Q6H PRN Zierle-Ghosh, Asia B, DO       oxyCODONE (Oxy IR/ROXICODONE) immediate release tablet 5 mg  5 mg Oral Q4H PRN Zierle-Ghosh, Asia B, DO   5 mg at 01/06/22 0556   pantoprazole (PROTONIX) EC  tablet 40 mg  40 mg Oral BID AC Zierle-Ghosh, Asia B, DO   40 mg at 01/06/22 0837   potassium chloride 10 mEq in 100 mL IVPB  10 mEq Intravenous Q1 Hr x 2 Tat, David, MD       potassium chloride SA (KLOR-CON M) CR tablet 20 mEq  20 mEq Oral Daily Tat, David, MD   20 mEq at 01/06/22 1610   thiamine tablet 250 mg  250 mg Oral Daily Zierle-Ghosh, Asia B, DO   250 mg at 01/06/22 0935   torsemide (DEMADEX) tablet 20 mg  20 mg Oral Daily Tat, David, MD   20 mg at 01/06/22 9604   vitamin B-12 (CYANOCOBALAMIN) tablet 250 mcg  250 mcg Oral Daily Zierle-Ghosh, Asia B, DO   250 mcg at 01/06/22 5409     Discharge Medications: Please see discharge summary for a list of discharge medications.  Relevant Imaging Results:  Relevant Lab Results:   Additional Information    Kavir Savoca, Clydene Pugh, LCSW

## 2022-01-06 NOTE — Progress Notes (Signed)
This nurse has attempted to call report x 2 to [facility] [phone]. No answer x 1 one. Transferred on the second call without an answer after 5 minutes on hold.

## 2022-01-10 ENCOUNTER — Other Ambulatory Visit: Payer: Self-pay | Admitting: Internal Medicine

## 2022-02-23 LAB — ACID FAST SMEAR (AFB, MYCOBACTERIA): Acid Fast Smear: NEGATIVE

## 2022-04-29 ENCOUNTER — Emergency Department (HOSPITAL_COMMUNITY): Payer: Managed Care, Other (non HMO)

## 2022-04-29 ENCOUNTER — Encounter (HOSPITAL_COMMUNITY): Payer: Self-pay

## 2022-04-29 ENCOUNTER — Inpatient Hospital Stay (HOSPITAL_COMMUNITY)
Admission: EM | Admit: 2022-04-29 | Discharge: 2022-05-09 | DRG: 433 | Disposition: A | Discharge status: Hospice | Payer: Managed Care, Other (non HMO) | Attending: Family Medicine | Admitting: Family Medicine

## 2022-04-29 ENCOUNTER — Other Ambulatory Visit: Payer: Self-pay

## 2022-04-29 DIAGNOSIS — I739 Peripheral vascular disease, unspecified: Secondary | ICD-10-CM | POA: Diagnosis present

## 2022-04-29 DIAGNOSIS — Z86718 Personal history of other venous thrombosis and embolism: Secondary | ICD-10-CM

## 2022-04-29 DIAGNOSIS — Z515 Encounter for palliative care: Secondary | ICD-10-CM

## 2022-04-29 DIAGNOSIS — E875 Hyperkalemia: Secondary | ICD-10-CM | POA: Diagnosis present

## 2022-04-29 DIAGNOSIS — K7460 Unspecified cirrhosis of liver: Secondary | ICD-10-CM | POA: Diagnosis not present

## 2022-04-29 DIAGNOSIS — E7220 Disorder of urea cycle metabolism, unspecified: Secondary | ICD-10-CM | POA: Diagnosis present

## 2022-04-29 DIAGNOSIS — R7881 Bacteremia: Secondary | ICD-10-CM | POA: Diagnosis present

## 2022-04-29 DIAGNOSIS — S81802A Unspecified open wound, left lower leg, initial encounter: Secondary | ICD-10-CM | POA: Diagnosis not present

## 2022-04-29 DIAGNOSIS — N179 Acute kidney failure, unspecified: Secondary | ICD-10-CM | POA: Diagnosis present

## 2022-04-29 DIAGNOSIS — Z8249 Family history of ischemic heart disease and other diseases of the circulatory system: Secondary | ICD-10-CM

## 2022-04-29 DIAGNOSIS — I872 Venous insufficiency (chronic) (peripheral): Secondary | ICD-10-CM | POA: Diagnosis present

## 2022-04-29 DIAGNOSIS — B961 Klebsiella pneumoniae [K. pneumoniae] as the cause of diseases classified elsewhere: Secondary | ICD-10-CM | POA: Diagnosis present

## 2022-04-29 DIAGNOSIS — L03115 Cellulitis of right lower limb: Secondary | ICD-10-CM | POA: Diagnosis present

## 2022-04-29 DIAGNOSIS — K766 Portal hypertension: Secondary | ICD-10-CM | POA: Diagnosis present

## 2022-04-29 DIAGNOSIS — F32A Depression, unspecified: Secondary | ICD-10-CM | POA: Diagnosis present

## 2022-04-29 DIAGNOSIS — Z66 Do not resuscitate: Secondary | ICD-10-CM | POA: Diagnosis not present

## 2022-04-29 DIAGNOSIS — R188 Other ascites: Secondary | ICD-10-CM | POA: Diagnosis present

## 2022-04-29 DIAGNOSIS — E039 Hypothyroidism, unspecified: Secondary | ICD-10-CM | POA: Diagnosis present

## 2022-04-29 DIAGNOSIS — L03116 Cellulitis of left lower limb: Secondary | ICD-10-CM | POA: Diagnosis present

## 2022-04-29 DIAGNOSIS — D696 Thrombocytopenia, unspecified: Secondary | ICD-10-CM | POA: Diagnosis present

## 2022-04-29 DIAGNOSIS — E872 Acidosis, unspecified: Secondary | ICD-10-CM | POA: Diagnosis present

## 2022-04-29 DIAGNOSIS — I9589 Other hypotension: Secondary | ICD-10-CM | POA: Diagnosis present

## 2022-04-29 DIAGNOSIS — D72829 Elevated white blood cell count, unspecified: Secondary | ICD-10-CM | POA: Diagnosis present

## 2022-04-29 DIAGNOSIS — E669 Obesity, unspecified: Secondary | ICD-10-CM | POA: Diagnosis present

## 2022-04-29 DIAGNOSIS — C189 Malignant neoplasm of colon, unspecified: Secondary | ICD-10-CM | POA: Diagnosis present

## 2022-04-29 DIAGNOSIS — A419 Sepsis, unspecified organism: Principal | ICD-10-CM

## 2022-04-29 DIAGNOSIS — E871 Hypo-osmolality and hyponatremia: Secondary | ICD-10-CM | POA: Diagnosis present

## 2022-04-29 DIAGNOSIS — Z803 Family history of malignant neoplasm of breast: Secondary | ICD-10-CM

## 2022-04-29 DIAGNOSIS — G894 Chronic pain syndrome: Secondary | ICD-10-CM | POA: Diagnosis present

## 2022-04-29 DIAGNOSIS — F419 Anxiety disorder, unspecified: Secondary | ICD-10-CM | POA: Diagnosis present

## 2022-04-29 DIAGNOSIS — A491 Streptococcal infection, unspecified site: Secondary | ICD-10-CM

## 2022-04-29 DIAGNOSIS — L89152 Pressure ulcer of sacral region, stage 2: Secondary | ICD-10-CM | POA: Diagnosis present

## 2022-04-29 DIAGNOSIS — Z811 Family history of alcohol abuse and dependence: Secondary | ICD-10-CM

## 2022-04-29 DIAGNOSIS — D638 Anemia in other chronic diseases classified elsewhere: Secondary | ICD-10-CM | POA: Diagnosis present

## 2022-04-29 DIAGNOSIS — Z79890 Hormone replacement therapy (postmenopausal): Secondary | ICD-10-CM

## 2022-04-29 DIAGNOSIS — D6959 Other secondary thrombocytopenia: Secondary | ICD-10-CM | POA: Diagnosis present

## 2022-04-29 DIAGNOSIS — Z6838 Body mass index (BMI) 38.0-38.9, adult: Secondary | ICD-10-CM

## 2022-04-29 DIAGNOSIS — K2100 Gastro-esophageal reflux disease with esophagitis, without bleeding: Secondary | ICD-10-CM | POA: Diagnosis present

## 2022-04-29 DIAGNOSIS — B955 Unspecified streptococcus as the cause of diseases classified elsewhere: Secondary | ICD-10-CM | POA: Diagnosis present

## 2022-04-29 DIAGNOSIS — Z85828 Personal history of other malignant neoplasm of skin: Secondary | ICD-10-CM

## 2022-04-29 DIAGNOSIS — Z7901 Long term (current) use of anticoagulants: Secondary | ICD-10-CM

## 2022-04-29 DIAGNOSIS — K219 Gastro-esophageal reflux disease without esophagitis: Secondary | ICD-10-CM | POA: Diagnosis present

## 2022-04-29 DIAGNOSIS — Z9981 Dependence on supplemental oxygen: Secondary | ICD-10-CM

## 2022-04-29 DIAGNOSIS — K7210 Chronic hepatic failure without coma: Secondary | ICD-10-CM | POA: Diagnosis present

## 2022-04-29 DIAGNOSIS — B954 Other streptococcus as the cause of diseases classified elsewhere: Secondary | ICD-10-CM | POA: Diagnosis not present

## 2022-04-29 DIAGNOSIS — I82409 Acute embolism and thrombosis of unspecified deep veins of unspecified lower extremity: Secondary | ICD-10-CM | POA: Diagnosis present

## 2022-04-29 DIAGNOSIS — Z800 Family history of malignant neoplasm of digestive organs: Secondary | ICD-10-CM

## 2022-04-29 DIAGNOSIS — K7682 Hepatic encephalopathy: Secondary | ICD-10-CM | POA: Diagnosis present

## 2022-04-29 DIAGNOSIS — J9611 Chronic respiratory failure with hypoxia: Secondary | ICD-10-CM | POA: Diagnosis present

## 2022-04-29 DIAGNOSIS — B1920 Unspecified viral hepatitis C without hepatic coma: Secondary | ICD-10-CM | POA: Diagnosis present

## 2022-04-29 DIAGNOSIS — Z20822 Contact with and (suspected) exposure to covid-19: Secondary | ICD-10-CM | POA: Diagnosis present

## 2022-04-29 DIAGNOSIS — R54 Age-related physical debility: Secondary | ICD-10-CM | POA: Diagnosis present

## 2022-04-29 DIAGNOSIS — Z1611 Resistance to penicillins: Secondary | ICD-10-CM | POA: Diagnosis present

## 2022-04-29 DIAGNOSIS — N390 Urinary tract infection, site not specified: Secondary | ICD-10-CM | POA: Diagnosis present

## 2022-04-29 LAB — CBC WITH DIFFERENTIAL/PLATELET
Abs Immature Granulocytes: 0.57 10*3/uL — ABNORMAL HIGH (ref 0.00–0.07)
Basophils Absolute: 0.1 10*3/uL (ref 0.0–0.1)
Basophils Relative: 0 %
Eosinophils Absolute: 0.1 10*3/uL (ref 0.0–0.5)
Eosinophils Relative: 0 %
HCT: 33.5 % — ABNORMAL LOW (ref 36.0–46.0)
Hemoglobin: 10.8 g/dL — ABNORMAL LOW (ref 12.0–15.0)
Immature Granulocytes: 3 %
Lymphocytes Relative: 5 %
Lymphs Abs: 1.1 10*3/uL (ref 0.7–4.0)
MCH: 31.6 pg (ref 26.0–34.0)
MCHC: 32.2 g/dL (ref 30.0–36.0)
MCV: 98 fL (ref 80.0–100.0)
Monocytes Absolute: 0.5 10*3/uL (ref 0.1–1.0)
Monocytes Relative: 2 %
Neutro Abs: 19.8 10*3/uL — ABNORMAL HIGH (ref 1.7–7.7)
Neutrophils Relative %: 90 %
Platelets: 184 10*3/uL (ref 150–400)
RBC: 3.42 MIL/uL — ABNORMAL LOW (ref 3.87–5.11)
RDW: 15.7 % — ABNORMAL HIGH (ref 11.5–15.5)
WBC: 22.1 10*3/uL — ABNORMAL HIGH (ref 4.0–10.5)
nRBC: 0 % (ref 0.0–0.2)

## 2022-04-29 LAB — URINALYSIS, ROUTINE W REFLEX MICROSCOPIC
Bilirubin Urine: NEGATIVE
Glucose, UA: NEGATIVE mg/dL
Hgb urine dipstick: NEGATIVE
Ketones, ur: NEGATIVE mg/dL
Leukocytes,Ua: NEGATIVE
Nitrite: NEGATIVE
Protein, ur: NEGATIVE mg/dL
Specific Gravity, Urine: 1.017 (ref 1.005–1.030)
pH: 5 (ref 5.0–8.0)

## 2022-04-29 LAB — COMPREHENSIVE METABOLIC PANEL
ALT: 29 U/L (ref 0–44)
AST: 42 U/L — ABNORMAL HIGH (ref 15–41)
Albumin: 1.8 g/dL — ABNORMAL LOW (ref 3.5–5.0)
Alkaline Phosphatase: 207 U/L — ABNORMAL HIGH (ref 38–126)
Anion gap: 8 (ref 5–15)
BUN: 37 mg/dL — ABNORMAL HIGH (ref 6–20)
CO2: 15 mmol/L — ABNORMAL LOW (ref 22–32)
Calcium: 7.8 mg/dL — ABNORMAL LOW (ref 8.9–10.3)
Chloride: 106 mmol/L (ref 98–111)
Creatinine, Ser: 1.09 mg/dL — ABNORMAL HIGH (ref 0.44–1.00)
GFR, Estimated: 59 mL/min — ABNORMAL LOW (ref >60)
Glucose, Bld: 98 mg/dL (ref 70–99)
Potassium: 5.4 mmol/L — ABNORMAL HIGH (ref 3.5–5.1)
Sodium: 129 mmol/L — ABNORMAL LOW (ref 135–145)
Total Bilirubin: 1.6 mg/dL — ABNORMAL HIGH (ref 0.3–1.2)
Total Protein: 5.3 g/dL — ABNORMAL LOW (ref 6.5–8.1)

## 2022-04-29 LAB — AMMONIA: Ammonia: 42 umol/L — ABNORMAL HIGH (ref 9–35)

## 2022-04-29 LAB — PROTIME-INR
INR: 2.1 — ABNORMAL HIGH (ref 0.8–1.2)
Prothrombin Time: 23.6 seconds — ABNORMAL HIGH (ref 11.4–15.2)

## 2022-04-29 LAB — LACTIC ACID, PLASMA
Lactic Acid, Venous: 2.6 mmol/L (ref 0.5–1.9)
Lactic Acid, Venous: 2.5 mmol/L (ref 0.5–1.9)

## 2022-04-29 LAB — RESP PANEL BY RT-PCR (FLU A&B, COVID) ARPGX2
Influenza A by PCR: NEGATIVE
Influenza B by PCR: NEGATIVE
SARS Coronavirus 2 by RT PCR: NEGATIVE

## 2022-04-29 LAB — APTT: aPTT: 36 seconds (ref 24–36)

## 2022-04-29 LAB — POC URINE PREG, ED: Preg Test, Ur: NEGATIVE

## 2022-04-29 LAB — MAGNESIUM: Magnesium: 2.3 mg/dL (ref 1.7–2.4)

## 2022-04-29 MED ORDER — VANCOMYCIN HCL IN DEXTROSE 1-5 GM/200ML-% IV SOLN
1000.0000 mg | Freq: Once | INTRAVENOUS | Status: AC
  Administered 2022-04-29: 1000 mg via INTRAVENOUS
  Filled 2022-04-29: qty 200

## 2022-04-29 MED ORDER — SODIUM CHLORIDE 0.9 % IV SOLN
2.0000 g | Freq: Once | INTRAVENOUS | Status: AC
  Administered 2022-04-29: 2 g via INTRAVENOUS
  Filled 2022-04-29: qty 20

## 2022-04-29 MED ORDER — LEVOFLOXACIN IN D5W 750 MG/150ML IV SOLN
750.0000 mg | Freq: Once | INTRAVENOUS | Status: DC
  Filled 2022-04-29: qty 150

## 2022-04-29 MED ORDER — SODIUM CHLORIDE 0.9 % IV BOLUS (SEPSIS)
1000.0000 mL | Freq: Once | INTRAVENOUS | Status: AC
  Administered 2022-04-29: 1000 mL via INTRAVENOUS

## 2022-04-29 MED ORDER — SODIUM CHLORIDE 0.9 % IV BOLUS
1000.0000 mL | Freq: Once | INTRAVENOUS | Status: AC
  Administered 2022-04-29: 1000 mL via INTRAVENOUS

## 2022-04-29 MED ORDER — SODIUM CHLORIDE 0.9 % IV SOLN: 1.0000 g | INTRAVENOUS | Status: DC

## 2022-04-29 MED ORDER — IOHEXOL 300 MG/ML  SOLN
100.0000 mL | Freq: Once | INTRAMUSCULAR | Status: AC | PRN
  Administered 2022-04-29: 100 mL via INTRAVENOUS

## 2022-04-29 MED ORDER — LACTATED RINGERS IV SOLN
INTRAVENOUS | Status: AC
  Administered 2022-04-29: 1000 mL via INTRAVENOUS

## 2022-04-29 MED ORDER — VANCOMYCIN HCL 1250 MG/250ML IV SOLN
1250.0000 mg | INTRAVENOUS | Status: DC
  Administered 2022-04-30 – 2022-05-01 (×2): 1250 mg via INTRAVENOUS
  Filled 2022-04-29 (×2): qty 250

## 2022-04-29 NOTE — Progress Notes (Signed)
Pharmacy Antibiotic Note ? ?Kristie Brooks is a 56 y.o. female admitted on 04/29/2022 with cellulitis.  Pharmacy has been consulted for Vancomycin dosing. WBC is elevated. Renal function ok.  ? ?Plan: ?Vancomycin 1250 mg IV q24h ?>>>Estimated AUC: 512 ?Ceftriaxone per MD ?Trend WBC, temp, renal function  ?F/U infectious work-up ?Drug levels as indicated ? ? ?Height: '5\' 5"'$  (165.1 cm) ?Weight: 104 kg (229 lb 4.5 oz) ?IBW/kg (Calculated) : 57 ? ?Temp (24hrs), Avg:98.3 ?F (36.8 ?C), Min:97.7 ?F (36.5 ?C), Max:98.8 ?F (37.1 ?C) ? ?Recent Labs  ?Lab 04/29/22 ?1529 04/29/22 ?1928  ?WBC 22.1*  --   ?CREATININE 1.09*  --   ?LATICACIDVEN 2.6* 2.5*  ?  ?Estimated Creatinine Clearance: 68.1 mL/min (A) (by C-G formula based on SCr of 1.09 mg/dL (H)).   ? ?Allergies  ?Allergen Reactions  ? Dakin's [Sodium Hypochlorite] Dermatitis  ? Zosyn [Piperacillin Sod-Tazobactam So] Other (See Comments)  ?  Blood count dropped  ? ? ?Narda Bonds, PharmD, BCPS ?Clinical Pharmacist ?Phone: [phone] ? ? ?

## 2022-04-29 NOTE — ED Triage Notes (Signed)
Pt presents to ED via [place] for generalized weakness and decreased appetite x 3-4 days. Pt states she has had diarrhea x 1 day.  ?

## 2022-04-29 NOTE — Progress Notes (Signed)
Elink following sepsis bundle. °

## 2022-04-29 NOTE — ED Provider Notes (Signed)
?Carl ?Provider Note ? ? ?CSN: [id number] ?Arrival date & time: 04/29/22  1518 ? ?  ? ?History ? ?Chief Complaint  ?Patient presents with  ? Weakness  ? ? ?Kristie Brooks is a 56 y.o. female. ? ? ?Weakness ?Associated symptoms: diarrhea   ?Associated symptoms: no abdominal pain, no chest pain, no cough, no dysuria, no fever, no frequency, no headaches, no nausea, no shortness of breath and no vomiting   ? ?This patient is a 56 year old female who unfortunately has a history of end-stage liver disease, she is chronically edematous and has suffered with wounds on her bilateral lower extremities which become infected not infrequently.  Review of the medical record shows that the patient is followed at [facility] for chronic ulcers of her legs, cirrhosis of the liver and was admitted to the hospital most recently in January 2023 for acute DVT.  She also has a history of sepsis and shock and was admitted in December 2022. ? ?She presents to the hospital today with a complaint of generalized weakness.  The family reports that she has had virtually nothing to eat in 3 days, she has had difficulty even getting out of bed and today was able to take 2 steps to a bedside commode which is the most activity that she has had in 3 days.  The patient has her mental status and the paramedics reported normal vital signs prehospital without tachycardia hypoxia or hypotension.  The patient denies having fevers but does endorse some recent diarrhea. ? ?Home Medications ?Prior to Admission medications   ?Medication Sig Start Date End Date Taking? Authorizing Provider  ?acetaminophen (TYLENOL) 325 MG tablet Take 650 mg by mouth every 6 (six) hours as needed for moderate pain.   Yes [provider]  ?apixaban (ELIQUIS) 5 MG TABS tablet Take 2 tablets (10 mg total) by mouth 2 (two) times daily. Then 1 tab (5 mg) two times daily starting 01/13/22 01/06/22  Yes Tat, Shanon Brow, MD  ?Ascorbic Acid (VITAMIN C) 1000 MG  tablet Take 1,000 mg by mouth daily.   Yes [provider]  ?calcium carbonate (TUMS - DOSED IN MG ELEMENTAL CALCIUM) 500 MG chewable tablet Chew 1 tablet by mouth daily.   Yes [provider]  ?fexofenadine (ALLEGRA) 180 MG tablet Take by mouth.   Yes [provider]  ?fish oil-omega-3 fatty acids 1000 MG capsule Take 1 g by mouth daily.   Yes [provider]  ?folic acid (FOLVITE) 1 MG tablet Take 1 mg by mouth daily.   Yes [provider]  ?levothyroxine (SYNTHROID) 50 MCG tablet Take 1 tablet (50 mcg total) by mouth daily before breakfast. 12/24/21  Yes Tat, David, MD  ?magnesium oxide (MAG-OX) 400 MG tablet Take 400 mg by mouth daily.   Yes [provider]  ?midodrine (PROAMATINE) 10 MG tablet Take 1 tablet (10 mg total) by mouth 3 (three) times daily with meals. 12/24/21  Yes Tat, Shanon Brow, MD  ?ondansetron (ZOFRAN) 4 MG tablet Take 4 mg by mouth every 8 (eight) hours as needed for nausea. 04/14/22  Yes [provider]  ?oxyCODONE (OXY IR/ROXICODONE) 5 MG immediate release tablet Take 1 tablet (5 mg total) by mouth every 4 (four) hours as needed for moderate pain. 01/06/22  Yes Tat, Shanon Brow, MD  ?pantoprazole (PROTONIX) 40 MG tablet Take 1 tablet (40 mg total) by mouth 2 (two) times daily before a meal. 12/24/21  Yes Tat, Shanon Brow, MD  ?potassium chloride SA (KLOR-CON M)  20 MEQ tablet Take 1 tablet (20 mEq total) by mouth daily. 01/07/22  Yes Tat, Shanon Brow, MD  ?thiamine 250 MG tablet Take 250 mg by mouth daily.   Yes [provider]  ?torsemide (DEMADEX) 20 MG tablet Take 20 mg by mouth daily.   Yes [provider]  ?vitamin B-12 (CYANOCOBALAMIN) 250 MCG tablet Take 250 mcg by mouth daily.   Yes [provider]  ?Vitamin D, Ergocalciferol, (DRISDOL) 1.25 MG (50000 UNIT) CAPS capsule Take 50,000 Units by mouth every 7 (seven) days. Sundays   Yes [provider]  ?vitamin E 200 UNIT capsule Take 400 Units by mouth daily.   Yes  [provider]  ?lactulose (CHRONULAC) 10 GM/15ML solution Take 15 mLs (10 g total) by mouth daily. ?Patient not taking: Reported on 04/29/2022 01/06/22   Orson Eva, MD  ?   ? ?Allergies    ?Dakin's [sodium hypochlorite] and Zosyn [piperacillin sod-tazobactam so]   ? ?Review of Systems   ?Review of Systems  ?Constitutional:  Negative for chills and fever.  ?HENT:  Negative for sore throat.   ?Eyes:  Negative for visual disturbance.  ?Respiratory:  Negative for cough and shortness of breath.   ?Cardiovascular:  Negative for chest pain.  ?Gastrointestinal:  Positive for diarrhea. Negative for abdominal pain, nausea and vomiting.  ?Genitourinary:  Negative for dysuria and frequency.  ?Musculoskeletal:  Negative for back pain and neck pain.  ?Skin:  Negative for rash.  ?Neurological:  Positive for weakness. Negative for numbness and headaches.  ?Hematological:  Negative for adenopathy.  ?Psychiatric/Behavioral:  Negative for behavioral problems.   ? ?Physical Exam ?Updated Vital Signs ?BP (!) 94/58   Pulse 73   Temp 98.8 ?F (37.1 ?C) (Rectal)   Resp 17   Ht 1.651 m ('5\' 5"'$ )   Wt 104 kg   SpO2 97%   BMI 38.15 kg/m?  ?Physical Exam ?Vitals and nursing note reviewed.  ?Constitutional:   ?   Appearance: She is well-developed. She is ill-appearing.  ?HENT:  ?   Head: Normocephalic and atraumatic.  ?   Mouth/Throat:  ?   Mouth: Mucous membranes are dry.  ?   Pharynx: No oropharyngeal exudate.  ?Eyes:  ?   General: No scleral icterus.    ?   Right eye: No discharge.     ?   Left eye: No discharge.  ?   Conjunctiva/sclera: Conjunctivae normal.  ?   Pupils: Pupils are equal, round, and reactive to light.  ?Neck:  ?   Thyroid: No thyromegaly.  ?   Vascular: No JVD.  ?Cardiovascular:  ?   Rate and Rhythm: Normal rate and regular rhythm.  ?   Heart sounds: Normal heart sounds. No murmur heard. ?  No friction rub. No gallop.  ?Pulmonary:  ?   Effort: Pulmonary effort is normal. No respiratory distress.  ?   Breath  sounds: Normal breath sounds. No wheezing or rales.  ?Abdominal:  ?   General: Bowel sounds are normal. There is no distension.  ?   Palpations: Abdomen is soft. There is no mass.  ?   Tenderness: There is abdominal tenderness.  ?   Comments: Soft abdomen but mild diffuse tenderness  ?Musculoskeletal:     ?   General: No tenderness. Normal range of motion.  ?   Cervical back: Normal range of motion and neck supple.  ?   Right lower leg: Edema present.  ?   Left lower leg: Edema present.  ?  Comments: Edema spreading up the thighs towards the hips bilaterally, symmetric  ?Lymphadenopathy:  ?   Cervical: No cervical adenopathy.  ?Skin: ?   General: Skin is warm and dry.  ?   Coloration: Pallor: .milm.  ?   Findings: No erythema or rash.  ?   Comments: Large skin defect in the left anterior lower extremity, it appears to have clean margins and good granulation tissue but does have a foul smell to it.  There does appear to be petechiae scattered across the lower extremities and somewhat on the trunk as well  ?Neurological:  ?   Mental Status: She is alert.  ?   Coordination: Coordination normal.  ?   Comments: Severe generalized weakness, she is able to follow commands and move all 4 extremities but very very generally weak.  Cranial nerves III through XII appear normal  ?Psychiatric:     ?   Behavior: Behavior normal.  ? ? ?ED Results / Procedures / Treatments   ?Labs ?(all labs ordered are listed, but only abnormal results are displayed) ?Labs Reviewed  ?LACTIC ACID, PLASMA - Abnormal; Notable for the following components:  ?    Result Value  ? Lactic Acid, Venous 2.6 (*)   ? All other components within normal limits  ?LACTIC ACID, PLASMA - Abnormal; Notable for the following components:  ? Lactic Acid, Venous 2.5 (*)   ? All other components within normal limits  ?COMPREHENSIVE METABOLIC PANEL - Abnormal; Notable for the following components:  ? Sodium 129 (*)   ? Potassium 5.4 (*)   ? CO2 15 (*)   ? BUN 37 (*)   ?  Creatinine, Ser 1.09 (*)   ? Calcium 7.8 (*)   ? Total Protein 5.3 (*)   ? Albumin 1.8 (*)   ? AST 42 (*)   ? Alkaline Phosphatase 207 (*)   ? Total Bilirubin 1.6 (*)   ? GFR, Estimated 59 (*)   ? All other comp

## 2022-04-30 DIAGNOSIS — D696 Thrombocytopenia, unspecified: Secondary | ICD-10-CM | POA: Diagnosis present

## 2022-04-30 DIAGNOSIS — D72829 Elevated white blood cell count, unspecified: Secondary | ICD-10-CM | POA: Diagnosis not present

## 2022-04-30 DIAGNOSIS — R7881 Bacteremia: Secondary | ICD-10-CM | POA: Diagnosis present

## 2022-04-30 DIAGNOSIS — K2100 Gastro-esophageal reflux disease with esophagitis, without bleeding: Secondary | ICD-10-CM | POA: Diagnosis not present

## 2022-04-30 DIAGNOSIS — I739 Peripheral vascular disease, unspecified: Secondary | ICD-10-CM | POA: Diagnosis present

## 2022-04-30 DIAGNOSIS — Z515 Encounter for palliative care: Secondary | ICD-10-CM | POA: Diagnosis present

## 2022-04-30 DIAGNOSIS — L03116 Cellulitis of left lower limb: Secondary | ICD-10-CM | POA: Diagnosis present

## 2022-04-30 DIAGNOSIS — I9589 Other hypotension: Secondary | ICD-10-CM | POA: Diagnosis present

## 2022-04-30 DIAGNOSIS — E875 Hyperkalemia: Secondary | ICD-10-CM | POA: Diagnosis not present

## 2022-04-30 DIAGNOSIS — J9611 Chronic respiratory failure with hypoxia: Secondary | ICD-10-CM | POA: Diagnosis present

## 2022-04-30 DIAGNOSIS — Z66 Do not resuscitate: Secondary | ICD-10-CM | POA: Diagnosis present

## 2022-04-30 DIAGNOSIS — E871 Hypo-osmolality and hyponatremia: Secondary | ICD-10-CM | POA: Diagnosis present

## 2022-04-30 DIAGNOSIS — A419 Sepsis, unspecified organism: Secondary | ICD-10-CM | POA: Diagnosis not present

## 2022-04-30 DIAGNOSIS — A491 Streptococcal infection, unspecified site: Secondary | ICD-10-CM | POA: Diagnosis not present

## 2022-04-30 DIAGNOSIS — Z7189 Other specified counseling: Secondary | ICD-10-CM | POA: Diagnosis not present

## 2022-04-30 DIAGNOSIS — D638 Anemia in other chronic diseases classified elsewhere: Secondary | ICD-10-CM | POA: Diagnosis present

## 2022-04-30 DIAGNOSIS — E872 Acidosis, unspecified: Secondary | ICD-10-CM | POA: Diagnosis present

## 2022-04-30 DIAGNOSIS — L89152 Pressure ulcer of sacral region, stage 2: Secondary | ICD-10-CM | POA: Diagnosis present

## 2022-04-30 DIAGNOSIS — N179 Acute kidney failure, unspecified: Secondary | ICD-10-CM | POA: Diagnosis present

## 2022-04-30 DIAGNOSIS — G894 Chronic pain syndrome: Secondary | ICD-10-CM | POA: Diagnosis not present

## 2022-04-30 DIAGNOSIS — C189 Malignant neoplasm of colon, unspecified: Secondary | ICD-10-CM | POA: Diagnosis present

## 2022-04-30 DIAGNOSIS — J9610 Chronic respiratory failure, unspecified whether with hypoxia or hypercapnia: Secondary | ICD-10-CM | POA: Diagnosis present

## 2022-04-30 DIAGNOSIS — E0590 Thyrotoxicosis, unspecified without thyrotoxic crisis or storm: Secondary | ICD-10-CM | POA: Diagnosis present

## 2022-04-30 DIAGNOSIS — I8510 Secondary esophageal varices without bleeding: Secondary | ICD-10-CM | POA: Diagnosis present

## 2022-04-30 DIAGNOSIS — K7682 Hepatic encephalopathy: Secondary | ICD-10-CM | POA: Diagnosis present

## 2022-04-30 DIAGNOSIS — Z20822 Contact with and (suspected) exposure to covid-19: Secondary | ICD-10-CM | POA: Diagnosis present

## 2022-04-30 DIAGNOSIS — K7460 Unspecified cirrhosis of liver: Secondary | ICD-10-CM | POA: Diagnosis present

## 2022-04-30 DIAGNOSIS — S81802A Unspecified open wound, left lower leg, initial encounter: Secondary | ICD-10-CM | POA: Diagnosis present

## 2022-04-30 DIAGNOSIS — K766 Portal hypertension: Secondary | ICD-10-CM | POA: Diagnosis present

## 2022-04-30 DIAGNOSIS — N390 Urinary tract infection, site not specified: Secondary | ICD-10-CM | POA: Diagnosis present

## 2022-04-30 DIAGNOSIS — R188 Other ascites: Secondary | ICD-10-CM

## 2022-04-30 DIAGNOSIS — K219 Gastro-esophageal reflux disease without esophagitis: Secondary | ICD-10-CM

## 2022-04-30 DIAGNOSIS — B955 Unspecified streptococcus as the cause of diseases classified elsewhere: Secondary | ICD-10-CM | POA: Diagnosis not present

## 2022-04-30 DIAGNOSIS — D6959 Other secondary thrombocytopenia: Secondary | ICD-10-CM | POA: Diagnosis present

## 2022-04-30 DIAGNOSIS — F32A Depression, unspecified: Secondary | ICD-10-CM | POA: Diagnosis present

## 2022-04-30 DIAGNOSIS — L89159 Pressure ulcer of sacral region, unspecified stage: Secondary | ICD-10-CM | POA: Diagnosis present

## 2022-04-30 DIAGNOSIS — E669 Obesity, unspecified: Secondary | ICD-10-CM

## 2022-04-30 DIAGNOSIS — K7210 Chronic hepatic failure without coma: Secondary | ICD-10-CM | POA: Diagnosis present

## 2022-04-30 DIAGNOSIS — R6520 Severe sepsis without septic shock: Secondary | ICD-10-CM | POA: Diagnosis not present

## 2022-04-30 DIAGNOSIS — E7220 Disorder of urea cycle metabolism, unspecified: Secondary | ICD-10-CM | POA: Diagnosis not present

## 2022-04-30 DIAGNOSIS — E039 Hypothyroidism, unspecified: Secondary | ICD-10-CM

## 2022-04-30 LAB — BASIC METABOLIC PANEL
Anion gap: 8 (ref 5–15)
BUN: 34 mg/dL — ABNORMAL HIGH (ref 6–20)
CO2: 15 mmol/L — ABNORMAL LOW (ref 22–32)
Calcium: 7.6 mg/dL — ABNORMAL LOW (ref 8.9–10.3)
Chloride: 105 mmol/L (ref 98–111)
Creatinine, Ser: 1.04 mg/dL — ABNORMAL HIGH (ref 0.44–1.00)
GFR, Estimated: >60 mL/min (ref >60)
Glucose, Bld: 94 mg/dL (ref 70–99)
Potassium: 4.8 mmol/L (ref 3.5–5.1)
Sodium: 128 mmol/L — ABNORMAL LOW (ref 135–145)

## 2022-04-30 LAB — BLOOD CULTURE ID PANEL (REFLEXED) - BCID2

## 2022-04-30 LAB — LACTIC ACID, PLASMA
Lactic Acid, Venous: 2 mmol/L (ref 0.5–1.9)
Lactic Acid, Venous: 1.7 mmol/L (ref 0.5–1.9)

## 2022-04-30 LAB — CBC
HCT: 27 % — ABNORMAL LOW (ref 36.0–46.0)
Hemoglobin: 8.9 g/dL — ABNORMAL LOW (ref 12.0–15.0)
MCH: 31.7 pg (ref 26.0–34.0)
MCHC: 33 g/dL (ref 30.0–36.0)
MCV: 96.1 fL (ref 80.0–100.0)
Platelets: 125 10*3/uL — ABNORMAL LOW (ref 150–400)
RBC: 2.81 MIL/uL — ABNORMAL LOW (ref 3.87–5.11)
RDW: 15.6 % — ABNORMAL HIGH (ref 11.5–15.5)
WBC: 19.6 10*3/uL — ABNORMAL HIGH (ref 4.0–10.5)
nRBC: 0 % (ref 0.0–0.2)

## 2022-04-30 LAB — COMPREHENSIVE METABOLIC PANEL
ALT: 23 U/L (ref 0–44)
AST: 31 U/L (ref 15–41)
Albumin: <1.5 g/dL — ABNORMAL LOW (ref 3.5–5.0)
Alkaline Phosphatase: 170 U/L — ABNORMAL HIGH (ref 38–126)
Anion gap: 5 (ref 5–15)
BUN: 33 mg/dL — ABNORMAL HIGH (ref 6–20)
CO2: 15 mmol/L — ABNORMAL LOW (ref 22–32)
Calcium: 7.1 mg/dL — ABNORMAL LOW (ref 8.9–10.3)
Chloride: 106 mmol/L (ref 98–111)
Creatinine, Ser: 0.93 mg/dL (ref 0.44–1.00)
GFR, Estimated: >60 mL/min (ref >60)
Glucose, Bld: 89 mg/dL (ref 70–99)
Potassium: 4.7 mmol/L (ref 3.5–5.1)
Sodium: 126 mmol/L — ABNORMAL LOW (ref 135–145)
Total Bilirubin: 1.4 mg/dL — ABNORMAL HIGH (ref 0.3–1.2)
Total Protein: 4 g/dL — ABNORMAL LOW (ref 6.5–8.1)

## 2022-04-30 LAB — OSMOLALITY: Osmolality: 272 mOsm/kg — ABNORMAL LOW (ref 275–295)

## 2022-04-30 LAB — PHOSPHORUS: Phosphorus: 3.8 mg/dL (ref 2.5–4.6)

## 2022-04-30 LAB — MAGNESIUM: Magnesium: 2.1 mg/dL (ref 1.7–2.4)

## 2022-04-30 MED ORDER — VITAMIN B-12 100 MCG PO TABS
250.0000 ug | ORAL_TABLET | Freq: Every day | ORAL | Status: DC
  Administered 2022-04-30 – 2022-05-06 (×7): 250 ug via ORAL
  Filled 2022-04-30 (×7): qty 3

## 2022-04-30 MED ORDER — ACETAMINOPHEN 650 MG RE SUPP: 650.0000 mg | Freq: Four times a day (QID) | RECTAL | Status: DC | PRN

## 2022-04-30 MED ORDER — SODIUM CHLORIDE 0.9 % IV SOLN
2.0000 g | INTRAVENOUS | Status: DC
  Administered 2022-04-30 – 2022-05-03 (×4): 2 g via INTRAVENOUS
  Filled 2022-04-30 (×4): qty 20

## 2022-04-30 MED ORDER — ENOXAPARIN SODIUM 40 MG/0.4ML IJ SOSY: 40.0000 mg | PREFILLED_SYRINGE | INTRAMUSCULAR | Status: DC

## 2022-04-30 MED ORDER — ACETAMINOPHEN 325 MG PO TABS: 650.0000 mg | ORAL_TABLET | Freq: Four times a day (QID) | ORAL | Status: DC | PRN

## 2022-04-30 MED ORDER — APIXABAN 5 MG PO TABS
5.0000 mg | ORAL_TABLET | Freq: Two times a day (BID) | ORAL | Status: DC
  Administered 2022-04-30 – 2022-05-06 (×14): 5 mg via ORAL
  Filled 2022-04-30 (×14): qty 1

## 2022-04-30 MED ORDER — DULOXETINE HCL 30 MG PO CPEP
30.0000 mg | ORAL_CAPSULE | Freq: Every day | ORAL | Status: DC
  Administered 2022-04-30 – 2022-05-07 (×8): 30 mg via ORAL
  Filled 2022-04-30 (×8): qty 1

## 2022-04-30 MED ORDER — SODIUM CHLORIDE 0.9 % IV SOLN: INTRAVENOUS | Status: DC

## 2022-04-30 MED ORDER — LEVOTHYROXINE SODIUM 50 MCG PO TABS: 50.0000 ug | ORAL_TABLET | Freq: Every day | ORAL | Status: DC

## 2022-04-30 MED ORDER — PANTOPRAZOLE SODIUM 40 MG PO TBEC
40.0000 mg | DELAYED_RELEASE_TABLET | Freq: Two times a day (BID) | ORAL | Status: DC
  Administered 2022-04-30 – 2022-05-06 (×13): 40 mg via ORAL
  Filled 2022-04-30 (×13): qty 1

## 2022-04-30 MED ORDER — MIDODRINE HCL 5 MG PO TABS
10.0000 mg | ORAL_TABLET | Freq: Three times a day (TID) | ORAL | Status: DC
  Administered 2022-04-30 – 2022-05-07 (×22): 10 mg via ORAL
  Filled 2022-04-30 (×22): qty 2

## 2022-04-30 MED ORDER — OXYCODONE HCL 5 MG PO TABS
5.0000 mg | ORAL_TABLET | Freq: Three times a day (TID) | ORAL | Status: DC | PRN
  Administered 2022-04-30 – 2022-05-05 (×6): 5 mg via ORAL
  Filled 2022-04-30 (×7): qty 1

## 2022-04-30 MED ORDER — ENSURE ENLIVE PO LIQD
237.0000 mL | Freq: Two times a day (BID) | ORAL | Status: DC
  Filled 2022-04-30 (×4): qty 237

## 2022-04-30 MED ORDER — SODIUM ZIRCONIUM CYCLOSILICATE 5 G PO PACK
10.0000 g | PACK | Freq: Once | ORAL | Status: AC
  Administered 2022-04-30: 10 g via ORAL
  Filled 2022-04-30: qty 2

## 2022-04-30 MED ORDER — LEVOTHYROXINE SODIUM 50 MCG PO TABS
50.0000 ug | ORAL_TABLET | Freq: Every day | ORAL | Status: DC
  Administered 2022-05-04 – 2022-05-07 (×4): 50 ug via ORAL
  Filled 2022-04-30 (×8): qty 1

## 2022-04-30 MED ORDER — GERHARDT'S BUTT CREAM
TOPICAL_CREAM | Freq: Three times a day (TID) | CUTANEOUS | Status: DC
Start: 2022-04-30 — End: 2022-05-09
  Filled 2022-04-30: qty 1

## 2022-04-30 MED ORDER — NYSTATIN 100000 UNIT/GM EX POWD
Freq: Two times a day (BID) | CUTANEOUS | Status: DC
Start: 2022-04-30 — End: 2022-05-09
  Filled 2022-04-30 (×3): qty 15

## 2022-04-30 MED ORDER — FOLIC ACID 1 MG PO TABS
1.0000 mg | ORAL_TABLET | Freq: Every day | ORAL | Status: DC
  Administered 2022-04-30 – 2022-05-06 (×7): 1 mg via ORAL
  Filled 2022-04-30 (×8): qty 1

## 2022-04-30 NOTE — Progress Notes (Signed)
Patient seen and evaluated, chart reviewed, please see EMR for updated orders. Please see full H&P dictated by admitting physician Dr Josephine Cables for same date of service.  ? ?Brief Summary:- ?56 y.o. female with medical history significant of chronic respiratory failure on 2 LPM of oxygen, hypothyroidism, liver cirrhosis, bilateral lower extremity venous stasis dermatitis, right upper extremity DVT on Eliquis admitted on 04/30/2022 with electrolyte abnormalities, transaminitis and concerns about cellulitis/infected wounds in the setting of liver cirrhosis with large volume ascites ? ? 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 bili is down to 1.4 from 1.6, alk phos is down to 170 from 207 ? ?2) decubitus ulcers/pressure injury----POA ?-Rule out superimposed cellulitis ?Vancomycin and Rocephin as ordered ? ?3) right upper extremity DVT--continue local ? ?4) chronic hypoxic respiratory failure--- continue-nasal cannula 2 L/min ? ?5) chronic hypotension--- continue midodrine for pressure support ? ?6) chronic pain syndrome--- continue oxycodone, add Cymbalta ? ?7) hypothyroidism--- continue levothyroxine 50 mcg daily ? ?8)GERD--- continue Protonix ? ?9) hyponatremia----chronic in the setting of underlying liver cirrhosis ?-Avoid excessive free water ? ?10) leukocytosis--- suspect this is related to #1 #2 above ?-WBC is down to 19.6 from 22.1 ? ?11) chronic anemia and chronic thrombocytopenia--- related to underlying liver disease/cirrhosis ?-No obvious bleeding at this time ?Monitor closely as patient is on Eliquis for prior DVT ? ?-Plan of care discussed with patient's sister-in-law Debbie at bedside, questions answered ? ? ?-Total care time is 44 minutes ? ?Roxan Hockey, MD ? ?

## 2022-04-30 NOTE — Consult Note (Signed)
WOC Nurse Consult Note: ?Reason for Consult:Chronic, nonhealing full thickness wound to left lateral LE, small chronic nonhealing wound to RLE, bilateral buttocks with chronic friction injury. Patient followed by outpatient wound care center at [facility], [place]. The treatment POC for alginate dressings, ABD and Kerlix to LLE will be continued here.   Photos taken today by Dr. Denton Brick are appreciated. ?Wound type: full thickness, partial thickness, chronic friction skin injury to buttocks superimposed with partial thickness skin loss (Stage 2) ?Pressure Injury POA: Yes ?Measurement:Bedside RN to measure LLE wound with next dressing change.  Buttocks measure 17cm x 10cm x 0.2cm (on Nursing Flow Sheet) ?Wound bed: See photos, red, moist ?Drainage (amount, consistency, odor) Moderate serous to LLE ?Periwound: with hemosiderin staining ?Dressing procedure/placement/frequency: I have provided a mattress replacement with low air loss feature to mitigate friction, shear and microclimate. Topical care had already been initiated for LLE with an alginate, I provided Nursing with guidance for obtaining that dressing. Gerhart's Butt Cream is to be applied to the buttocks, this 1:1:1 antifungal:hydrocortisone:zinc oxide preparation will assist with resolution of the buttock injuries.  Turning and repositioning and the floatation of heels for PI prevention. ? ?[place] nursing team will not follow, but will remain available to this patient, the nursing and medical teams.  Please re-consult if needed. ?Thanks, ?Maudie Flakes, MSN, RN, [place], Benjamin Perez, CWON-[facility], [place]  ?Pager# [phone]  ? ? ? ?  ?

## 2022-04-30 NOTE — H&P (Signed)
?History and Physical  ? ? ?Patient: Kristie Brooks GXQ:[id number] DOB: 1966-06-02 ?DOA: 04/29/2022 ?DOS: the patient was seen and examined on 04/30/2022 ?PCP: Keane Police, MD  ?Patient coming from: Home ? ?Chief Complaint:  ?Chief Complaint  ?Patient presents with  ? Weakness  ? ?HPI: Kristie Brooks is a 56 y.o. female with medical history significant of chronic respiratory failure on 2 LPM of oxygen, hypothyroidism, liver cirrhosis, bilateral lower extremity venous stasis dermatitis, right upper extremity DVT on Eliquis who presents to the emergency department due to generalized weakness.  Patient complained of being so weak she could barely get out of bed and that she has almost sitting nothing in the last 3 days.  She complained of lower extremity wound and pressure ulcer in the mid sacral area. ?She was admitted from 1/10 to 1/12 due to right upper extremity DVT which was initially treated with IV heparin prior to transitioning to Eliquis on discharge. ?She was also admitted from 12/15/2021 to 12/26/2021 due to severe sepsis secondary to cellulitis which was treated with IV vancomycin and was later transitioned to Bactrim DS on discharge. ? ?ED Course:  ?In the emergency department, patient was intermittently tachypneic, BP was 106/65 and other vital signs were within normal range.  Work-up in the ED showed leukocytosis, normocytic anemia, hyponatremia, hypokalemia, bicarb 15, BUN/creatinine 37/1.09 (creatinine within baseline range).  Lactic acid 2.6 > 2.5, ammonia 42, ALP 207, AST 42, ALT 29, ammonia 42.  Influenza A, B, SARS coronavirus 2 was negative. ?CT abdomen and pelvis with contrast showed liver cirrhosis with signs for portal hypertension including ?splenomegaly, varices and large volume of ascites. ?Chest x-ray showed low lung volumes without evidence of acute cardiopulmonary disease ?She was treated with IV ceftriaxone and vancomycin, IV hydration was provided.  Hospitalist was asked to  admit patient for further evaluation and management. ? ?Review of Systems: ?Review of systems as noted in the HPI. All other systems reviewed and are negative. ? ? ?Past Medical History:  ?Diagnosis Date  ? Arthritis   ? Basal cell carcinoma 05/18/2021  ? nod-mid forehead ([facility])  ? GERD (gastroesophageal reflux disease)   ? Hyperthyroidism   ? Peripheral vascular disease ([employer])   ? Pneumonia   ? ?Past Surgical History:  ?Procedure Laterality Date  ? ESOPHAGOGASTRODUODENOSCOPY (EGD) WITH PROPOFOL N/A 12/24/2021  ? Procedure: ESOPHAGOGASTRODUODENOSCOPY (EGD) WITH PROPOFOL;  Surgeon: Rogene Houston, MD;  Location: [facility] END[facility] SUITE;  Service: Endoscopy;  Laterality: N/A;  ? TONSILLECTOMY    ? ? ?Social History:  reports that she has never smoked. She has never used smokeless tobacco. She reports that she does not drink alcohol and does not use drugs. ? ? ?Allergies  ?Allergen Reactions  ? Dakin's [Sodium Hypochlorite] Dermatitis  ? Zosyn [Piperacillin Sod-Tazobactam So] Other (See Comments)  ?  Blood count dropped  ? ? ?Family History  ?Problem Relation Age of Onset  ? Breast cancer Mother   ? Hypertension Mother   ? Gastric cancer Father   ? Hypertension Brother   ? Alcohol abuse Brother   ? Pancreatitis Brother   ?  ? ?Prior to Admission medications   ?Medication Sig Start Date End Date Taking? Authorizing Provider  ?acetaminophen (TYLENOL) 325 MG tablet Take 650 mg by mouth every 6 (six) hours as needed for moderate pain.   Yes [provider]  ?apixaban (ELIQUIS) 5 MG TABS tablet Take 2 tablets (10 mg total) by mouth 2 (two) times daily. Then 1 tab (5 mg)  two times daily starting 01/13/22 01/06/22  Yes Tat, Shanon Brow, MD  ?Ascorbic Acid (VITAMIN C) 1000 MG tablet Take 1,000 mg by mouth daily.   Yes [provider]  ?calcium carbonate (TUMS - DOSED IN MG ELEMENTAL CALCIUM) 500 MG chewable tablet Chew 1 tablet by mouth daily.   Yes [provider]  ?fexofenadine (ALLEGRA) 180 MG tablet Take by  mouth.   Yes [provider]  ?fish oil-omega-3 fatty acids 1000 MG capsule Take 1 g by mouth daily.   Yes [provider]  ?folic acid (FOLVITE) 1 MG tablet Take 1 mg by mouth daily.   Yes [provider]  ?levothyroxine (SYNTHROID) 50 MCG tablet Take 1 tablet (50 mcg total) by mouth daily before breakfast. 12/24/21  Yes Tat, David, MD  ?magnesium oxide (MAG-OX) 400 MG tablet Take 400 mg by mouth daily.   Yes [provider]  ?midodrine (PROAMATINE) 10 MG tablet Take 1 tablet (10 mg total) by mouth 3 (three) times daily with meals. 12/24/21  Yes Tat, Shanon Brow, MD  ?ondansetron (ZOFRAN) 4 MG tablet Take 4 mg by mouth every 8 (eight) hours as needed for nausea. 04/14/22  Yes [provider]  ?oxyCODONE (OXY IR/ROXICODONE) 5 MG immediate release tablet Take 1 tablet (5 mg total) by mouth every 4 (four) hours as needed for moderate pain. 01/06/22  Yes Tat, Shanon Brow, MD  ?pantoprazole (PROTONIX) 40 MG tablet Take 1 tablet (40 mg total) by mouth 2 (two) times daily before a meal. 12/24/21  Yes Tat, David, MD  ?potassium chloride SA (KLOR-CON M) 20 MEQ tablet Take 1 tablet (20 mEq total) by mouth daily. 01/07/22  Yes Tat, Shanon Brow, MD  ?thiamine 250 MG tablet Take 250 mg by mouth daily.   Yes [provider]  ?torsemide (DEMADEX) 20 MG tablet Take 20 mg by mouth daily.   Yes [provider]  ?vitamin B-12 (CYANOCOBALAMIN) 250 MCG tablet Take 250 mcg by mouth daily.   Yes [provider]  ?Vitamin D, Ergocalciferol, (DRISDOL) 1.25 MG (50000 UNIT) CAPS capsule Take 50,000 Units by mouth every 7 (seven) days. Sundays   Yes [provider]  ?vitamin E 200 UNIT capsule Take 400 Units by mouth daily.   Yes [provider]  ?lactulose (CHRONULAC) 10 GM/15ML solution Take 15 mLs (10 g total) by mouth daily. ?Patient not taking: Reported on 04/29/2022 01/06/22   Orson Eva, MD  ? ? ?Physical Exam: ?BP 96/60   Pulse 69   Temp 98.8 ?F (37.1 ?C) (Rectal)    Resp 14   Ht '5\' 5"'$  (1.651 m)   Wt 104 kg   SpO2 92%   BMI 38.15 kg/m?  ? ?General: 56 y.o. year-old female ill appearing but in no acute distress.  Alert and oriented x3. ?HEENT: NCAT, EOMI ?Neck: Supple, trachea medial ?Cardiovascular: Regular rate and rhythm with no rubs or gallops.  No thyromegaly or JVD noted.  2/4 pulses in all 4 extremities. ?Respiratory: Clear to auscultation with no wheezes or rales. Good inspiratory effort. ?Abdomen: Soft, nontender nondistended with normal bowel sounds x4 quadrants. ?Muskuloskeletal: Bilateral venous stasis with left open wound on left leg.  Sacral decubitus ulcer with dressing noted.   ?Neuro: CN II-XII intact, sensation, reflexes intact ?Skin: No ulcerative lesions noted or rashes ?Psychiatry: Judgement and insight appear normal. Mood is appropriate for condition and setting ?   ?   ?   ?Labs on Admission:  ?Basic Metabolic Panel: ?Recent Labs  ?Lab 04/29/22 ?1529 04/29/22 ?  1535  ?NA 129*  --   ?K 5.4*  --   ?CL 106  --   ?CO2 15*  --   ?GLUCOSE 98  --   ?BUN 37*  --   ?CREATININE 1.09*  --   ?CALCIUM 7.8*  --   ?MG  --  2.3  ? ?Liver Function Tests: ?Recent Labs  ?Lab 04/29/22 ?1529  ?AST 42*  ?ALT 29  ?ALKPHOS 207*  ?BILITOT 1.6*  ?PROT 5.3*  ?ALBUMIN 1.8*  ? ?No results for input(s): LIPASE, AMYLASE in the last 168 hours. ?Recent Labs  ?Lab 04/29/22 ?1535  ?AMMONIA 42*  ? ?CBC: ?Recent Labs  ?Lab 04/29/22 ?1529  ?WBC 22.1*  ?NEUTROABS 19.8*  ?HGB 10.8*  ?HCT 33.5*  ?MCV 98.0  ?PLT 184  ? ?Cardiac Enzymes: ?No results for input(s): CKTOTAL, CKMB, CKMBINDEX, TROPONINI in the last 168 hours. ? ?BNP (last 3 results) ?Recent Labs  ?  12/15/21 ?2215  ?BNP 305.0*  ? ? ?ProBNP (last 3 results) ?No results for input(s): PROBNP in the last 8760 hours. ? ?CBG: ?No results for input(s): GLUCAP in the last 168 hours. ? ?Radiological Exams on Admission: ?CT ABDOMEN PELVIS W CONTRAST ? ?Result Date: 04/29/2022 ?CLINICAL DATA:  Sepsis weakness diarrhea EXAM: CT ABDOMEN AND PELVIS  WITH CONTRAST TECHNIQUE: Multidetector CT imaging of the abdomen and pelvis was performed using the standard protocol following bolus administration of intravenous contrast. RADIATION DOSE REDUCTION: This exam was p

## 2022-05-01 DIAGNOSIS — D72829 Elevated white blood cell count, unspecified: Secondary | ICD-10-CM | POA: Diagnosis not present

## 2022-05-01 DIAGNOSIS — R188 Other ascites: Secondary | ICD-10-CM | POA: Diagnosis not present

## 2022-05-01 DIAGNOSIS — K7460 Unspecified cirrhosis of liver: Secondary | ICD-10-CM | POA: Diagnosis not present

## 2022-05-01 DIAGNOSIS — B9689 Other specified bacterial agents as the cause of diseases classified elsewhere: Secondary | ICD-10-CM | POA: Diagnosis present

## 2022-05-01 DIAGNOSIS — S81802A Unspecified open wound, left lower leg, initial encounter: Secondary | ICD-10-CM | POA: Diagnosis not present

## 2022-05-01 DIAGNOSIS — N390 Urinary tract infection, site not specified: Secondary | ICD-10-CM | POA: Diagnosis present

## 2022-05-01 MED ORDER — LACTULOSE 10 GM/15ML PO SOLN
30.0000 g | Freq: Every day | ORAL | Status: DC
  Administered 2022-05-01: 30 g via ORAL
  Filled 2022-05-01 (×2): qty 60

## 2022-05-01 MED ORDER — ONDANSETRON HCL 4 MG/2ML IJ SOLN
4.0000 mg | Freq: Four times a day (QID) | INTRAMUSCULAR | Status: DC | PRN
  Administered 2022-05-01 – 2022-05-04 (×4): 4 mg via INTRAVENOUS
  Filled 2022-05-01 (×5): qty 2

## 2022-05-01 NOTE — Progress Notes (Signed)
?PROGRESS NOTE ? ? ? ? ?Kristie Brooks, is a 56 y.o. female, DOB - 1966/01/29, OFB:[id number] ? ?Admit date - 04/29/2022   Admitting Physician Bernadette Hoit, DO ? ?Outpatient Primary MD for the patient is Keane Police, MD ? ?LOS - 1 ? ?Chief Complaint  ?Patient presents with  ? Weakness  ?    ?Brief Summary:- ?56 y.o. female with medical history significant of chronic respiratory failure on 2 LPM of oxygen, hypothyroidism, liver cirrhosis, bilateral lower extremity venous stasis dermatitis, right upper extremity DVT on Eliquis admitted on 04/30/2022 with electrolyte abnormalities, transaminitis and concerns about cellulitis/infected wounds in the setting of liver cirrhosis with large volume ascites ?  ?  ?-Assessment and Plan: ? 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 bili is down to 1.4 from 1.6, alk phos is down to 170 from 207 ?-Lactulose as ordered ?  ?2)Decubitus Ulcers/Pressure injury----POA ?-Rule out superimposed cellulitis ?Vancomycin and Rocephin as ordered ?  ?3) Klebsiella UTI--continue IV Rocephin ? ?4) Streptococcus Gallolyticus bacteremia--??? ?-Currently on IV Rocephin and vancomycin continue same ?-Repeat blood cultures ?  ?5) chronic hypotension--- continue midodrine for pressure support ?  ?6) chronic pain syndrome--- continue oxycodone, add Cymbalta ?  ?7) hypothyroidism--- continue levothyroxine 50 mcg daily ?  ?8)GERD--- continue Protonix ?  ?9) hyponatremia----chronic in the setting of underlying liver cirrhosis ?-Avoid excessive free water ?  ?10) leukocytosis--- suspect this is related to #1 #2 above ?-WBC is down to 19.6 from 22.1 ?  ?11) chronic anemia and chronic thrombocytopenia--- related to underlying liver disease/cirrhosis ?-No obvious bleeding at this time ?Monitor closely as patient is on Eliquis for  prior DVT ? ?12)H/o Right Upper Extremity DVT--continue local ?  ?13) chronic hypoxic respiratory failure--- continue-nasal cannula 2 L/min ?-Oxygen requirement appears to be at baseline at this time ? ?14) depression/anxiety/chronic pain----start Cymbalta ? ?15) pressure injury---POA ?-Chronic, nonhealing full thickness wound to left lateral LE, small chronic nonhealing wound to RLE, bilateral buttocks with chronic friction injury. ?-Wound care consult appreciated recommends:- Gerhart's Butt Cream is to be applied to the buttocks, ?- this 1:1:1 antifungal:hydrocortisone:zinc oxide preparation will assist with resolution of the buttock injuries.  Turning and repositioning and the floatation of heels for PI prevention. ?-- Apply alginate dressings, ABD and Kerlix to LLE wound ?-Outpatient follow-up with [facility] wound clinic advised ? ?Disposition/Need for in-Hospital Stay- patient unable to be discharged at this time due to -Klebsiella UTI and possible strep bacteremia requiring IV antibiotics ? ?Status is: Inpatient  ? ?Disposition: The patient is from: Home ?             Anticipated d/c is to: Home ?             Anticipated d/c date is: 2 days ?             Patient currently is not medically stable to d/c. ?Barriers: Not Clinically Stable-  ? ?Code Status :  -  Code Status: Full Code  ? ?Family Communication:   (patient is alert, awake and coherent)  ?Discussed with patient's sister-in-law Debbie at bedside ? ?DVT Prophylaxis  :   - SCDs  SCDs Start: 04/30/22 0246 ?apixaban (ELIQUIS) tablet 5 mg  ? ?Lab Results  ?Component Value Date  ? PLT 125 (L) 04/30/2022  ? ? ?Inpatient Medications ? ?Scheduled Meds: ? apixaban  5 mg Oral BID  ? DULoxetine  30 mg Oral Daily  ? feeding supplement  237 mL Oral BID BM  ? folic acid  1 mg Oral Daily  ? Gerhardt's butt cream   Topical TID  ? levothyroxine  50 mcg Oral Q0600  ? midodrine  10 mg Oral TID WC  ? nystatin   Topical BID  ? pantoprazole  40 mg Oral BID AC  ?  vitamin B-12  250 mcg Oral Daily  ? ?Continuous Infusions: ? cefTRIAXone (ROCEPHIN)  IV 2 g (04/30/22 1514)  ? vancomycin 1,250 mg (04/30/22 1620)  ? ?PRN Meds:.acetaminophen **OR** acetaminophen, ondansetron (ZOFRAN) IV, oxyCODONE ? ? ?Anti-infectives (From admission, onward)  ? ? Start     Dose/Rate Route Frequency Ordered Stop  ? 04/30/22 1600  cefTRIAXone (ROCEPHIN) 1 g in sodium chloride 0.9 % 100 mL IVPB  Status:  Discontinued       ? 1 g ?200 mL/hr over 30 Minutes Intravenous Every 24 hours 04/29/22 2313 04/30/22 0806  ? 04/30/22 1600  vancomycin (VANCOREADY) IVPB 1250 mg/250 mL       ? 1,250 mg ?166.7 mL/hr over 90 Minutes Intravenous Every 24 hours 04/29/22 2349    ? 04/30/22 1600  cefTRIAXone (ROCEPHIN) 2 g in sodium chloride 0.9 % 100 mL IVPB       ? 2 g ?200 mL/hr over 30 Minutes Intravenous Every 24 hours 04/30/22 0806 05/07/22 1559  ? 04/29/22 1800  vancomycin (VANCOCIN) IVPB 1000 mg/200 mL premix       ? 1,000 mg ?200 mL/hr over 60 Minutes Intravenous  Once 04/29/22 1748 04/29/22 1940  ? 04/29/22 1800  cefTRIAXone (ROCEPHIN) 2 g in sodium chloride 0.9 % 100 mL IVPB       ? 2 g ?200 mL/hr over 30 Minutes Intravenous  Once 04/29/22 1757 04/29/22 1841  ? 04/29/22 1545  vancomycin (VANCOCIN) IVPB 1000 mg/200 mL premix       ? 1,000 mg ?200 mL/hr over 60 Minutes Intravenous  Once 04/29/22 1530 04/29/22 1757  ? 04/29/22 1545  levofloxacin (LEVAQUIN) IVPB 750 mg  Status:  Discontinued       ? 750 mg ?100 mL/hr over 90 Minutes Intravenous  Once 04/29/22 1530 04/29/22 1756  ? ?  ? ?  ? ?Subjective: ?Saroya Riccobono today has no fevers, no emesis,  No chest pain,   ?- ?No productive cough ?-Oral intake is okay, but not great ? ?Objective: ?Vitals:  ? 04/30/22 0730 04/30/22 0822 04/30/22 1954 05/01/22 0502  ?BP: 105/63 108/64 97/63 112/68  ?Pulse: 74 74 69 77  ?Resp: 20 17 16 16   ?Temp:  98.2 ?F (36.8 ?C) 98.4 ?F (36.9 ?C) 97.9 ?F (36.6 ?C)  ?TempSrc:   Oral Oral  ?SpO2: 99% 99% 99% 97%  ?Weight:      ?Height:       ? ? ?Intake/Output Summary (Last 24 hours) at 05/01/2022 1314 ?Last data filed at 04/30/2022 3149 ?Gross per 24 hour  ?Intake 350 ml  ?Output --  ?Net 350 ml  ? ?Filed Weights  ? 04/29/22 1710  ?Weight: 104 kg  ? ? ?Physical Exam ? ?Gen:- Awake Alert, chronically ill-appearing, ?HEENT:- [place].AT, No sclera icterus ?Neck-Supple Neck,No JVD,.  ?Lungs-  CTAB , fair symmetrical air movement ?CV- S1, S2 normal, regular  ?Abd-  +ve B.Sounds, Abd Soft, No tenderness,    ?Psych-affect is appropriate, oriented x3 ?Neuro-generalized weakness, no new focal deficits, no tremors ?Skin--- sacral and left leg pressure injury ? ?  Data Reviewed: I have personally reviewed following labs and imaging studies ? ?CBC: ?Recent Labs  ?Lab 04/29/22 ?1529 04/30/22 ?6503  ?WBC 22.1* 19.6*  ?NEUTROABS 19.8*  --   ?HGB 10.8* 8.9*  ?HCT 33.5* 27.0*  ?MCV 98.0 96.1  ?PLT 184 125*  ? ?Basic Metabolic Panel: ?Recent Labs  ?Lab 04/29/22 ?1529 04/29/22 ?1535 04/30/22 ?5465 04/30/22 ?1131  ?NA 129*  --  126* 128*  ?K 5.4*  --  4.7 4.8  ?CL 106  --  106 105  ?CO2 15*  --  15* 15*  ?GLUCOSE 98  --  89 94  ?BUN 37*  --  33* 34*  ?CREATININE 1.09*  --  0.93 1.04*  ?CALCIUM 7.8*  --  7.1* 7.6*  ?MG  --  2.3 2.1  --   ?PHOS  --   --  3.8  --   ? ?GFR: ?Estimated Creatinine Clearance: 71.4 mL/min (A) (by C-G formula based on SCr of 1.04 mg/dL (H)). ?Liver Function Tests: ?Recent Labs  ?Lab 04/29/22 ?1529 04/30/22 ?6812  ?AST 42* 31  ?ALT 29 23  ?ALKPHOS 207* 170*  ?BILITOT 1.6* 1.4*  ?PROT 5.3* 4.0*  ?ALBUMIN 1.8* <1.5*  ? ?Cardiac Enzymes: ?No results for input(s): CKTOTAL, CKMB, CKMBINDEX, TROPONINI in the last 168 hours. ?BNP (last 3 results) ?No results for input(s): PROBNP in the last 8760 hours. ?HbA1C: ?No results for input(s): HGBA1C in the last 72 hours. ?Sepsis Labs: ?@LABRCNTIP (procalcitonin:4,lacticidven:4) ?) ?Recent Results (from the past 240 hour(s))  ?Blood Culture (routine x 2)     Status: Abnormal (Preliminary result)  ? Collection Time:  04/29/22  3:29 PM  ? Specimen: BLOOD  ?Result Value Ref Range Status  ? Specimen Description   Final  ?  BLOOD BLOOD LEFT FOREARM ?Performed at [facility], [address] [place], [place] [address] ?  ? Special

## 2022-05-02 ENCOUNTER — Inpatient Hospital Stay: Payer: Self-pay

## 2022-05-02 DIAGNOSIS — E7220 Disorder of urea cycle metabolism, unspecified: Secondary | ICD-10-CM | POA: Diagnosis not present

## 2022-05-02 DIAGNOSIS — E039 Hypothyroidism, unspecified: Secondary | ICD-10-CM | POA: Diagnosis not present

## 2022-05-02 DIAGNOSIS — S81802A Unspecified open wound, left lower leg, initial encounter: Secondary | ICD-10-CM | POA: Diagnosis not present

## 2022-05-02 DIAGNOSIS — K7460 Unspecified cirrhosis of liver: Secondary | ICD-10-CM | POA: Diagnosis not present

## 2022-05-02 LAB — URINE CULTURE: Culture: 2000 — AB

## 2022-05-02 LAB — CULTURE, BLOOD (ROUTINE X 2): Special Requests: ADEQUATE

## 2022-05-02 MED ORDER — LACTULOSE 10 GM/15ML PO SOLN
20.0000 g | Freq: Three times a day (TID) | ORAL | Status: DC
  Administered 2022-05-02 – 2022-05-05 (×5): 20 g via ORAL
  Filled 2022-05-02 (×9): qty 30

## 2022-05-02 NOTE — Progress Notes (Signed)
?PROGRESS NOTE ? ? ? ? ?Kristie Brooks, is a 56 y.o. female, DOB - 01-03-1966, OMA:[id number] ? ?Admit date - 04/29/2022   Admitting Physician Bernadette Hoit, DO ? ?Outpatient Primary MD for the patient is Keane Police, MD ? ?LOS - 2 ? ?Chief Complaint  ?Patient presents with  ? Weakness  ?    ?Brief Summary:- ?56 y.o. female with medical history significant of chronic respiratory failure on 2 LPM of oxygen, hypothyroidism, liver cirrhosis, bilateral lower extremity venous stasis dermatitis, right upper extremity DVT on Eliquis admitted on 04/30/2022 with electrolyte abnormalities, transaminitis and concerns about cellulitis/infected wounds in the setting of liver cirrhosis with large volume ascites ?  ?  ?-Assessment and Plan: ? 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 bili is down to 1.4 from 1.6, alk phos is down to 170 from 207 ?-Lactulose as ordered ?-Repeat CBC and CMP in the a.m. ?  ?2)Decubitus Ulcers/Pressure injury----POA ?-Rule out superimposed cellulitis ?Stop vancomycin on 05/12/2022 ?continue Rocephin  ?  ?3)Klebsiella UTI--continue IV Rocephin ? ?4)Streptococcus Gallolyticus Bacteremia--??? ?-Currently on IV Rocephin and vancomycin continue same ?-Repeat blood cultures from 05/01/22 NGTD ?  ?5)Chronic Hypotension--- continue midodrine for pressure support ?  ?6)Chronic pain syndrome--- continue oxycodone, add Cymbalta ?  ?7)Hypothyroidism--- continue levothyroxine 50 mcg daily ?  ?8)GERD--- continue Protonix ?  ?9)Hyponatremia----chronic in the setting of underlying liver cirrhosis ?-Avoid excessive free water ?  ?10)Leukocytosis--- suspect this is related to #1 #2 above ?-WBC is down to 19.6 from 22.1 ?  ?11)Chronic Anemia and chronic thrombocytopenia--- related to underlying liver disease/cirrhosis ?-No obvious bleeding at  this time ?Monitor closely as patient is on Eliquis for prior DVT ? ?12)H/o Right Upper Extremity DVT--continue local ?  ?13)Chronic hypoxic respiratory failure--- continue-nasal cannula 2 L/min ?-Oxygen requirement appears to be at baseline at this time ? ?14)Depression/anxiety/chronic pain----start Cymbalta ? ?15)Pressure injury---POA ?-Chronic, nonhealing full thickness wound to left lateral LE, small chronic nonhealing wound to RLE, bilateral buttocks with chronic friction injury. ?-Wound care consult appreciated recommends:- Gerhart's Butt Cream is to be applied to the buttocks, ?- this 1:1:1 antifungal:hydrocortisone:zinc oxide preparation will assist with resolution of the buttock injuries.  Turning and repositioning and the floatation of heels for PI prevention. ?-- Apply alginate dressings, ABD and Kerlix to LLE wound ?-Outpatient follow-up with [facility] wound clinic advised ? ?Disposition/Need for in-Hospital Stay- patient unable to be discharged at this time due to -Klebsiella UTI and possible strep bacteremia requiring IV antibiotics ? ?Status is: Inpatient  ? ?Disposition: The patient is from: Home ?             Anticipated d/c is to: Home ?             Anticipated d/c date is: 2 days ?             Patient currently is not medically stable to d/c. ?Barriers: Not Clinically Stable-  ? ?Code Status :  -  Code Status: Full Code  ? ?Family Communication:   (patient is alert, awake and coherent)  ?Discussed with patient's sister-in-law Debbie at bedside ? ?DVT Prophylaxis  :   - SCDs  SCDs Start: 04/30/22 0246 ?apixaban (ELIQUIS) tablet 5 mg  ? ?Lab Results  ?Component Value Date  ? PLT 125 (L) 04/30/2022  ? ?Inpatient Medications ? ?Scheduled Meds: ? apixaban  5  mg Oral BID  ? DULoxetine  30 mg Oral Daily  ? feeding supplement  237 mL Oral BID BM  ? folic acid  1 mg Oral Daily  ? Gerhardt's butt cream   Topical TID  ? lactulose  20 g Oral TID  ? levothyroxine  50 mcg Oral Q0600  ? midodrine  10 mg  Oral TID WC  ? nystatin   Topical BID  ? pantoprazole  40 mg Oral BID AC  ? vitamin B-12  250 mcg Oral Daily  ? ?Continuous Infusions: ? cefTRIAXone (ROCEPHIN)  IV 2 g (05/02/22 1618)  ? ?PRN Meds:.acetaminophen **OR** acetaminophen, ondansetron (ZOFRAN) IV, oxyCODONE ? ? ?Anti-infectives (From admission, onward)  ? ? Start     Dose/Rate Route Frequency Ordered Stop  ? 04/30/22 1600  cefTRIAXone (ROCEPHIN) 1 g in sodium chloride 0.9 % 100 mL IVPB  Status:  Discontinued       ? 1 g ?200 mL/hr over 30 Minutes Intravenous Every 24 hours 04/29/22 2313 04/30/22 0806  ? 04/30/22 1600  vancomycin (VANCOREADY) IVPB 1250 mg/250 mL  Status:  Discontinued       ? 1,250 mg ?166.7 mL/hr over 90 Minutes Intravenous Every 24 hours 04/29/22 2349 05/02/22 0941  ? 04/30/22 1600  cefTRIAXone (ROCEPHIN) 2 g in sodium chloride 0.9 % 100 mL IVPB       ? 2 g ?200 mL/hr over 30 Minutes Intravenous Every 24 hours 04/30/22 0806 05/07/22 1559  ? 04/29/22 1800  vancomycin (VANCOCIN) IVPB 1000 mg/200 mL premix       ? 1,000 mg ?200 mL/hr over 60 Minutes Intravenous  Once 04/29/22 1748 04/29/22 1940  ? 04/29/22 1800  cefTRIAXone (ROCEPHIN) 2 g in sodium chloride 0.9 % 100 mL IVPB       ? 2 g ?200 mL/hr over 30 Minutes Intravenous  Once 04/29/22 1757 04/29/22 1841  ? 04/29/22 1545  vancomycin (VANCOCIN) IVPB 1000 mg/200 mL premix       ? 1,000 mg ?200 mL/hr over 60 Minutes Intravenous  Once 04/29/22 1530 04/29/22 1757  ? 04/29/22 1545  levofloxacin (LEVAQUIN) IVPB 750 mg  Status:  Discontinued       ? 750 mg ?100 mL/hr over 90 Minutes Intravenous  Once 04/29/22 1530 04/29/22 1756  ? ?  ? ? Subjective: ?Kristie Brooks today has no fevers, no emesis,  No chest pain,   ?- ?Fatigue and malaise continue- ?--significant difficulty with blood draws today for labs ?-May need PICC line if repeat blood cultures are negative on 05/03/2022 ? ?Objective: ?Vitals:  ? 05/01/22 1339 05/01/22 2017 05/02/22 0451 05/02/22 1434  ?BP: 117/67 (!) 111/59 106/62 117/67   ?Pulse: 70 68 63 69  ?Resp: 18 18 18    ?Temp: 97.6 ?F (36.4 ?C) 98.4 ?F (36.9 ?C)  (!) 97.5 ?F (36.4 ?C)  ?TempSrc:  Oral  Oral  ?SpO2: 99% 97% 99% 98%  ?Weight:      ?Height:      ? ? ?Intake/Output Summary (Last 24 hours) at 05/02/2022 1750 ?Last data filed at 05/02/2022 1300 ?Gross per 24 hour  ?Intake 240 ml  ?Output --  ?Net 240 ml  ? ?Filed Weights  ? 04/29/22 1710  ?Weight: 104 kg  ? ? ?Physical Exam ? ?Gen:- Awake Alert, chronically ill-appearing, ?HEENT:- [place].AT, No sclera icterus ?Neck-Supple Neck,No JVD,.  ?Lungs-  CTAB , fair symmetrical air movement ?CV- S1, S2 normal, regular  ?Abd-  +ve B.Sounds, Abd Soft, No tenderness,    ?Psych-affect is  appropriate, oriented x3 ?Neuro-generalized weakness, no new focal deficits, no tremors ?Skin--- sacral and left leg pressure injury--see photos in epic ? ?Data Reviewed: I have personally reviewed following labs and imaging studies ? ?CBC: ?Recent Labs  ?Lab 04/29/22 ?1529 04/30/22 ?4166  ?WBC 22.1* 19.6*  ?NEUTROABS 19.8*  --   ?HGB 10.8* 8.9*  ?HCT 33.5* 27.0*  ?MCV 98.0 96.1  ?PLT 184 125*  ? ?Basic Metabolic Panel: ?Recent Labs  ?Lab 04/29/22 ?1529 04/29/22 ?1535 04/30/22 ?0630 04/30/22 ?1131  ?NA 129*  --  126* 128*  ?K 5.4*  --  4.7 4.8  ?CL 106  --  106 105  ?CO2 15*  --  15* 15*  ?GLUCOSE 98  --  89 94  ?BUN 37*  --  33* 34*  ?CREATININE 1.09*  --  0.93 1.04*  ?CALCIUM 7.8*  --  7.1* 7.6*  ?MG  --  2.3 2.1  --   ?PHOS  --   --  3.8  --   ? ?GFR: ?Estimated Creatinine Clearance: 71.4 mL/min (A) (by C-G formula based on SCr of 1.04 mg/dL (H)). ?Liver Function Tests: ?Recent Labs  ?Lab 04/29/22 ?1529 04/30/22 ?1601  ?AST 42* 31  ?ALT 29 23  ?ALKPHOS 207* 170*  ?BILITOT 1.6* 1.4*  ?PROT 5.3* 4.0*  ?ALBUMIN 1.8* <1.5*  ? ?Cardiac Enzymes: ?No results for input(s): CKTOTAL, CKMB, CKMBINDEX, TROPONINI in the last 168 hours. ?BNP (last 3 results) ?No results for input(s): PROBNP in the last 8760 hours. ?HbA1C: ?No results for input(s): HGBA1C in the last 72  hours. ?Sepsis Labs: ?@LABRCNTIP (procalcitonin:4,lacticidven:4) ?) ?Recent Results (from the past 240 hour(s))  ?Blood Culture (routine x 2)     Status: Abnormal  ? Collection Time: 04/29/22  3:29 PM  ? Specimen:

## 2022-05-02 NOTE — Progress Notes (Signed)
Lab unable to get a blood draw on patient this am. Lab technician suggests PICC line insertion. Patient's mouth is dry despite swabbing with water and sipping on water and ginger ale.  ?

## 2022-05-02 NOTE — Plan of Care (Signed)
  Problem: Education: Goal: Knowledge of General Education information will improve Description Including pain rating scale, medication(s)/side effects and non-pharmacologic comfort measures Outcome: Progressing   Problem: Health Behavior/Discharge Planning: Goal: Ability to manage health-related needs will improve Outcome: Progressing   

## 2022-05-02 NOTE — TOC Progression Note (Signed)
?  Transition of Care (TOC) Screening Note ? ? ?Patient Details  ?Name: Kristie Brooks [facility] ?Date of Birth: 07/18/1966 ? ? ?Transition of Care (TOC) CM/SW Contact:    ?Boneta Lucks, RN ?Phone Number: ?05/02/2022, 2:40 PM ? ?Patient needs assessment, TOC waiting on Palliative and spiritual consults, Patient will be her 2/3 days. ? ?Transition of Care Department [facility]) has reviewed patient and no TOC needs have been identified at this time. We will continue to monitor patient advancement through interdisciplinary progression rounds. If new patient transition needs arise, please place a TOC consult. ? ? ? ? ?  ?Barriers to Discharge: Continued Medical Work up, Other (must enter comment) (Palliative and Spirtual consulted) ? ?Expected Discharge Plan and Services ?  ?

## 2022-05-03 ENCOUNTER — Inpatient Hospital Stay (HOSPITAL_COMMUNITY): Payer: Managed Care, Other (non HMO)

## 2022-05-03 ENCOUNTER — Encounter (HOSPITAL_COMMUNITY): Payer: Self-pay | Admitting: Internal Medicine

## 2022-05-03 DIAGNOSIS — Z515 Encounter for palliative care: Secondary | ICD-10-CM

## 2022-05-03 DIAGNOSIS — A419 Sepsis, unspecified organism: Secondary | ICD-10-CM

## 2022-05-03 DIAGNOSIS — Z7189 Other specified counseling: Secondary | ICD-10-CM

## 2022-05-03 DIAGNOSIS — N390 Urinary tract infection, site not specified: Secondary | ICD-10-CM

## 2022-05-03 DIAGNOSIS — B9689 Other specified bacterial agents as the cause of diseases classified elsewhere: Secondary | ICD-10-CM

## 2022-05-03 DIAGNOSIS — R6520 Severe sepsis without septic shock: Secondary | ICD-10-CM

## 2022-05-03 DIAGNOSIS — E871 Hypo-osmolality and hyponatremia: Secondary | ICD-10-CM

## 2022-05-03 DIAGNOSIS — I82A11 Acute embolism and thrombosis of right axillary vein: Secondary | ICD-10-CM

## 2022-05-03 DIAGNOSIS — S81802A Unspecified open wound, left lower leg, initial encounter: Secondary | ICD-10-CM | POA: Diagnosis not present

## 2022-05-03 LAB — COMPREHENSIVE METABOLIC PANEL
ALT: 23 U/L (ref 0–44)
AST: 33 U/L (ref 15–41)
Albumin: 1.7 g/dL — ABNORMAL LOW (ref 3.5–5.0)
Alkaline Phosphatase: 165 U/L — ABNORMAL HIGH (ref 38–126)
Anion gap: 7 (ref 5–15)
BUN: 30 mg/dL — ABNORMAL HIGH (ref 6–20)
CO2: 14 mmol/L — ABNORMAL LOW (ref 22–32)
Calcium: 7.9 mg/dL — ABNORMAL LOW (ref 8.9–10.3)
Chloride: 109 mmol/L (ref 98–111)
Creatinine, Ser: 0.98 mg/dL (ref 0.44–1.00)
GFR, Estimated: >60 mL/min (ref >60)
Glucose, Bld: 74 mg/dL (ref 70–99)
Potassium: 4.1 mmol/L (ref 3.5–5.1)
Sodium: 130 mmol/L — ABNORMAL LOW (ref 135–145)
Total Bilirubin: 0.9 mg/dL (ref 0.3–1.2)
Total Protein: 4.8 g/dL — ABNORMAL LOW (ref 6.5–8.1)

## 2022-05-03 LAB — AMMONIA: Ammonia: 13 umol/L (ref 9–35)

## 2022-05-03 LAB — BODY FLUID CELL COUNT WITH DIFFERENTIAL
Eos, Fluid: 0 %
Lymphs, Fluid: 20 %
Monocyte-Macrophage-Serous Fluid: 17 % — ABNORMAL LOW (ref 50–90)
Neutrophil Count, Fluid: 63 % — ABNORMAL HIGH (ref 0–25)
Total Nucleated Cell Count, Fluid: 98 cu mm (ref 0–1000)

## 2022-05-03 LAB — GRAM STAIN: Gram Stain: NONE SEEN

## 2022-05-03 LAB — CBC
HCT: 27.6 % — ABNORMAL LOW (ref 36.0–46.0)
Hemoglobin: 9 g/dL — ABNORMAL LOW (ref 12.0–15.0)
MCH: 31.5 pg (ref 26.0–34.0)
MCHC: 32.6 g/dL (ref 30.0–36.0)
MCV: 96.5 fL (ref 80.0–100.0)
Platelets: 142 10*3/uL — ABNORMAL LOW (ref 150–400)
RBC: 2.86 MIL/uL — ABNORMAL LOW (ref 3.87–5.11)
RDW: 15.9 % — ABNORMAL HIGH (ref 11.5–15.5)
WBC: 8.6 10*3/uL (ref 4.0–10.5)
nRBC: 0 % (ref 0.0–0.2)

## 2022-05-03 LAB — PROTIME-INR
INR: 1.7 — ABNORMAL HIGH (ref 0.8–1.2)
Prothrombin Time: 19.5 seconds — ABNORMAL HIGH (ref 11.4–15.2)

## 2022-05-03 NOTE — Progress Notes (Signed)
?   05/03/22 1400  ?Clinical Encounter Type  ?Visited With Patient and family together  ?Visit Type Spiritual support  ?Referral From Physician  ?Consult/Referral To Chaplain  ?Spiritual Encounters  ?Spiritual Needs Emotional;Prayer  ?Stress Factors  ?Patient Stress Factors Major life changes;Loss of control;Exhausted  ?Advance Directives (For Healthcare)  ?Does Patient Have a Medical Advance Directive? No  ?Would patient like information on creating a medical advance directive? No - Patient declined  ?Mental Health Advance Directives  ?Does Patient Have a Mental Health Advance Directive? No  ?Would patient like information on creating a mental health advance directive? No - Patient declined  ? ? ?Referred to patient by physician to evaluate for spiritual/emotional needs. Patient introduced Chaplain to husband and she welcomed Chaplain warmly bedside. 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 She was quite firm in saying that she is not depressed but that she is scared. Her fear is mainly because of the long term affects of being seriously ill and she is afraid that she is loosing her battle to live longer on this earth. She is scared of the unknown and although she has a strong [place] faith, she is having a hard time trusting her body again. She feels disconnected from her body due to long term illness and is very tired. This Probation officer provided a safe space for her to reflect today and normalized her fear. Chaplain provided a blessing bedside and will continue to visit in order to provide spiritual support and to assess for spiritual need. Husband remains supportive.  ? ?Rev. Bennie Pierini, M.Div. ?Chaplain  ?[phone] ? ?

## 2022-05-03 NOTE — [place] FL2 (Signed)
?[place] MEDICAID FL2 LEVEL OF CARE SCREENING TOOL  ?  ? ?IDENTIFICATION  ?Patient Name: ?Kristie Brooks Birthdate: 27-Dec-1965 Sex: female Admission Date (Current Location): ?04/29/2022  ?[place] and [place] Number: ? [place] and Address:  ?[place] [address], [place] ?     Provider Number: ?[id number]  ?Attending Physician Name and Address:  ?Roxan Hockey, MD ? Relative Name and Phone Number:  ?  ?   ?Current Level of Care: ?  Recommended Level of Care: ?[place] Prior Approval Number: ?  ? ?Date Approved/Denied: ?  PASRR Number: ?[id number] A ? ?Discharge Plan: ?SNF ?  ? ?Current Diagnoses: ?Patient Active Problem List  ? Diagnosis Date Noted  ? UTI due to Klebsiella species 05/01/2022  ? Open wound of left lower extremity 04/30/2022  ? Leukocytosis 04/30/2022  ? Hyperammonemia ([place]) 04/30/2022  ? Hyperkalemia 04/30/2022  ? Lactic acidosis 04/30/2022  ? Obesity (BMI 30-39.9) 04/30/2022  ? Hypothyroidism 01/05/2022  ? GERD (gastroesophageal reflux disease) 01/05/2022  ? DVT (deep venous thrombosis) ([place]) 01/04/2022  ? Sepsis due to undetermined organism ([place]) 12/22/2021  ? Cellulitis, leg 12/22/2021  ? Hematemesis   ? Cirrhosis of liver with ascites ([place])   ? Ascites   ? Anasarca   ? Pressure injury of skin 12/18/2021  ? Sepsis ([place]) 12/16/2021  ? ? ?Orientation RESPIRATION BLADDER Height & Weight   ?  ?Self, Time, Situation, Place ? Normal External catheter, Continent Weight: 104 kg ?Height:  '5\' 5"'$  (165.1 cm)  ?BEHAVIORAL SYMPTOMS/MOOD NEUROLOGICAL BOWEL NUTRITION STATUS  ?Wanderer   Continent Diet (See DC summary)  ?AMBULATORY STATUS COMMUNICATION OF NEEDS Skin   ?Extensive Assist Verbally Bruising ?  ?  ?  ?    ?     ?     ? ? ?Personal Care Assistance Level of Assistance  ?Bathing, Feeding, Dressing Bathing Assistance: Maximum assistance ?Feeding assistance: Limited assistance ?Dressing Assistance: Maximum assistance ?   ? ?Functional  Limitations Info  ?Sight, Hearing, Speech Sight Info: Adequate ?Hearing Info: Adequate ?Speech Info: Adequate  ? ? ?SPECIAL CARE FACTORS FREQUENCY  ?PT (By licensed PT)   ?  ?PT Frequency: 5 times a week ?  ?  ?  ?  ?   ? ? ?Contractures Contractures Info: Not present  ? ? ?Additional Factors Info  ?Code Status, Allergies Code Status Info: FULL ?Allergies Info: Kakin's sodium hypochlorite, Zosyn ?  ?  ?  ?   ? ?Current Medications (05/03/2022):  This is the current hospital active medication list ?Current Facility-Administered Medications  ?Medication Dose Route Frequency Provider Last Rate Last Admin  ? acetaminophen (TYLENOL) tablet 650 mg  650 mg Oral Q6H PRN Bernadette Hoit, DO      ? Or  ? acetaminophen (TYLENOL) suppository 650 mg  650 mg Rectal Q6H PRN Adefeso, Oladapo, DO      ? apixaban (ELIQUIS) tablet 5 mg  5 mg Oral BID Adefeso, Oladapo, DO   5 mg at 05/03/22 0950  ? cefTRIAXone (ROCEPHIN) 2 g in sodium chloride 0.9 % 100 mL IVPB  2 g Intravenous Q24H Emokpae, Courage, MD 200 mL/hr at 05/02/22 1618 2 g at 05/02/22 1618  ? DULoxetine (CYMBALTA) DR capsule 30 mg  30 mg Oral Daily Denton Brick, Courage, MD   30 mg at 05/03/22 0949  ? feeding supplement (ENSURE ENLIVE / ENSURE PLUS) liquid 237 mL  237 mL Oral BID BM Adefeso, Oladapo, DO      ?  folic acid (FOLVITE) tablet 1 mg  1 mg Oral Daily Adefeso, Oladapo, DO   1 mg at 05/03/22 0950  ? Gerhardt's butt cream   Topical TID Roxan Hockey, MD   Given at 05/03/22 [phone]  ? lactulose (CHRONULAC) 10 GM/15ML solution 20 g  20 g Oral TID Adefeso, Oladapo, DO   20 g at 05/02/22 1644  ? levothyroxine (SYNTHROID) tablet 50 mcg  50 mcg Oral Q0600 Adefeso, Oladapo, DO      ? midodrine (PROAMATINE) tablet 10 mg  10 mg Oral TID WC Adefeso, Oladapo, DO   10 mg at 05/03/22 0950  ? nystatin (MYCOSTATIN/NYSTOP) topical powder   Topical BID Roxan Hockey, MD   Given at 05/03/22 [phone]  ? ondansetron (ZOFRAN) injection 4 mg  4 mg Intravenous Q6H PRN Adefeso, Oladapo, DO   4 mg at  05/01/22 0241  ? oxyCODONE (Oxy IR/ROXICODONE) immediate release tablet 5 mg  5 mg Oral Q8H PRN Roxan Hockey, MD   5 mg at 05/02/22 2055  ? pantoprazole (PROTONIX) EC tablet 40 mg  40 mg Oral BID AC Adefeso, Oladapo, DO   40 mg at 05/03/22 0950  ? vitamin B-12 (CYANOCOBALAMIN) tablet 250 mcg  250 mcg Oral Daily Adefeso, Oladapo, DO   250 mcg at 05/03/22 7494  ? ? ? ?Discharge Medications: ?Please see discharge summary for a list of discharge medications. ? ?Relevant Imaging Results: ? ?Relevant Lab Results: ? ? ?Additional Information ?SS# [id number] ? ?Boneta Lucks, RN ? ? ? ? ?

## 2022-05-03 NOTE — Plan of Care (Signed)

## 2022-05-03 NOTE — Plan of Care (Signed)
?  Problem: Acute Rehab PT Goals(only PT should resolve) ?Goal: Pt Will Go Supine/Side To Sit ?Outcome: Progressing ?Flowsheets (Taken 05/03/2022 1135) ?Pt will go Supine/Side to Sit: ? with minimal assist ? with moderate assist ?Goal: Patient Will Transfer Sit To/From Stand ?Outcome: Progressing ?Flowsheets (Taken 05/03/2022 1135) ?Patient will transfer sit to/from stand: ? with minimal assist ? with moderate assist ?Goal: Pt Will Transfer Bed To Chair/Chair To Bed ?Outcome: Progressing ?Flowsheets (Taken 05/03/2022 1135) ?Pt will Transfer Bed to Chair/Chair to Bed: ? with min assist ? with mod assist ?Goal: Pt Will Ambulate ?Outcome: Progressing ?Flowsheets (Taken 05/03/2022 1135) ?Pt will Ambulate: ? 15 feet ? with moderate assist ? with rolling walker ?  ?11:36 AM, 05/03/22 ?Lonell Grandchild, MPT ?Physical Therapist with [place] ?[facility] ?[phone] office ?[phone] mobile phone ? ?

## 2022-05-03 NOTE — Procedures (Signed)
PreOperative Dx: Cirrhosis, ascites ?Postoperative Dx: Cirrhosis, ascites ?Procedure:   US guided paracentesis ?Radiologist:  Thornton Papas ?Anesthesia:  10 ml of1% lidocaine ?Specimen:  2.8 L of yellow ascitic fluid ?EBL:   < 1 ml ?Complications: None   ?

## 2022-05-03 NOTE — Consult Note (Signed)
? ?Palliative Care Consult Note  ?                                ?Date: 05/03/2022  ? ?Patient Name: Kristie Brooks  ?DOB: 14-Mar-1966  MRN: [id number]  Age / Sex: 56 y.o., female  ?PCP: Keane Police, MD ?Referring Physician: Roxan Hockey, MD ? ?Reason for Consultation: Establishing goals of care ? ?HPI/Patient Profile: 56 y.o. female  with past medical history of chronic respiratory failure on 2 LPM of oxygen, hypothyroidism, liver cirrhosis, bilateral lower extremity venous stasis dermatitis, right upper extremity DVT on Eliquis admitted on 04/30/2022 with electrolyte abnormalities, transaminitis and concerns about cellulitis/infected wounds in the setting of liver cirrhosis with large volume ascites. ? ?PMT was consulted for [place] conversations. The patient was previously seen by PMT in December of 2022 by Quinn Axe, NP. At that time the patient indicated her desire for full scope/full code.  She stated she would accept a trach and PEG tube if it became necessary.  Plans were for outpatient skilled nursing facility/rehab and considering outpatient palliative care services. ? ?Past Medical History:  ?Diagnosis Date  ?? Arthritis   ?? Basal cell carcinoma 05/18/2021  ? nod-mid forehead (MOHS)  ?? GERD (gastroesophageal reflux disease)   ?? Hyperthyroidism   ?? Peripheral vascular disease ([place])   ?? Pneumonia   ? ? ?Subjective:  ? ?This NP Walden Field reviewed medical records, received report from team, assessed the patient and then meet at the patient's bedside to discuss diagnosis, prognosis, GOC, EOL wishes disposition and options. ? ?I met with the patient at the bedside.  No family was present.  When I met her she appeared very frail and weak. ?  ?Concept of Palliative Care was introduced as specialized medical care for people and their families living with serious illness.  If focuses on providing relief from the symptoms and stress of a serious illness.   The goal is to improve quality of life for both the patient and the family. Values and goals of care important to patient and family were attempted to be elicited. ? ?Created space and opportunity for patient  and family to explore thoughts and feelings regarding current medical situation ?  ?Natural trajectory and current clinical status were discussed. Questions and concerns addressed. Patient  encouraged to call with questions or concerns.   ? ?Patient/Family Understanding of Illness: ?She understands she has cirrhosis and has been referred to the transplant center at [facility] with a pending evaluation in July.  She is chronically seen [facility] gastro[facility] for her cirrhosis management.  She notes significant weakness over the past 2 weeks that was sudden; she states "I got up and just could not walk."  We also discussed her acute infection, chronic wounds of her legs, edema, and pain from her wounds (lower extremities and sacrum). ? ?At baseline, prior to hospitalization, she was independent in bathing, brushing her teeth, and majority of ADLs.  She did require some assistance with ambulation including occasionally needing a walker and/or a wheelchair. ? ?We had a further discussion about her acute and chronic illnesses, trajectory of illness, and how these impact her current living situation and quality of life. ? 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 She is married to [name] for 16 years.  They have no children.  Her husband has an adult daughter who lives in [place]. ? ?  Patient Values: ?Deferred ? ?Goals: ?To get better and be able to go home eventually.  She does except that she will likely need short-term rehab. ? ?Today's Discussion: ?In addition to life review and discussion of acute and chronic illnesses as described above, we had further discussion on various topics.  We discussed her ascites and her paracentesis this morning.  She does have some slight soreness at this  time.  Some fluid studies are pending but the indication does not appear consistent with SBP.  She knows that she has an infection but is not sure where.  We discussed the infection could be cellulitis with her wounds, given the chronic infection status of her wounds.  We also discussed positive Klebsiella UTI with leukocytosis, which appears to be improving.  We also discussed her low sodium which often occurs with cirrhosis.  We described how all of these can contribute to her weakness. ? ?She discussed her hope that she is able to get better, do some rehab to get stronger, and go home.  She seems to be hopeful about liver transplant evaluation at [facility] in July.  We discussed the need to take care of herself, try to get stronger, and follow medical recommendations in order to support her candidacy for possible liver transplant. ? ?We discussed PMT recommendation for outpatient palliative care to continue to follow her along as her clinical status evolves.  She is excepted this.  She lives in [place]. ? ?I provided emotional general supportive therapeutic listening, empathy, sharing of stories, and other techniques.  I answered all questions and addressed all concerns to the best my ability. ? ?Review of Systems  ?Constitutional:  Positive for fatigue.  ?Gastrointestinal:  Positive for abdominal pain (paracentesis site). Negative for nausea and vomiting.  ?Skin:   ?     LE edema and discomfort ?Sacral wound discomfort  ? ?Objective:  ? ?Primary Diagnoses: ?Present on Admission: ?? Open wound of left lower extremity ?? GERD (gastroesophageal reflux disease) ?? DVT (deep venous thrombosis) ([place]) ?? Cirrhosis of liver with ascites ([place]) ?? UTI due to Klebsiella species ? ? ?Physical Exam ?Vitals and nursing note reviewed.  ?Constitutional:   ?   General: She is not in acute distress. ?   Appearance: She is ill-appearing.  ?   Comments: Appears weak, frail  ?HENT:  ?   Head: Normocephalic and atraumatic.   ?Cardiovascular:  ?   Rate and Rhythm: Normal rate.  ?Pulmonary:  ?   Effort: Pulmonary effort is normal. No respiratory distress.  ?   Breath sounds: No wheezing or rhonchi.  ?Abdominal:  ?   General: Abdomen is protuberant.  ?   Palpations: Abdomen is soft.  ?Skin: ?   General: Skin is warm and dry.  ?Neurological:  ?   General: No focal deficit present.  ?   Mental Status: She is alert.  ?Psychiatric:     ?   Mood and Affect: Mood normal.     ?   Behavior: Behavior normal.  ? ? ?Vital Signs:  ?BP 110/64 (BP Location: Left Arm)   Pulse 82   Temp (!) 97.5 ?F (36.4 ?C) (Oral)   Resp 20   Ht 5' 5"  (1.651 m)   Wt 104 kg   SpO2 100%   BMI 38.15 kg/m?  ? ?Palliative Assessment/Data: 50% ? ? ? ?Advanced Care Planning:  ? ?Primary Decision Maker: ?PATIENT ? ?Code Status/Advance Care Planning: ?Full code ? ?A discussion was had today regarding advanced  directives. Concepts specific to code status, artifical feeding and hydration, continued IV antibiotics and rehospitalization was had.  The difference between a aggressive medical intervention path and a palliative comfort care path for this patient at this time was had. She has elected full code, full scope of care. She is accepting of all offered treatments at this time. ? ?Decisions/Changes to ACP: ?None today ? ?Assessment & Plan:  ? ?Impression: ?56 year old female with multiple chronic illnesses now with acute presentation for infection.  SBP appears to have been ruled out, did test positive for Klebsiella UTI with leukocytosis and she is on treatment.  Hyponatremia noted related to her cirrhosis.  Chronic lower extremity edema and cellulitis/wounds of bilateral lower extremities as well as sacral wound.  She has been referred to [facility] transplant center and has an evaluation scheduled in July.  Her MELD score on admission was 23, MELD score today is 18.  She appears very frail and weak.  She will likely need significant strengthening to promote her  candidacy for transplant.  We discussed trajectory of illness and how this affects her quality of life.  Overall prognosis is guarded to poor. ? ?SUMMARY OF RECOMMENDATIONS   ?Continue full code, full scope ?She is exce

## 2022-05-03 NOTE — Evaluation (Signed)
Physical Therapy Evaluation ?Patient Details ?Name: Kristie Brooks [facility] ?MRN: [id number] ?DOB: [birth year] ?Today's Date: 05/03/2022 ? ?History of Present Illness ? Kristie Brooks is a 56 y.o. female with medical history significant of chronic respiratory failure on 2 LPM of oxygen, hypothyroidism, liver cirrhosis, bilateral lower extremity venous stasis dermatitis, right upper extremity DVT on Eliquis who presents to the emergency department due to generalized weakness.  Patient complained of being so weak she could barely get out of bed and that she has almost sitting nothing in the last 3 days.  She complained of lower extremity wound and pressure ulcer in the mid sacral area.  She was admitted from 1/10 to 1/12 due to right upper extremity DVT which was initially treated with IV heparin prior to transitioning to Eliquis on discharge.  She was also admitted from 12/15/2021 to 12/26/2021 due to severe sepsis secondary to cellulitis which was treated with IV vancomycin and was later transitioned to Bactrim DS on discharge. ?  ?Clinical Impression ? Patient demonstrates slow labored movement for sitting up at bedside requiring increased time and Mod assist, once seated required tactile assistance to flex knees before attempting sit to stands due to stiffness, able to stand with Mod assist and limited to a few slow labored side steps with mostly shuffling of feet due to weakness.  Patient tolerated sitting up in chair after therapy - nurse notified.  Patient will benefit from continued skilled physical therapy in hospital and recommended venue below to increase strength, balance, endurance for safe ADLs and gait.  ? ?   ?   ? ?Recommendations for follow up therapy are one component of a multi-disciplinary discharge planning process, led by the attending physician.  Recommendations may be updated based on patient status, additional functional criteria and insurance authorization. ? ?Follow Up Recommendations Skilled  nursing-short term rehab (<3 hours/day) ? ?  ?Assistance Recommended at Discharge Intermittent Supervision/Assistance  ?Patient can return home with the following ? A lot of help with bathing/dressing/bathroom;A lot of help with walking and/or transfers;Help with stairs or ramp for entrance;Assistance with cooking/housework ? ?  ?Equipment Recommendations None recommended by PT  ?Recommendations for Other Services ?    ?  ?Functional Status Assessment Patient has had a recent decline in their functional status and demonstrates the ability to make significant improvements in function in a reasonable and predictable amount of time.  ? ?  ?Precautions / Restrictions Precautions ?Precautions: Fall ?Restrictions ?Weight Bearing Restrictions: No  ? ?  ? ?Mobility ? Bed Mobility ?Overal bed mobility: Needs Assistance ?Bed Mobility: Sit to Supine ?  ?  ?  ?Sit to supine: Mod assist ?  ?General bed mobility comments: slow labored movement ?  ? ?Transfers ?Overall transfer level: Needs assistance ?Equipment used: Rolling walker (2 wheels) ?Transfers: Sit to/from Stand, Bed to chair/wheelchair/BSC ?Sit to Stand: Mod assist ?  ?Step pivot transfers: Mod assist ?  ?  ?  ?General transfer comment: unsteady labored movement with mostly shuffling of feet ?  ? ?Ambulation/Gait ?Ambulation/Gait assistance: Mod assist, Max assist ?Gait Distance (Feet): 4 Feet ?Assistive device: Rolling walker (2 wheels) ?Gait Pattern/deviations: Decreased step length - left, Decreased step length - right, Decreased stride length, Shuffle ?Gait velocity: slow ?  ?  ?General Gait Details: limited to a few slow labored shuffling side steps due to generalized weakness and poor standing balance ? ?Stairs ?  ?  ?  ?  ?  ? ?Wheelchair Mobility ?  ? ?Modified Rankin (Stroke Patients  Only) ?  ? ?  ? ?Balance Overall balance assessment: Needs assistance ?Sitting-balance support: Feet supported, No upper extremity supported ?Sitting balance-Leahy Scale:  Fair ?Sitting balance - Comments: fair/good seated at EOB ?  ?Standing balance support: During functional activity, Bilateral upper extremity supported ?Standing balance-Leahy Scale: Poor ?Standing balance comment: using RW ?  ?  ?  ?  ?  ?  ?  ?  ?  ?  ?  ?   ? ? ? ?Pertinent Vitals/Pain Pain Assessment ?Pain Assessment: Faces ?Faces Pain Scale: Hurts little more ?Pain Location: BLE with pressure, movement ?Pain Descriptors / Indicators: Sore, Discomfort, Grimacing ?Pain Intervention(s): Limited activity within patient's tolerance, Monitored during session, Repositioned  ? ? ?Home Living Family/patient expects to be discharged to:: Private residence ?Living Arrangements: Spouse/significant other ?Available Help at Discharge: Family;Available PRN/intermittently ?Type of Home: House ?Home Access: Level entry ?  ?  ?  ?Home Layout: One level ?Home Equipment: Conservation officer, nature (2 wheels);Wheelchair - manual;Shower seat;BSC/3in1;Grab bars - tub/shower ?   ?  ?Prior Function Prior Level of Function : Needs assist ?  ?  ?  ?Physical Assist : Mobility (physical);ADLs (physical) ?Mobility (physical): Bed mobility;Transfers;Gait;Stairs ?  ?Mobility Comments: household ambulator using RW, recently requiring assist to transfer, mostly bed bound ?ADLs Comments: assisted by family ?  ? ? ?Hand Dominance  ? Dominant Hand: Right ? ?  ?Extremity/Trunk Assessment  ? Upper Extremity Assessment ?Upper Extremity Assessment: Generalized weakness ?  ? ?Lower Extremity Assessment ?Lower Extremity Assessment: Generalized weakness ?  ? ?Cervical / Trunk Assessment ?Cervical / Trunk Assessment: Kyphotic  ?Communication  ? Communication: No difficulties  ?Cognition Arousal/Alertness: Awake/alert ?Behavior During Therapy: [facility] for tasks assessed/performed ?Overall Cognitive Status: Within Functional Limits for tasks assessed ?  ?  ?  ?  ?  ?  ?  ?  ?  ?  ?  ?  ?  ?  ?  ?  ?  ?  ?  ? ?  ?General Comments   ? ?  ?Exercises    ? ?Assessment/Plan  ?   ?PT Assessment Patient needs continued PT services  ?PT Problem List Decreased strength;Decreased activity tolerance;Decreased balance;Decreased mobility ? ?   ?  ?PT Treatment Interventions DME instruction;Gait training;Stair training;Functional mobility training;Therapeutic activities;Therapeutic exercise;Patient/family education;Balance training   ? ?PT Goals (Current goals can be found in the Care Plan section)  ?Acute Rehab PT Goals ?Patient Stated Goal: return home after rehab ?PT Goal Formulation: With patient ?Time For Goal Achievement: 05/17/22 ?Potential to Achieve Goals: Good ? ?  ?Frequency Min 3X/week ?  ? ? ?Co-evaluation   ?  ?  ?  ?  ? ? ?  ?AM-PAC PT "6 Clicks" Mobility  ?Outcome Measure Help needed turning from your back to your side while in a flat bed without using bedrails?: A Lot ?Help needed moving from lying on your back to sitting on the side of a flat bed without using bedrails?: A Lot ?Help needed moving to and from a bed to a chair (including a wheelchair)?: A Lot ?Help needed standing up from a chair using your arms (e.g., wheelchair or bedside chair)?: A Lot ?Help needed to walk in hospital room?: A Lot ?Help needed climbing 3-5 steps with a railing? : Total ?6 Click Score: 11 ? ?  ?End of Session   ?Activity Tolerance: Patient tolerated treatment well;Patient limited by fatigue ?Patient left: in chair;with call bell/phone within reach ?Nurse Communication: Mobility status ?PT Visit Diagnosis: Unsteadiness on  feet (R26.81);Other abnormalities of gait and mobility (R26.89);Muscle weakness (generalized) (M62.81) ?  ? ?Time: 2233-6122 ?PT Time Calculation (min) (ACUTE ONLY): 35 min ? ? ?Charges:   PT Evaluation ?$PT Eval Moderate Complexity: 1 Mod ?PT Treatments ?$Therapeutic Activity: 23-37 mins ?  ?   ? ? ?11:34 AM, 05/03/22 ?Lonell Grandchild, MPT ?Physical Therapist with [place] ?[facility] ?[phone] office ?[phone] mobile phone ? ? ?

## 2022-05-03 NOTE — Progress Notes (Signed)
PT tolerated right sided paracentesis procedure well today and 2.8 Liters of clear yellow fluid removed with labs collected and sent for processing. PT verbalized understanding of post procedure instructions and returned to inpatient bed assignment at this time via stretcher with no acute distress noted.  ?

## 2022-05-03 NOTE — TOC Initial Note (Signed)
Transition of Care (TOC) - Initial/Assessment Note  ? ? ?Patient Details  ?Name: Kristie Brooks [facility] ?MRN: [id number] ?Date of Birth: August 04, 1966 ? ?Transition of Care (TOC) CM/SW Contact:    ?Boneta Lucks, RN ?Phone Number: ?05/03/2022, 11:39 AM ? ?Clinical Narrative:    Patient admitted with open wound of left lower extremity. Patient has a high risk for readmission, lives at home with her husband. Mother assist as needed.  PT is recommending SNF. Patient discharged from  [facility]. Patient states she need to talk with her husband about her discharge plan. She is agreeable for TOC to send out for bed offers to discuss. TOC sent out FL2.         ? ?  ?Barriers to Discharge: Continued Medical Work up, Other (must enter comment) (Palliative and Spirtual consulted) ? ?   ?Activities of Daily Living ?Home Assistive Devices/Equipment: Hospital bed, Walker (specify type), Bedside commode/3-in-1 ?ADL Screening (condition at time of admission) ?Patient's cognitive ability adequate to safely complete daily activities?: Yes ?Is the patient deaf or have difficulty hearing?: No ?Does the patient have difficulty seeing, even when wearing glasses/contacts?: No ?Does the patient have difficulty concentrating, remembering, or making decisions?: Yes ?Patient able to express need for assistance with ADLs?: Yes ?Does the patient have difficulty dressing or bathing?: Yes ?Independently performs ADLs?: Yes (appropriate for developmental age) ?Does the patient have difficulty walking or climbing stairs?: Yes ?Weakness of Legs: Both ?Weakness of Arms/Hands: Both ? ?   ? ?Admission diagnosis:  Severe sepsis ([place]) [A41.9, R65.20] ?Open wound of left lower extremity [S81.802A] ?Patient Active Problem List  ? Diagnosis Date Noted  ? UTI due to Klebsiella species 05/01/2022  ? Open wound of left lower extremity 04/30/2022  ? Leukocytosis 04/30/2022  ? Hyperammonemia ([place]) 04/30/2022  ? Hyperkalemia 04/30/2022  ? Lactic  acidosis 04/30/2022  ? Obesity (BMI 30-39.9) 04/30/2022  ? Hypothyroidism 01/05/2022  ? GERD (gastroesophageal reflux disease) 01/05/2022  ? DVT (deep venous thrombosis) ([place]) 01/04/2022  ? Sepsis due to undetermined organism ([place]) 12/22/2021  ? Cellulitis, leg 12/22/2021  ? Hematemesis   ? Cirrhosis of liver with ascites ([place])   ? Ascites   ? Anasarca   ? Pressure injury of skin 12/18/2021  ? Sepsis ([place]) 12/16/2021  ? ?PCP:  Keane Police, MD ?Pharmacy:   ?[id number] [id number]?Phone: [phone]: [phone]? ? ?Readmission Risk Interventions ? ?  05/03/2022  ? 11:37 AM 12/24/2021  ?  1:25 PM 12/17/2021  ?  2:02 PM  ?Readmission Risk Prevention Plan  ?Post Dischage Appt  Complete   ?Medication Screening  Complete Complete  ?Transportation Screening Complete Complete Complete  ?[facility] Consult Complete    ?Social Work Consult for [place]anning/Counseling Complete    ?Palliative Care Screening Not Complete    ?Palliative Care Screening Not Complete Comments waiting on consult    ?Medication Review (Press photographer Complete    ? ? ? ?

## 2022-05-04 ENCOUNTER — Encounter (HOSPITAL_COMMUNITY): Payer: Self-pay | Admitting: Internal Medicine

## 2022-05-04 DIAGNOSIS — D696 Thrombocytopenia, unspecified: Secondary | ICD-10-CM

## 2022-05-04 DIAGNOSIS — R6520 Severe sepsis without septic shock: Secondary | ICD-10-CM | POA: Diagnosis not present

## 2022-05-04 DIAGNOSIS — G894 Chronic pain syndrome: Secondary | ICD-10-CM | POA: Diagnosis not present

## 2022-05-04 DIAGNOSIS — I9589 Other hypotension: Secondary | ICD-10-CM | POA: Diagnosis not present

## 2022-05-04 DIAGNOSIS — D638 Anemia in other chronic diseases classified elsewhere: Secondary | ICD-10-CM | POA: Diagnosis not present

## 2022-05-04 DIAGNOSIS — K2100 Gastro-esophageal reflux disease with esophagitis, without bleeding: Secondary | ICD-10-CM | POA: Diagnosis not present

## 2022-05-04 DIAGNOSIS — A491 Streptococcal infection, unspecified site: Secondary | ICD-10-CM

## 2022-05-04 DIAGNOSIS — A419 Sepsis, unspecified organism: Secondary | ICD-10-CM | POA: Diagnosis not present

## 2022-05-04 DIAGNOSIS — R7881 Bacteremia: Secondary | ICD-10-CM | POA: Diagnosis not present

## 2022-05-04 DIAGNOSIS — S81802A Unspecified open wound, left lower leg, initial encounter: Secondary | ICD-10-CM | POA: Diagnosis not present

## 2022-05-04 DIAGNOSIS — K7460 Unspecified cirrhosis of liver: Secondary | ICD-10-CM | POA: Diagnosis not present

## 2022-05-04 LAB — PATHOLOGIST SMEAR REVIEW

## 2022-05-04 LAB — IRON AND TIBC
Iron: 20 ug/dL — ABNORMAL LOW (ref 28–170)
Saturation Ratios: 9 % — ABNORMAL LOW (ref 10.4–31.8)
TIBC: 218 ug/dL — ABNORMAL LOW (ref 250–450)
UIBC: 198 ug/dL

## 2022-05-04 LAB — FERRITIN: Ferritin: 132 ng/mL (ref 11–307)

## 2022-05-04 LAB — CULTURE, BLOOD (ROUTINE X 2)

## 2022-05-04 MED ORDER — MAGNESIUM SULFATE 2 GM/50ML IV SOLN
2.0000 g | Freq: Once | INTRAVENOUS | Status: AC
Start: 2022-05-04 — End: 2022-05-04
  Administered 2022-05-04: 2 g via INTRAVENOUS
  Filled 2022-05-04: qty 50

## 2022-05-04 MED ORDER — PROCHLORPERAZINE EDISYLATE 10 MG/2ML IJ SOLN
10.0000 mg | INTRAMUSCULAR | Status: DC | PRN
  Administered 2022-05-04: 10 mg via INTRAVENOUS
  Filled 2022-05-04: qty 2

## 2022-05-04 MED ORDER — PENICILLIN G POTASSIUM 20000000 UNITS IJ SOLR
12.0000 10*6.[IU] | Freq: Two times a day (BID) | INTRAVENOUS | Status: DC
  Administered 2022-05-04 – 2022-05-06 (×4): 12 10*6.[IU] via INTRAVENOUS
  Filled 2022-05-04 (×6): qty 12

## 2022-05-04 NOTE — Consult Note (Signed)
?Referring Provider: No ref. provider found ?Primary Care Physician:  Keane Police, MD ?Primary Gastroenterologist:  Dr. Comer Locket, [place] ? ?Date of Admission:  ?Date of Consultation: 05/04/22 ? ?Reason for Consultation:  streptococcus gallolyticus, colonoscopy ? ?HPI:  ?Kristie Brooks is a 56 y.o. year old female 56 year old female with numerous multi morbidities to include history of advanced cirrhosis felt likely secondary to Hep C (genotype 1a, previously eradicated with Harvoni in 2015), chronic respiratory failure on 2L supplental O2, hypothyroidism, GERD, Bilateral LE Venous stasis dermatitis, RUE DVT on Eliquis who was admitted on 04/30/22 with electrolyte abnormalities and concern for cellulitis/infected wounds in setting of liver cirrhosis with large volume ascites, found to have positive blood culture revealing streptococcus Gallolyticus, GI consulted as ID recommends further evaluation of organism via colonoscopy.  ? ?Notably patient with lactic acid of 2.6 on admission, Klebsiella UTI being treated with IV rocephin, Chronic hypotension, maintained on Midodrine, hyponatremia in setting of liver cirrhosis, Leukocytosis of 19.6 secondary to previously outlined infections, chronic anemia and thrombocytopenia with hgb 10.8 on admission and platelet count 184k.  ? ?Consult: ?Patient reports that she has had some upset stomach since her paracentesis, c/o belly pain that began after that, reports nausea with one episode of vomiting today. Denies diarrhea, rectal bleeding or black stools. Endorses more fatigue today, denies shortness of breath or dizziness. Reports she has 1 BM per day usually. Denies any episodes of confusion on outpatient basis or since admission. Denies changes in bowel habits or stools. No weight loss, endorses she is eating less recently than she previously had been. She denies any previous colonoscopy. Denies any known family history of colon cancer. Reports last paracentesis was  around January or February, though she did not feel that ascites was much worse recently than it had been. Endorses ongoing LEE.  ? ? ?Last Colonoscopy: Never ?Last EGD: 12/24/21 - Normal hypopharynx. ?- Normal proximal esophagus and mid esophagus. ?- Scar in the distal esophagus. ?- LA Grade A esophagitis with no bleeding. ?- Z-line. ?- Z-line, 35 cm from the incisors. ?- 2 cm hiatal hernia. ?- Portal hypertensive gastropathy. ?- Normal antrum, prepyloric region of the stomach and pylorus. ?- Congested duodenal mucosa. ?- No specimens collected. ? ?Past Medical History:  ?Diagnosis Date  ? Arthritis   ? Basal cell carcinoma 05/18/2021  ? nod-mid forehead ([facility])  ? GERD (gastroesophageal reflux disease)   ? Hyperthyroidism   ? Peripheral vascular disease ([place])   ? Pneumonia   ? ? ?Past Surgical History:  ?Procedure Laterality Date  ? ESOPHAGOGASTRODUODENOSCOPY (EGD) WITH PROPOFOL N/A 12/24/2021  ? Procedure: ESOPHAGOGASTRODUODENOSCOPY (EGD) WITH PROPOFOL;  Surgeon: Rogene Houston, MD;  Location: [facility];  Service: Endoscopy;  Laterality: N/A;  ? TONSILLECTOMY    ? ? ?Prior to Admission medications   ?Medication Sig Start Date End Date Taking? Authorizing Provider  ?acetaminophen (TYLENOL) 325 MG tablet Take 650 mg by mouth every 6 (six) hours as needed for moderate pain.   Yes [provider]  ?apixaban (ELIQUIS) 5 MG TABS tablet Take 2 tablets (10 mg total) by mouth 2 (two) times daily. Then 1 tab (5 mg) two times daily starting 01/13/22 ?Patient taking differently: Take 5 mg by mouth 2 (two) times daily. Then 1 tab (5 mg) two times daily starting 01/13/22 01/06/22  Yes Tat, Shanon Brow, MD  ?Ascorbic Acid (VITAMIN C) 1000 MG tablet Take 1,000 mg by mouth daily.   Yes [provider]  ?calcium carbonate (TUMS - DOSED IN  MG ELEMENTAL CALCIUM) 500 MG chewable tablet Chew 1 tablet by mouth daily.   Yes [provider]  ?fexofenadine (ALLEGRA) 180 MG tablet Take by mouth.   Yes [provider]  ?fish oil-omega-3 fatty acids 1000 MG capsule Take 1 g by mouth daily.   Yes [provider]  ?folic acid (FOLVITE) 1 MG tablet Take 1 mg by mouth daily.   Yes [provider]  ?levothyroxine (SYNTHROID) 50 MCG tablet Take 1 tablet (50 mcg total) by mouth daily before breakfast. 12/24/21  Yes Tat, David, MD  ?magnesium oxide (MAG-OX) 400 MG tablet Take 400 mg by mouth daily.   Yes [provider]  ?midodrine (PROAMATINE) 10 MG tablet Take 1 tablet (10 mg total) by mouth 3 (three) times daily with meals. 12/24/21  Yes Tat, Shanon Brow, MD  ?ondansetron (ZOFRAN) 4 MG tablet Take 4 mg by mouth every 8 (eight) hours as needed for nausea. 04/14/22  Yes [provider]  ?oxyCODONE (OXY IR/ROXICODONE) 5 MG immediate release tablet Take 1 tablet (5 mg total) by mouth every 4 (four) hours as needed for moderate pain. 01/06/22  Yes Tat, Shanon Brow, MD  ?pantoprazole (PROTONIX) 40 MG tablet Take 1 tablet (40 mg total) by mouth 2 (two) times daily before a meal. 12/24/21  Yes Tat, David, MD  ?potassium chloride SA (KLOR-CON M) 20 MEQ tablet Take 1 tablet (20 mEq total) by mouth daily. 01/07/22  Yes Tat, Shanon Brow, MD  ?thiamine 250 MG tablet Take 250 mg by mouth daily.   Yes [provider]  ?torsemide (DEMADEX) 20 MG tablet Take 20 mg by mouth daily.   Yes [provider]  ?vitamin B-12 (CYANOCOBALAMIN) 250 MCG tablet Take 250 mcg by mouth daily.   Yes [provider]  ?Vitamin D, Ergocalciferol, (DRISDOL) 1.25 MG (50000 UNIT) CAPS capsule Take 50,000 Units by mouth every 7 (seven) days. Sundays   Yes [provider]  ?vitamin E 200 UNIT capsule Take 400 Units by mouth daily.   Yes [provider]  ?lactulose (CHRONULAC) 10 GM/15ML solution Take 15 mLs (10 g total) by mouth daily. ?Patient not taking: Reported on 04/29/2022 01/06/22   Orson Eva, MD  ? ? ?Current Facility-Administered Medications  ?Medication Dose Route Frequency Provider Last Rate  Last Admin  ? acetaminophen (TYLENOL) tablet 650 mg  650 mg Oral Q6H PRN Bernadette Hoit, DO      ? Or  ? acetaminophen (TYLENOL) suppository 650 mg  650 mg Rectal Q6H PRN Adefeso, Oladapo, DO      ? apixaban (ELIQUIS) tablet 5 mg  5 mg Oral BID Adefeso, Oladapo, DO   5 mg at 05/04/22 0905  ? cefTRIAXone (ROCEPHIN) 2 g in sodium chloride 0.9 % 100 mL IVPB  2 g Intravenous Q24H Emokpae, Courage, MD 200 mL/hr at 05/03/22 1749 2 g at 05/03/22 1749  ? DULoxetine (CYMBALTA) DR capsule 30 mg  30 mg Oral Daily Denton Brick, Courage, MD   30 mg at 05/04/22 0905  ? feeding supplement (ENSURE ENLIVE / ENSURE PLUS) liquid 237 mL  237 mL Oral BID BM Adefeso, Oladapo, DO      ? folic acid (FOLVITE) tablet 1 mg  1 mg Oral Daily Adefeso, Oladapo, DO   1 mg at 05/04/22 0905  ? Gerhardt's butt cream   Topical TID Roxan Hockey, MD   Given at 05/04/22 [phone]  ? lactulose (CHRONULAC) 10 GM/15ML solution 20 g  20 g Oral TID Adefeso, Oladapo, DO   20 g  at 05/04/22 0906  ? levothyroxine (SYNTHROID) tablet 50 mcg  50 mcg Oral Q0600 Adefeso, Oladapo, DO   50 mcg at 05/04/22 0532  ? midodrine (PROAMATINE) tablet 10 mg  10 mg Oral TID WC Adefeso, Oladapo, DO   10 mg at 05/04/22 0762  ? nystatin (MYCOSTATIN/NYSTOP) topical powder   Topical BID Roxan Hockey, MD   Given at 05/04/22 [phone]  ? ondansetron (ZOFRAN) injection 4 mg  4 mg Intravenous Q6H PRN Adefeso, Oladapo, DO   4 mg at 05/04/22 1039  ? oxyCODONE (Oxy IR/ROXICODONE) immediate release tablet 5 mg  5 mg Oral Q8H PRN Roxan Hockey, MD   5 mg at 05/03/22 1243  ? pantoprazole (PROTONIX) EC tablet 40 mg  40 mg Oral BID AC Adefeso, Oladapo, DO   40 mg at 05/04/22 0905  ? vitamin B-12 (CYANOCOBALAMIN) tablet 250 mcg  250 mcg Oral Daily Adefeso, Oladapo, DO   250 mcg at 05/04/22 3545  ? ? ?Allergies as of 04/29/2022 - Review Complete 04/29/2022  ?Allergen Reaction Noted  ? Dakin's [sodium hypochlorite] Dermatitis 12/16/2021  ? Zosyn [piperacillin sod-tazobactam so] Other (See Comments)  12/15/2021  ? ? ?Family History  ?Problem Relation Age of Onset  ? Breast cancer Mother   ? Hypertension Mother   ? Gastric cancer Father   ? Hypertension Brother   ? Alcohol abuse Brother   ? Pancreatitis [place]

## 2022-05-04 NOTE — Assessment & Plan Note (Addendum)
--   continue lactulose as ordered (Pt has been reluctant to take) ?-- no BM reported, GI increased lactulose and will check ammonia in AM ?

## 2022-05-04 NOTE — Assessment & Plan Note (Signed)
stable °

## 2022-05-04 NOTE — TOC Progression Note (Addendum)
Transition of Care (TOC) - Progression Note  ? ? ?Patient Details  ?Name: Kristie Brooks [facility] ?MRN: [id number] ?Date of Birth: 21-Jul-1966 ? ?Transition of Care (TOC) CM/SW Contact  ?Salome Arnt, LCSW ?Phone Number: ?05/04/2022, 8:35 AM ? ?Clinical Narrative:  LCSW presented bed offers and pt accepts [facility]. Facility notified and will start authorization this morning.   ? ?TOC received consult for outpatient palliative follow up with [facility] and Palliative Care. Referral made. ? ?  ?Barriers to Discharge: Continued Medical Work up, Other (must enter comment) (Palliative and Spirtual consulted) ? ?Expected Discharge Plan and Services ?  ?  ?  ?  ?  ?                ?  ?  ?  ?  ?  ?  ?  ?  ?  ?  ? ? ?Social Determinants of Health (SDOH) Interventions ?  ? ?Readmission Risk Interventions ? ?  05/03/2022  ? 11:37 AM 12/24/2021  ?  1:25 PM 12/17/2021  ?  2:02 PM  ?Readmission Risk Prevention Plan  ?Post Dischage Appt  Complete   ?Medication Screening  Complete Complete  ?Transportation Screening Complete Complete Complete  ?[place] or Home Care Consult Complete    ?Social Work Consult for [place] Planning/Counseling Complete    ?Palliative Care Screening Not Complete    ?Palliative Care Screening Not Complete Comments waiting on consult    ?Medication Review Press photographer) Complete    ? ? ?

## 2022-05-04 NOTE — Progress Notes (Signed)
Patient vomited  up food substance and medications, PRN zofran given when time,., Dr. Wynetta Emery aware ? ?

## 2022-05-04 NOTE — Assessment & Plan Note (Addendum)
--   continue wound care treatment recommendations  ?-- focus on comfort care now ?

## 2022-05-04 NOTE — Assessment & Plan Note (Addendum)
--   Treated with ceftriaxone IV  ?

## 2022-05-04 NOTE — Assessment & Plan Note (Addendum)
--   appreciate ID consultation, Pt now on IV penicillin ?-- this bug associated with colon CA ?-- GI consultation requested in case endoscopy is warranted  ?-- TTE no evidence of vegetations ?-- unfortunately patient has rapidly decompensated and appears terminally ill, after discussion with family decision made to pursue full comfort measures.   ?

## 2022-05-04 NOTE — Assessment & Plan Note (Addendum)
--   s/p paracentesis 2.3L fluid removed  ?-- no signs of SBP found ?-- resumed cirrhosis medications ?-- pt is very ill, appreciate palliative consultation ?-- pt has rapidly decompensated and now terminally ill, transitioning to full comfort measures ?-- awaiting for a residential hospice bed ?

## 2022-05-04 NOTE — Consult Note (Signed)
?  [facility] for Infectious Disease  ? ? ?Date of Admission:  04/29/2022    ? ?Reason for Consult: Strep Bacteremia ?    ?Referring Physician: Dr Wynetta Emery ? ?Current antibiotics: ?Ceftriaxone 5/5 -- Present ? ?Previous antibiotics: ?Vancomycin 5/5-5/7 ? ? ?ASSESSMENT:   ? ?56 y.o. female admitted with: ? ?Strep gallolyticus bacteremia: patient admitted with severe sepsis and admit blood cx positive for this organism with repeat blood cx NGTD currently on ceftriaxone.   ?Decompensated liver cirrhosis: she has hx of HCV s/p eradication in 2015.  She apparently had stage 2 fibrosis based on liver biopsy at time of her HCV treatment so unclear if this was understaged or if she has another non-viral etiology of her cirrhosis.  She follows with GI outpatient.  Her last EGD was 11/2021 and did not show varices although CT abd/pelvis was suggestive of this complication. ?Ascites: S/p paracentesis 5/9 with no evidence of SBP.  ?Kleb pna in urine culture: Her UA is unremarkable and she does not have a UTI. ?Chronic venous stasis dermatitis/lymphedema and lower extremity wounds: Evaluated by [facility] and recommendations in place. Difficult to control given her cirrhosis and 3rd spacing. ? ? ?RECOMMENDATIONS:   ? ? ?Continue antibiotics and will narrow to penicillin based on susceptibilities ?Echocardiogram ?She is at risk of Strep gallolyticus bacteremia in setting of her underlying liver disease but would also recommend colonoscopy to evaluate for colonic neoplasia given the strong epidemiologic association between Strep gallolyticus and colon cancer ?Follow up repeat cultures ?Wound care ?Will follow ? ? ?Principal Problem: ?  Open wound of left lower extremity ?Active Problems: ?  Cirrhosis of liver with ascites ([place]) ?  DVT (deep venous thrombosis) ([place]) ?  Hypothyroidism ?  GERD (gastroesophageal reflux disease) ?  Leukocytosis ?  Hyperammonemia ([place]) ?  Hyperkalemia ?  Lactic acidosis ?  Obesity (BMI 30-39.9) ?  UTI due  to Klebsiella species ?  Anemia, chronic disease ?  Streptococcal gallolyticus bacteremia ?  Chronic pain syndrome ?  Chronic hypotension ?  Infection due to Streptococcus gallolyticus ?  Thrombocytopenia ([place]) ? ? ?MEDICATIONS:   ? ?Scheduled Meds: ?? apixaban  5 mg Oral BID  ?? DULoxetine  30 mg Oral Daily  ?? feeding supplement  237 mL Oral BID BM  ?? folic acid  1 mg Oral Daily  ?? Gerhardt's butt cream   Topical TID  ?? lactulose  20 g Oral TID  ?? levothyroxine  50 mcg Oral Q0600  ?? midodrine  10 mg Oral TID WC  ?? nystatin   Topical BID  ?? pantoprazole  40 mg Oral BID AC  ?? vitamin B-12  250 mcg Oral Daily  ? ?Continuous Infusions: ?? cefTRIAXone (ROCEPHIN)  IV 2 g (05/03/22 1749)  ? ?PRN Meds:.acetaminophen **OR** acetaminophen, ondansetron (ZOFRAN) IV, oxyCODONE, prochlorperazine ? ?HPI:   ? ?Kristie Brooks is a 56 y.o. female with complicated past medical hx including chronic respiratory failure, on 2 L via [place], hypothyroidism, liver cirrhosis, bilateral venous stasis dermatitis, right upper extremity DVT following prior PICC line on Eliquis whom was admitted to [facility] on 5/5 with electrolyte abnormality, generalized weakness, and concerns about cellulitis with infected wounds.  She was also noted to have large volume ascites in the setting of advanced liver disease. ? ?Work up in the ED showed soft BP, tachypnea, and otherwise normal vitals.  She had leukocytosis and lactic acidosis.  She underwent CT abd/pelvis with contrast notable for liver cirrhosis and signs of  portal hypertension including splenomegaly, varices, and ascites.  She was started on IV ceftriaxone and vancomycin.  She has been afebrile and her WBC curve has improved with antibiotics.  She was seen by [place] who provided recommendations on her chronic wounds. ? ?Infectious work up including cultures was obtained.  UA was not indicative of any type of UTI.  Urine Cx = Kleb pna with only 2k colonies.  Blood cx positive for Strep  gallolyticus in 2 of 3 cultures from 5/5.  Repeat cx 5/7 are NGTD.  She also underwent paracentesis on 5/9 with removal of 2.8 Liters.  This was not consistent with SBP based on cell counts. Gram stain and cultures also negative.   We have been consulted in the setting of bacteremia.  ? ? ? ? ? ?OBJECTIVE:  ? ?Blood pressure 107/62, pulse 82, temperature 97.6 ?F (36.4 ?C), resp. rate 18, height '5\' 5"'$  (1.651 m), weight 104 kg, SpO2 94 %. ?Body mass index is 38.15 kg/m?. ? ?Physical Exam ?Virtual consult.  Images of leg wounds reviewed.  ? ?Lab Results: ?Lab Results  ?Component Value Date  ? WBC 8.6 05/03/2022  ? HGB 9.0 (L) 05/03/2022  ? HCT 27.6 (L) 05/03/2022  ? MCV 96.5 05/03/2022  ? PLT 142 (L) 05/03/2022  ?  ?Lab Results  ?Component Value Date  ? NA 130 (L) 05/03/2022  ? K 4.1 05/03/2022  ? CO2 14 (L) 05/03/2022  ? GLUCOSE 74 05/03/2022  ? BUN 30 (H) 05/03/2022  ? CREATININE 0.98 05/03/2022  ? CALCIUM 7.9 (L) 05/03/2022  ? GFRNONAA >60 05/03/2022  ?  ?Lab Results  ?Component Value Date  ? ALT 23 05/03/2022  ? AST 33 05/03/2022  ? ALKPHOS 165 (H) 05/03/2022  ? BILITOT 0.9 05/03/2022  ? ? ?No results found for: CRP ? ?No results found for: ESRSEDRATE ? ?I have reviewed the micro and lab results in Epic. ? ?Imaging: ?US Paracentesis ? ?Result Date: 05/03/2022 ?Lavonia Dana, MD     05/03/2022 12:53 PM PreOperative Dx: Cirrhosis, ascites Postoperative Dx: Cirrhosis, ascites Procedure:   US guided paracentesis Radiologist:  Thornton Papas Anesthesia:  10 ml of1% lidocaine Specimen:  2.8 L of yellow ascitic fluid EBL:   < 1 ml Complications: None   ? ?[place] EKG SITE RITE ? ?Result Date: 05/02/2022 ?If [employer] not attached, placement could not be confirmed due to current cardiac rhythm.   ? ?Imaging independently reviewed in Epic. ? ?Mignon Pine ?[facility] for Infectious Disease ?[place] ?[phone] pager ?05/04/2022, 1:25 PM ? ?I spent > 45 min reviewing chart, formulating plan, and discussing with  [facility] ? ?

## 2022-05-04 NOTE — Assessment & Plan Note (Addendum)
--   WBC markedly elevated now up to 39 ?-- pt not responding favorably to treatments, after discussions with family decision made to transition to full comfort measures ?

## 2022-05-04 NOTE — Assessment & Plan Note (Signed)
--   treated and resolved ?

## 2022-05-04 NOTE — Assessment & Plan Note (Signed)
--   continue daily levothyroxine therapy  ?

## 2022-05-04 NOTE — Assessment & Plan Note (Signed)
--   treated and resolved ?-- sepsis ruled out  ?

## 2022-05-04 NOTE — Assessment & Plan Note (Addendum)
stable °

## 2022-05-04 NOTE — Hospital Course (Addendum)
56 y.o. female with medical history significant of chronic respiratory failure on 2 LPM of oxygen, hypothyroidism, liver cirrhosis, bilateral lower extremity venous stasis dermatitis, right upper extremity DVT on Eliquis admitted on 04/30/2022 with electrolyte abnormalities, transaminitis and concerns about cellulitis/infected wounds in the setting of liver cirrhosis with large volume ascites.   ? ?05/04/2022: 2/4 blood culture bottles positive for strep gallolyticus, ID consult requested, GI consult requested.  ? ?05/05/2022: bump in WBC, more somnolent.  ? ?05/06/2022: rapidly decompensating, WBC rising even more today, discussed with ID and GI prognosis very grave at this point and not thought to have a chance for meaningful recovery given she is too decompensated for liver transplant and rapidly progressive decline.  I had a conference with patient's mother at bedside and she expressed understanding of the rapid decline and requested for [place] to pursue full comfort care measures and stop lab draws, tests, etc.  Pt is residential hospice appropriate and I anticipate prognosis of less than 2 weeks.   ? ?05/07/2022:  I met with husband at bedside this morning.  He requested placement at [facility]. I informed him that they currently have no beds available but we will start the referral.  We will focus on comfort measures while patient waiting for residential hospice bed.  Pt says he thinks she requires too much care for him to manage alone at home.   ? ?05/08/2022: Pt remains on full comfort care. Awaiting for residential hospice bed.   ?05-28-22: palliative team following, focus remains full comfort and dignity.  ?

## 2022-05-04 NOTE — Assessment & Plan Note (Addendum)
--   transitioning to full comfort measures  ?

## 2022-05-04 NOTE — Assessment & Plan Note (Addendum)
--  full comfort measures  ?

## 2022-05-04 NOTE — Progress Notes (Signed)
?PROGRESS NOTE ? ? ?Kristie Brooks  PYP:[id number] DOB: [birth year] DOA: 04/29/2022 ?PCP: Keane Police, MD  ? ?Chief Complaint  ?Patient presents with  ? Weakness  ? ?Level of care: Telemetry ? ?Brief Admission History:  ?56 y.o. female with medical history significant of chronic respiratory failure on 2 LPM of oxygen, hypothyroidism, liver cirrhosis, bilateral lower extremity venous stasis dermatitis, right upper extremity DVT on Eliquis admitted on 04/30/2022 with electrolyte abnormalities, transaminitis and concerns about cellulitis/infected wounds in the setting of liver cirrhosis with large volume ascites.   ? ?05/04/2022: 2/4 blood culture bottles positive for strep gallolyticus, ID consult requested, GI consult requested.  ? ?  ?Assessment and Plan: ?* Open wound of left lower extremity ?-- continue wound care treatment recommendations  ? ?Chronic hypotension ?--stable, resumed home midodrine for BP support  ? ?Chronic pain syndrome ?-- resumed home cymbalta and oxycodone ? ?Streptococcal gallolyticus bacteremia ?-- appreciate ID consultation ?-- this bug associated with colon CA ?-- GI consultation requested in case endoscopy is warranted  ? ?Anemia, chronic disease ?-- Hg stable, follow ? ?UTI due to Klebsiella species ?-- Pt being treated with ceftriaxone IV  ? ?Obesity (BMI 30-39.9) ?-- stable  ? ?Lactic acidosis ?-- treated and resolved ?-- sepsis ruled out  ? ?Hyperkalemia ?-- treated and resolved ? ?Hyperammonemia (HCC) ?-- continue lactulose as ordered (Pt has been reluctant to take) ? ?Leukocytosis ?-- WBC trending down ? ?GERD (gastroesophageal reflux disease) ?-- stable continue PPI for GI protection ? ?Hypothyroidism ?-- continue daily levothyroxine therapy  ? ?Cirrhosis of liver with ascites (HCC) ?-- s/p paracentesis 2.3L fluid removed  ?-- no signs of SBP found ?-- resumed cirrhosis medications ? ? ?DVT prophylaxis: SCDs ?Code Status: Full  ?Family Communication:  ?Disposition: Status is:  Inpatient ?Remains inpatient appropriate because: IV antibiotics recommended ?  ?Consultants:  ?ID ?GI ?Procedures:  ?Paracentesis  ?Antimicrobials:  ?Ceftriaxone   ?Subjective: ?Awake, alert, feels tired and fatigued, poor appetite.  No abdominal pain.   ?Objective: ?Vitals:  ? 05/03/22 1210 05/03/22 1313 05/03/22 2108 05/04/22 0554  ?BP: (!) 106/59 110/64 (!) 99/58 101/61  ?Pulse: 80 82 76 82  ?Resp: '18 20 20 20  '$ ?Temp:  (!) 97.5 ?F (36.4 ?C) 97.6 ?F (36.4 ?C) 97.7 ?F (36.5 ?C)  ?TempSrc:  Oral Oral Oral  ?SpO2: 99% 100% 98% 96%  ?Weight:      ?Height:      ? ? ?Intake/Output Summary (Last 24 hours) at 05/04/2022 1226 ?Last data filed at 05/04/2022 0900 ?Gross per 24 hour  ?Intake 790 ml  ?Output 300 ml  ?Net 490 ml  ? ?Filed Weights  ? 04/29/22 1710  ?Weight: 104 kg  ? ?Examination: ? ?General exam: chronically ill appearing, Appears calm and comfortable  ?Respiratory system: Clear to auscultation. Respiratory effort normal. ?Cardiovascular system: normal S1 & S2 heard. No JVD, murmurs, rubs, gallops or clicks. No pedal edema. ?Gastrointestinal system: Abdomen is nondistended, soft and nontender. No organomegaly or masses felt. Normal bowel sounds heard. ?Central nervous system: Alert and oriented. No focal neurological deficits. ?Extremities: Symmetric 5 x 5 power. ?Skin: left leg wounds, sacral wounds. ?Psychiatry: Judgement and insight appear normal. Mood & affect appropriate.  ? ? ?Data Reviewed: I have personally reviewed following labs and imaging studies ? ?CBC: ?Recent Labs  ?Lab 04/29/22 ?1529 04/30/22 ?2458 05/03/22 ?0998  ?WBC 22.1* 19.6* 8.6  ?NEUTROABS 19.8*  --   --   ?HGB 10.8* 8.9* 9.0*  ?HCT 33.5* 27.0* 27.6*  ?MCV 98.0  96.1 96.5  ?PLT 184 125* 142*  ? ? ?Basic Metabolic Panel: ?Recent Labs  ?Lab 04/29/22 ?1529 04/29/22 ?1535 04/30/22 ?7371 04/30/22 ?1131 05/03/22 ?0626  ?NA 129*  --  126* 128* 130*  ?K 5.4*  --  4.7 4.8 4.1  ?CL 106  --  106 105 109  ?CO2 15*  --  15* 15* 14*  ?GLUCOSE 98  --  89  94 74  ?BUN 37*  --  33* 34* 30*  ?CREATININE 1.09*  --  0.93 1.04* 0.98  ?CALCIUM 7.8*  --  7.1* 7.6* 7.9*  ?MG  --  2.3 2.1  --   --   ?PHOS  --   --  3.8  --   --   ? ? ?CBG: ?No results for input(s): GLUCAP in the last 168 hours. ? ?Recent Results (from the past 240 hour(s))  ?Blood Culture (routine x 2)     Status: Abnormal  ? Collection Time: 04/29/22  3:29 PM  ? Specimen: BLOOD  ?Result Value Ref Range Status  ? Specimen Description   Final  ?  BLOOD BLOOD LEFT FOREARM ?Performed at [facility], [address] [place], [place] [address] ?  ? Special Requests   Final  ?  BOTTLES DRAWN AEROBIC AND ANAEROBIC Blood Culture adequate volume ?Performed at [facility], [address] [place], [place] [address] ?  ? Culture  Setup Time   Final  ?  GRAM POSITIVE COCCI ?Gram Stain Report Called to,Read Back By and Verified With: TO KFREEMAN,RN '@1905'$  ON 04/30/22 BY GMCGEEHON. ?ANAEROBIC BOTTLE ONLY ?GS DONE AT [facility] ?CRITICAL RESULT CALLED TO, READ BACK BY AND VERIFIED WITH: ?RN ZAC PANTHER 04/30/22'@23'$ :13 BY TW ?  ? Culture (A)  Final  ?  STREPTOCOCCUS GALLOLYTICUS ?THE SIGNIFICANCE OF ISOLATING THIS ORGANISM FROM A SINGLE SET OF BLOOD CULTURES WHEN MULTIPLE SETS ARE DRAWN IS UNCERTAIN. PLEASE NOTIFY THE MICROBIOLOGY DEPARTMENT WITHIN ONE WEEK IF SPECIATION AND SENSITIVITIES ARE REQUIRED. ?Performed at [facility], [place] [address] [place], [place] [address] ?  ? Report Status 05/02/2022 FINAL  Final  ?Urine Culture     Status: Abnormal  ? Collection Time: 04/29/22  3:29 PM  ? Specimen: In/Out Cath Urine  ?Result Value Ref Range Status  ? Specimen Description   Final  ?  IN/OUT CATH URINE ?Performed at [facility], [address] [place], [place] [address] ?  ? Special Requests   Final  ?  NONE ?Performed at [facility], [address], [place], [place] [address] ?  ? Culture 2,000 COLONIES/mL KLEBSIELLA PNEUMONIAE (A)  Final  ? Report Status 05/02/2022 FINAL  Final  ? Organism ID, Bacteria KLEBSIELLA PNEUMONIAE (A)   Final  ?    Susceptibility  ? Klebsiella pneumoniae - MIC*  ?  AMPICILLIN >=32 RESISTANT Resistant   ?  CEFAZOLIN <=4 SENSITIVE Sensitive   ?  CEFEPIME <=0.12 SENSITIVE Sensitive   ?  CEFTRIAXONE <=0.25 SENSITIVE Sensitive   ?  CIPROFLOXACIN <=0.25 SENSITIVE Sensitive   ?  GENTAMICIN <=1 SENSITIVE Sensitive   ?  IMIPENEM <=0.25 SENSITIVE Sensitive   ?  NITROFURANTOIN 64 INTERMEDIATE Intermediate   ?  TRIMETH/SULFA <=20 SENSITIVE Sensitive   ?  AMPICILLIN/SULBACTAM >=32 RESISTANT Resistant   ?  PIP/TAZO 8 SENSITIVE Sensitive   ?  * 2,000 COLONIES/mL KLEBSIELLA PNEUMONIAE  ?Blood Culture ID Panel (Reflexed)     Status: Abnormal  ? Collection Time: 04/29/22  3:29 PM  ?Result Value Ref Range Status  ?  Enterococcus faecalis NOT DETECTED NOT DETECTED Final  ? Enterococcus Faecium NOT DETECTED NOT DETECTED Final  ? Listeria monocytogenes NOT DETECTED NOT DETECTED Final  ? Staphylococcus species NOT DETECTED NOT DETECTED Final  ? Staphylococcus aureus (BCID) NOT DETECTED NOT DETECTED Final  ? Staphylococcus epidermidis NOT DETECTED NOT DETECTED Final  ? Staphylococcus lugdunensis NOT DETECTED NOT DETECTED Final  ? Streptococcus species DETECTED (A) NOT DETECTED Final  ?  Comment: Not Enterococcus species, Streptococcus agalactiae, Streptococcus pyogenes, or Streptococcus pneumoniae. ?CRITICAL RESULT CALLED TO, READ BACK BY AND VERIFIED WITH: ?RN ZAC PANTHER 04/30/22'@23'$ :13 BY TW ?  ? Streptococcus agalactiae NOT DETECTED NOT DETECTED Final  ? Streptococcus pneumoniae NOT DETECTED NOT DETECTED Final  ? Streptococcus pyogenes NOT DETECTED NOT DETECTED Final  ? A.calcoaceticus-baumannii NOT DETECTED NOT DETECTED Final  ? Bacteroides fragilis NOT DETECTED NOT DETECTED Final  ? Enterobacterales NOT DETECTED NOT DETECTED Final  ? Enterobacter cloacae complex NOT DETECTED NOT DETECTED Final  ? Escherichia coli NOT DETECTED NOT DETECTED Final  ? Klebsiella aerogenes NOT DETECTED NOT DETECTED Final  ? Klebsiella oxytoca NOT DETECTED  NOT DETECTED Final  ? Klebsiella pneumoniae NOT DETECTED NOT DETECTED Final  ? Proteus species NOT DETECTED NOT DETECTED Final  ? Salmonella species NOT DETECTED NOT DETECTED Final  ? Serratia marcescens

## 2022-05-04 NOTE — Assessment & Plan Note (Addendum)
--   full comfort measures  ?

## 2022-05-05 ENCOUNTER — Inpatient Hospital Stay (HOSPITAL_COMMUNITY): Payer: Managed Care, Other (non HMO)

## 2022-05-05 DIAGNOSIS — I9589 Other hypotension: Secondary | ICD-10-CM | POA: Diagnosis not present

## 2022-05-05 DIAGNOSIS — A419 Sepsis, unspecified organism: Secondary | ICD-10-CM | POA: Diagnosis not present

## 2022-05-05 DIAGNOSIS — G894 Chronic pain syndrome: Secondary | ICD-10-CM | POA: Diagnosis not present

## 2022-05-05 DIAGNOSIS — R7881 Bacteremia: Secondary | ICD-10-CM | POA: Diagnosis not present

## 2022-05-05 DIAGNOSIS — R188 Other ascites: Secondary | ICD-10-CM | POA: Diagnosis not present

## 2022-05-05 DIAGNOSIS — K7460 Unspecified cirrhosis of liver: Secondary | ICD-10-CM | POA: Diagnosis not present

## 2022-05-05 DIAGNOSIS — D638 Anemia in other chronic diseases classified elsewhere: Secondary | ICD-10-CM | POA: Diagnosis not present

## 2022-05-05 DIAGNOSIS — R6520 Severe sepsis without septic shock: Secondary | ICD-10-CM | POA: Diagnosis not present

## 2022-05-05 DIAGNOSIS — B955 Unspecified streptococcus as the cause of diseases classified elsewhere: Secondary | ICD-10-CM

## 2022-05-05 LAB — ECHOCARDIOGRAM COMPLETE
AR max vel: 2.91 cm2
AV Area VTI: 3.23 cm2
AV Area mean vel: 2.58 cm2
AV Mean grad: 4 mmHg
AV Peak grad: 8.9 mmHg
Ao pk vel: 1.49 m/s
Area-P 1/2: 3.02 cm2
Calc EF: 65.7 %
Height: 65 in
MV VTI: 4.23 cm2
S' Lateral: 2.5 cm
Single Plane A2C EF: 66.3 %
Single Plane A4C EF: 65.3 %
Weight: 3668.45 oz

## 2022-05-05 LAB — CBC
HCT: 29.5 % — ABNORMAL LOW (ref 36.0–46.0)
Hemoglobin: 9.6 g/dL — ABNORMAL LOW (ref 12.0–15.0)
MCH: 31.9 pg (ref 26.0–34.0)
MCHC: 32.5 g/dL (ref 30.0–36.0)
MCV: 98 fL (ref 80.0–100.0)
Platelets: 124 10*3/uL — ABNORMAL LOW (ref 150–400)
RBC: 3.01 MIL/uL — ABNORMAL LOW (ref 3.87–5.11)
RDW: 16.6 % — ABNORMAL HIGH (ref 11.5–15.5)
WBC: 27.8 10*3/uL — ABNORMAL HIGH (ref 4.0–10.5)
nRBC: 0 % (ref 0.0–0.2)

## 2022-05-05 LAB — BASIC METABOLIC PANEL
Anion gap: 6 (ref 5–15)
BUN: 33 mg/dL — ABNORMAL HIGH (ref 6–20)
CO2: 15 mmol/L — ABNORMAL LOW (ref 22–32)
Calcium: 7.7 mg/dL — ABNORMAL LOW (ref 8.9–10.3)
Chloride: 107 mmol/L (ref 98–111)
Creatinine, Ser: 1.38 mg/dL — ABNORMAL HIGH (ref 0.44–1.00)
GFR, Estimated: 45 mL/min — ABNORMAL LOW (ref >60)
Glucose, Bld: 88 mg/dL (ref 70–99)
Potassium: 4.5 mmol/L (ref 3.5–5.1)
Sodium: 128 mmol/L — ABNORMAL LOW (ref 135–145)

## 2022-05-05 LAB — MAGNESIUM: Magnesium: 2.7 mg/dL — ABNORMAL HIGH (ref 1.7–2.4)

## 2022-05-05 MED ORDER — LACTULOSE 10 GM/15ML PO SOLN
30.0000 g | Freq: Three times a day (TID) | ORAL | Status: DC
  Administered 2022-05-05 – 2022-05-06 (×2): 30 g via ORAL
  Filled 2022-05-05 (×3): qty 60

## 2022-05-05 NOTE — Progress Notes (Addendum)
Physical Therapy Treatment ?Patient Details ?Name: Kristie Brooks [facility] ?MRN: [id number] ?DOB: December 03, 1966 ?Today's Date: 05/05/2022 ? ? ?History of Present Illness Kristie Brooks is a 56 y.o. female with medical history significant of chronic respiratory failure on 2 LPM of oxygen, hypothyroidism, liver cirrhosis, bilateral lower extremity venous stasis dermatitis, right upper extremity DVT on Eliquis who presents to the emergency department due to generalized weakness.  Patient complained of being so weak she could barely get out of bed and that she has almost sitting nothing in the last 3 days.  She complained of lower extremity wound and pressure ulcer in the mid sacral area.  She was admitted from 1/10 to 1/12 due to right upper extremity DVT which was initially treated with IV heparin prior to transitioning to Eliquis on discharge.  She was also admitted from 12/15/2021 to 12/26/2021 due to severe sepsis secondary to cellulitis which was treated with IV vancomycin and was later transitioned to Bactrim DS on discharge. ? ?  ?PT Comments   ? ? Patient continues to demonstrate slow labored movement when sitting up at beside requiring mod assist and increased time to complete task. Patient is mod assist with sit to stand and mod/max assist with bed to chair transfer with observing poor trunk/core control, generalized weakness and poor balance needed to complete task independently. Patient requires moderate and verbal tactile cueing along with motivation to attempt transfers. Patient tolerated sitting up in chair after therapy - nurse notified. Patient will benefit from continued skilled physical therapy in hospital and recommended venue below to increase strength, balance, endurance for safe ADLs and gait.  ?  ?Recommendations for follow up therapy are one component of a multi-disciplinary discharge planning process, led by the attending physician.  Recommendations may be updated based on patient status, additional  functional criteria and insurance authorization. ? ?Follow Up Recommendations ? Skilled nursing-short term rehab (<3 hours/day) ?  ?  ?Assistance Recommended at Discharge Intermittent Supervision/Assistance  ?Patient can return home with the following A lot of help with bathing/dressing/bathroom;A lot of help with walking and/or transfers;Help with stairs or ramp for entrance;Assistance with cooking/housework ?  ?Equipment Recommendations ? None recommended by PT  ?  ?Recommendations for Other Services   ? ? ?  ?Precautions / Restrictions Precautions ?Precautions: Fall ?Restrictions ?Weight Bearing Restrictions: No  ?  ? ?Mobility ? Bed Mobility ?Overal bed mobility: Needs Assistance ?Bed Mobility: Sit to Supine ?  ?  ?  ?Sit to supine: Mod assist ?  ?General bed mobility comments: slow labored movement with cueing and motivation needed ?  ? ?Transfers ?Overall transfer level: Needs assistance ?Equipment used: Rolling walker (2 wheels) ?Transfers: Sit to/from Stand, Bed to chair/wheelchair/BSC ?Sit to Stand: Mod assist ?  ?Step pivot transfers: Mod assist ?  ?  ?  ?General transfer comment: unsteady labored movement with mostly shuffling of feet. Patient demonstrates poor standing balance and trunk control during transfer requring assist. ?  ? ?Ambulation/Gait ?Ambulation/Gait assistance: Mod assist, Max assist ?Gait Distance (Feet): 4 Feet ?Assistive device: Rolling walker (2 wheels) ?Gait Pattern/deviations: Decreased step length - left, Decreased step length - right, Decreased stride length, Shuffle ?Gait velocity: slow ?  ?  ?General Gait Details: limited to a few slow labored shuffling side steps due to generalized weakness and poor standing balance ? ? ?Stairs ?  ?  ?  ?  ?  ? ? ?Wheelchair Mobility ?  ? ?Modified Rankin (Stroke Patients Only) ?  ? ? ?  ?Balance  Overall balance assessment: Needs assistance ?Sitting-balance support: Feet supported, No upper extremity supported ?Sitting balance-Leahy Scale:  Fair ?Sitting balance - Comments: fair/good seated at EOB. Patient struggles with trunkl lean forward. ?  ?Standing balance support: During functional activity, Bilateral upper extremity supported ?Standing balance-Leahy Scale: Poor ?Standing balance comment: using RW ?  ?  ?  ?  ?  ?  ?  ?  ?  ?  ?  ?  ? ?  ?Cognition Arousal/Alertness: Awake/alert ?Behavior During Therapy: [facility] for tasks assessed/performed ?Overall Cognitive Status: Within Functional Limits for tasks assessed ?  ?  ?  ?  ?  ?  ?  ?  ?  ?  ?  ?  ?  ?  ?  ?  ?  ?  ?  ? ?  ?Exercises General Exercises - Lower Extremity ?Long Arc Quad: AROM, Both, 5 reps, Strengthening ?Straight Leg Raises: 5 reps, Both, AROM ?Toe Raises: AROM, Strengthening, Both, 10 reps ?Heel Raises: AROM, Both, 10 reps, Strengthening ? ?  ?General Comments   ?  ?  ? ?Pertinent Vitals/Pain Pain Assessment ?Pain Assessment: Faces ?Faces Pain Scale: Hurts even more ?Pain Location: BLE with pressure, movement ?Pain Descriptors / Indicators: Sore, Discomfort, Grimacing ?Pain Intervention(s): Monitored during session, Limited activity within patient's tolerance, Repositioned  ? ? ?Home Living   ?  ?  ?  ?  ?  ?  ?  ?  ?  ?   ?  ?Prior Function    ?  ?  ?   ? ?PT Goals (current goals can now be found in the care plan section) Acute Rehab PT Goals ?Patient Stated Goal: return home after rehab ?PT Goal Formulation: With patient ?Time For Goal Achievement: 05/17/22 ?Potential to Achieve Goals: Good ?Progress towards PT goals: Progressing toward goals ? ?  ?Frequency ? ? ? Min 3X/week ? ? ? ?  ?PT Plan Current plan remains appropriate  ? ? ?Co-evaluation   ?  ?  ?  ?  ? ?  ?AM-PAC PT "6 Clicks" Mobility   ?Outcome Measure ? Help needed turning from your back to your side while in a flat bed without using bedrails?: A Lot ?Help needed moving from lying on your back to sitting on the side of a flat bed without using bedrails?: A Lot ?Help needed moving to and from a bed to a chair (including a  wheelchair)?: A Lot ?Help needed standing up from a chair using your arms (e.g., wheelchair or bedside chair)?: A Lot ?Help needed to walk in hospital room?: A Lot ?Help needed climbing 3-5 steps with a railing? : Total ?6 Click Score: 11 ? ?  ?End of Session   ?Activity Tolerance: Patient tolerated treatment well;Patient limited by fatigue ?Patient left: in chair;with call bell/phone within reach ?Nurse Communication: Mobility status ?PT Visit Diagnosis: Unsteadiness on feet (R26.81);Other abnormalities of gait and mobility (R26.89);Muscle weakness (generalized) (M62.81) ?  ? ? ?Time: 5465-0354 ?PT Time Calculation (min) (ACUTE ONLY): 29 min ? ?Charges:  $Therapeutic Exercise: 8-22 mins ?$Therapeutic Activity: 8-22 mins          ?          ? ?12:22 PM, 05/05/22 ?Lestine Box, S/PT  ? ?During this treatment session, the therapist was present, participating in and directing the treatment. ? ?12:36 PM, 05/05/22 ?Lonell Grandchild, MPT ?Physical Therapist with [place] ?[facility] ?[phone] office ?[phone] mobile phone ? ? ?

## 2022-05-05 NOTE — Progress Notes (Signed)
?   ? ?[facility] ? ?Date of Admission:  04/29/2022    ?       ?Reason for visit: Follow up on bacteremia ? ?Current antibiotics: ?PCN 5/10- present ?  ?Previous antibiotics: ?Vancomycin 5/5-5/7 ?Ceftriaxone 5/5 -- 5/10 ? ?ASSESSMENT:   ? ?56 y.o. female admitted with: ? ?Strep gallolyticus bacteremia: patient admitted with severe sepsis and admit blood cx positive for this organism with repeat blood cx NGTD currently on penicillin.   ?Decompensated liver cirrhosis: she has hx of HCV s/p eradication in 2015.  She apparently had stage 2 fibrosis based on liver biopsy at time of her HCV treatment so unclear if this was understaged or if she has another non-viral etiology of her cirrhosis.  She follows with GI outpatient.  Her last EGD was 11/2021 and did not show varices although CT abd/pelvis was suggestive of this complication. ?Ascites: S/p paracentesis 5/9 with no evidence of SBP.  ?Chronic venous stasis dermatitis/lymphedema and lower extremity wounds: Evaluated by [facility] and recommendations in place. Difficult to control given her cirrhosis and 3rd spacing. ?Acute kidney injury: Creatinine noted to be worsened today. ?Severe leukocytosis: Significant increase noted today from yesterday.  Afebrile and noted to be more lethargic today per primary team.  No documented hypoxia. Difficult to discern if this related to her infection, liver disease, or a combination of the two.  ?Goals of care: Not a transplant candidate per GI. ? ?RECOMMENDATIONS:   ? ?Continue penicillin per pharmacy ?Follow up TTE ?Appreciate GI consult regarding colonoscopy in setting of Strep gallolyticus bacteremia ?Follow up repeat cultures ?Lab monitoring ?Consider repeat imaging if worsening leukocytosis tomorrow  ?Wound care ?Will follow ? ? ?Principal Problem: ?  Streptococcal gallolyticus bacteremia ?Active Problems: ?  Cirrhosis of liver with ascites ([place]) ?  DVT (deep venous thrombosis) ([place]) ?  Hypothyroidism ?   GERD (gastroesophageal reflux disease) ?  Open wound of left lower extremity ?  Leukocytosis ?  Hyperammonemia ([place]) ?  Hyperkalemia ?  Lactic acidosis ?  Obesity (BMI 30-39.9) ?  UTI due to Klebsiella species ?  Anemia, chronic disease ?  Chronic pain syndrome ?  Chronic hypotension ?  Infection due to Streptococcus gallolyticus ?  Thrombocytopenia ([place]) ? ? ? ?MEDICATIONS:   ? ?Scheduled Meds: ?? apixaban  5 mg Oral BID  ?? DULoxetine  30 mg Oral Daily  ?? feeding supplement  237 mL Oral BID BM  ?? folic acid  1 mg Oral Daily  ?? Gerhardt's butt cream   Topical TID  ?? lactulose  20 g Oral TID  ?? levothyroxine  50 mcg Oral Q0600  ?? midodrine  10 mg Oral TID WC  ?? nystatin   Topical BID  ?? pantoprazole  40 mg Oral BID AC  ?? vitamin B-12  250 mcg Oral Daily  ? ?Continuous Infusions: ?? penicillin g continuous IV infusion 12 Million Units (05/05/22 0555)  ? ?PRN Meds:.acetaminophen **OR** acetaminophen, ondansetron (ZOFRAN) IV, oxyCODONE, prochlorperazine ? ?SUBJECTIVE:  ? ?24 hour events:  ?Severe leukocytosis this morning and AKI ?Paracentesis and repeat blood cx NGTD ?No new imaging ?Seen by GI yesterday for discussion of possible colonoscopy ?TTE is pending ?Afebrile ?BP is stable ? ? ?  ?OBJECTIVE:  ? ?Blood pressure 97/61, pulse 76, temperature 98 ?F (36.7 ?C), temperature source Oral, resp. rate 14, height '5\' 5"'$  (1.651 m), weight 104 kg, SpO2 99 %. ?Body mass index is 38.15 kg/m?. ? ? ?Lab Results: ?Lab Results  ?Component  Value Date  ? WBC 27.8 (H) 05/05/2022  ? HGB 9.6 (L) 05/05/2022  ? HCT 29.5 (L) 05/05/2022  ? MCV 98.0 05/05/2022  ? PLT 124 (L) 05/05/2022  ?  ?Lab Results  ?Component Value Date  ? NA 128 (L) 05/05/2022  ? K 4.5 05/05/2022  ? CO2 15 (L) 05/05/2022  ? GLUCOSE 88 05/05/2022  ? BUN 33 (H) 05/05/2022  ? CREATININE 1.38 (H) 05/05/2022  ? CALCIUM 7.7 (L) 05/05/2022  ? GFRNONAA 45 (L) 05/05/2022  ?  ?Lab Results  ?Component Value Date  ? ALT 23 05/03/2022  ? AST 33 05/03/2022  ? ALKPHOS 165  (H) 05/03/2022  ? BILITOT 0.9 05/03/2022  ? ? ?No results found for: CRP ? ?No results found for: ESRSEDRATE ?  ?I have reviewed the micro and lab results in Epic. ? ?Imaging: ?US Paracentesis ? ?Result Date: 05/03/2022 ?Lavonia Dana, MD     05/03/2022 12:53 PM PreOperative Dx: Cirrhosis, ascites Postoperative Dx: Cirrhosis, ascites Procedure:   US guided paracentesis Radiologist:  Thornton Papas Anesthesia:  10 ml of1% lidocaine Specimen:  2.8 L of yellow ascitic fluid EBL:   < 1 ml Complications: None     ? ?Imaging independently reviewed in Epic.  ? ? ?Mignon Pine ?[facility] ?[place] ?[phone] pager ?05/05/2022, 9:50 AM ? ?I spent >5 min reviewing chart, formulating plan, and discussing with [facility] ? ?

## 2022-05-05 NOTE — Assessment & Plan Note (Addendum)
--   was treated with apixaban, now we are transitioning care to focus on comfort and dignity as patient is terminally ill  ?

## 2022-05-05 NOTE — Assessment & Plan Note (Addendum)
--  secondary to chronic liver disease ?-- following, no bleeding at this time ?-- no further blood draws as patient has transitioned to full comfort care  ?

## 2022-05-05 NOTE — Progress Notes (Signed)
? ?Gastroenterology Progress Note  ? ?Referring Provider: No ref. provider found ?Primary Care Physician:  Keane Police, MD ?Primary Gastroenterologist:  Dr. Marland [place] ?Patient ID: Kristie Brooks; [id number]; [birth year]  ? ?Subjective:   ? ?Patient trying to eat, appears to have difficulty manipulating utensils this morning. Alert and oriented to person and place. Voice quiet and barely audible. Somewhat lethargic. Denies abdominal pain. Last BM reported 05/02/22.  ? ? ?Objective:  ? ?Vital signs in last 24 hours: ?Temp:  [97.4 ?F (36.3 ?C)-98 ?F (36.7 ?C)] 98 ?F (36.7 ?C) (05/11 0430) ?Pulse Rate:  [76-82] 76 (05/11 0430) ?Resp:  [14-19] 14 (05/11 0430) ?BP: (97-107)/(58-62) 97/61 (05/11 0430) ?SpO2:  [94 %-99 %] 99 % (05/11 0430) ?Last BM Date : 05/02/22 ?General:   Alert,  but somewhat lethargic. Oriented to person/place/time. Chronically ill appearing, NAD ?Head:  Normocephalic and atraumatic. ?Eyes:  Sclera clear, no icterus.  ?Chest: CTA bilaterally without rales, rhonchi, crackles.    ?Heart:  Regular rate and rhythm; no murmurs, clicks, rubs,  or gallops. ?Abdomen:  Soft, mild nonspecific tenderness. Normal bowel sounds, without guarding, and without rebound.   ?Extremities:  wraps on both lower extremities. Bruising noted on all extremities.  ?Neurologic:  Alert and  oriented x4;  somewhat lethargic. ?asterixis but difficult to assess due to lethargy ?Skin:  Intact without significant lesions or rashes. ?Psych:  Alert and cooperative. Flat affect. ? ?Intake/Output from previous day: ?05/10 0701 - 05/11 0700 ?In: 2122.4 [P.O.:720; IV Piggyback:1402.4] ?Out: -  ?Intake/Output this shift: ?Total I/O ?In: 180 [P.O.:180] ?Out: -  ? ?Lab Results: ?CBC ?Recent Labs  ?  05/03/22 ?7322 05/05/22 ?0254  ?WBC 8.6 27.8*  ?HGB 9.0* 9.6*  ?HCT 27.6* 29.5*  ?MCV 96.5 98.0  ?PLT 142* 124*  ? ?BMET ?Recent Labs  ?  05/03/22 ?2706 05/05/22 ?2376  ?NA 130* 128*  ?K 4.1 4.5  ?CL 109 107  ?CO2 14* 15*  ?GLUCOSE 74 88  ?BUN 30*  33*  ?CREATININE 0.98 1.38*  ?CALCIUM 7.9* 7.7*  ? ?LFTs ?Recent Labs  ?  05/03/22 ?2831  ?BILITOT 0.9  ?ALKPHOS 165*  ?AST 33  ?ALT 23  ?PROT 4.8*  ?ALBUMIN 1.7*  ? ?No results for input(s): LIPASE in the last 72 hours. ?PT/INR ?Recent Labs  ?  05/03/22 ?5176  ?LABPROT 19.5*  ?INR 1.7*  ? ?    ? ?Imaging Studies: ?CT ABDOMEN PELVIS W CONTRAST ? ?Result Date: 04/29/2022 ?CLINICAL DATA:  Sepsis weakness diarrhea EXAM: CT ABDOMEN AND PELVIS WITH CONTRAST TECHNIQUE: Multidetector CT imaging of the abdomen and pelvis was performed using the standard protocol following bolus administration of intravenous contrast. RADIATION DOSE REDUCTION: This exam was performed according to the departmental dose-optimization program which includes automated exposure control, adjustment of the mA and/or kV according to patient size and/or use of iterative reconstruction technique. CONTRAST:  192m OMNIPAQUE IOHEXOL 300 MG/ML  SOLN COMPARISON:  CT 11/10/2021 FINDINGS: Lower chest: Lung bases demonstrate streaky atelectasis at the right base. Mild cardiomegaly. Trace pleural effusion on the right Hepatobiliary: Liver cirrhosis. No calcified gallstone or biliary dilatation. No focal hepatic abnormality. Pancreas: Unremarkable. No pancreatic ductal dilatation or surrounding inflammatory changes. Spleen: Enlarged measuring 15 cm AP. Adrenals/Urinary Tract: Adrenal glands are normal. Kidneys show no hydronephrosis. The bladder is decompressed Stomach/Bowel: The stomach is nonenlarged. Small stone in the appendix but no inflammation. No dilated small bowel. Diverticular disease of the colon. No acute wall thickening. Vascular/Lymphatic: Multiple gastroesophageal varices. Marked dilatation of the portal vein  with large varix to the left common iliac vein. Nonaneurysmal aorta. No suspicious lymph nodes. Reproductive: Calcified uterine fibroids.  No adnexal mass Other: No free air. Large volume of ascites. Generalized subcutaneous edema  Musculoskeletal: No acute osseous abnormality IMPRESSION: 1. Liver cirrhosis with signs for portal hypertension including splenomegaly, varices and large volume of ascites. 2. Calcified uterine fibroids 3. Cardiomegaly with trace right effusion 4. No evidence for a bowel obstruction. Electronically Signed   By: Donavan Foil M.D.   On: 04/29/2022 23:03  ? ?US Paracentesis ? ?Result Date: 05/03/2022 ?Lavonia Dana, MD     05/03/2022 12:53 PM PreOperative Dx: Cirrhosis, ascites Postoperative Dx: Cirrhosis, ascites Procedure:   US guided paracentesis Radiologist:  Thornton Papas Anesthesia:  10 ml of1% lidocaine Specimen:  2.8 L of yellow ascitic fluid EBL:   < 1 ml Complications: None   ? ?DG Chest Port 1 View ? ?Result Date: 04/29/2022 ?CLINICAL DATA:  Questionable sepsis - evaluate for abnormality EXAM: PORTABLE CHEST 1 VIEW COMPARISON:  Chest x-ray 12/15/2021. FINDINGS: Low lung volumes. No consolidation. No visible pleural effusions or pneumothorax. Cardiomediastinal silhouette is unchanged. IMPRESSION: Low lung volumes without evidence of acute cardiopulmonary disease. Electronically Signed   By: Margaretha Sheffield M.D.   On: 04/29/2022 15:54  ? ?[place] EKG SITE RITE ? ?Result Date: 05/02/2022 ?If [employer] not attached, placement could not be confirmed due to current cardiac rhythm. [2 weeks] ? 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  ? ?Cirrhosis:  Followed by Dr.Tendler with [facility] ([facility]).  History of hepatitis C, treated/eradicated previously.  Presents with decompensation.  MELD: 18.  Ammonia 13.  Takes lactulose 20 g 3 times daily, denied history of confusion.  Underwent paracentesis of 05/03/2022 with 2.8 L removed, no evidence of SBP.  CT abdomen pelvis with contrast this admission with cirrhosis with signs of portal hypertension including splenomegaly, varices, ascites.  EGD December 2022 with portal hypertensive gastropathy but no varices.  Today, patient seems more lethargic than yesterday although she remains alert and oriented.  Last bowel movement reported on the eighth.  May be developing mild hepatic encephalopathy. She is refusing lactulose about 1/2 her doses.  ? ?Streptococcus gallolyticus: Noted in blood cultures.  ID recommends colonoscopy for further evaluation as presence of organism has a strong association with colon cancer but could also be associated with underlying liver disease as well.  No prior colonoscopy.  No obvious findings on recent CT scan.  Would consider colonoscopy if clinical status improves but at this time she is currently appropriate for colonoscopy for bowel prep. ? ?Anemia: Likely multifactorial. Iron/tibc/sat all low. Normal ferritin. EGD December 2022 with portal hypertensive gastropathy, nonbleeding grade a esophagitis, no varices.  No reported melena or rectal bleeding.  Patient on supplemental folic acid and Q25 with last B12 973 in September 2022.  Consider colonoscopy once she improves from her acute illness. ? ? Leukocytosis: Bump in WBC to 27,800. She is afebrile. Remained on IV penicillin G.  ? ?Plan:  ? ?CMET, PT/INR daily. ?  Continue pantoprazole twice daily. ?Increase lactulose to 20m TID. ?Monitor closely for worsening mentation.  ?Colonoscopy once deemed to be stable and appropriate for colonoscopy/bowel prep.  ? ? LOS: 5 days  ? ?LLaureen Ochs LBobby Rumpf PA-C ?[facility] ?[phone]?5/11/202312:36 PM ?  ? ?

## 2022-05-05 NOTE — Progress Notes (Signed)
?PROGRESS NOTE ? ? ?Kristie Brooks [place]  PRF:[id number] DOB: 05-29-1966 DOA: 04/29/2022 ?PCP: Keane Police, MD  ? ?Chief Complaint  ?Patient presents with  ? Weakness  ? ?Level of care: Telemetry ? ?Brief Admission History:  ?56 y.o. female with medical history significant of chronic respiratory failure on 2 LPM of oxygen, hypothyroidism, liver cirrhosis, bilateral lower extremity venous stasis dermatitis, right upper extremity DVT on Eliquis admitted on 04/30/2022 with electrolyte abnormalities, transaminitis and concerns about cellulitis/infected wounds in the setting of liver cirrhosis with large volume ascites.   ? ?05/04/2022: 2/4 blood culture bottles positive for strep gallolyticus, ID consult requested, GI consult requested.  ? ?05/05/2022: bump in WBC, more somnolent.  ?  ?Assessment and Plan: ?* Streptococcal gallolyticus bacteremia ?-- appreciate ID consultation, Pt now on IV penicillin ?-- this bug associated with colon CA ?-- GI consultation requested in case endoscopy is warranted  ?-- TTE no evidence of vegetations ? ?Thrombocytopenia ([place]) ?--secondary to chronic liver disease ?-- following, no bleeding at this time ? ?Chronic hypotension ?--stable, resumed home midodrine for BP support  ? ?Chronic pain syndrome ?-- resumed home cymbalta and oxycodone ? ?Anemia, chronic disease ?-- Hg stable, follow ? ?UTI due to Klebsiella species ?-- Treated with ceftriaxone IV  ? ?Obesity (BMI 30-39.9) ?-- stable  ? ?Lactic acidosis ?-- treated and resolved ?-- sepsis ruled out  ? ?Hyperkalemia ?-- treated and resolved ? ?Hyperammonemia (HCC) ?-- continue lactulose as ordered (Pt has been reluctant to take) ?-- no BM reported, GI increased lactulose and will check ammonia in AM ? ?Leukocytosis ?-- WBC trending down ? ?Open wound of left lower extremity ?-- continue wound care treatment recommendations  ? ?GERD (gastroesophageal reflux disease) ?-- stable continue PPI for GI protection ? ?Hypothyroidism ?-- continue  daily levothyroxine therapy  ? ?DVT (deep venous thrombosis) (HCC) ?-- remains on apixaban ? ?Cirrhosis of liver with ascites (HCC) ?-- s/p paracentesis 2.3L fluid removed  ?-- no signs of SBP found ?-- resumed cirrhosis medications ?-- pt is very ill, appreciate palliative consultation ?-- if worsens or no improvement may need to readdress goals of care  ? ? ?DVT prophylaxis: SCDs ?Code Status: Full  ?Family Communication:  ?Disposition: Status is: Inpatient ?Remains inpatient appropriate because: IV antibiotics recommended ?  ?Consultants:  ?ID ?GI ?Procedures:  ?Paracentesis  ?Antimicrobials:  ?Ceftriaxone   ?Subjective: ?Pt more somnolent today, not eating well, no BM.   ?Objective: ?Vitals:  ? 05/04/22 1308 05/04/22 2057 05/05/22 0430 05/05/22 1316  ?BP: 107/62 (!) 104/58 97/61 (!) 97/52  ?Pulse: 82 77 76 76  ?Resp: '18 19 14 17  '$ ?Temp: 97.6 ?F (36.4 ?C) (!) 97.4 ?F (36.3 ?C) 98 ?F (36.7 ?C) 97.8 ?F (36.6 ?C)  ?TempSrc:  Oral Oral   ?SpO2: 94% 97% 99% 94%  ?Weight:      ?Height:      ? ? ?Intake/Output Summary (Last 24 hours) at 05/05/2022 1617 ?Last data filed at 05/05/2022 1300 ?Gross per 24 hour  ?Intake 1002.41 ml  ?Output --  ?Net 1002.41 ml  ? ?Filed Weights  ? 04/29/22 1710  ?Weight: 104 kg  ? ?Examination: ? ?General exam: chronically ill appearing, Appears calm and comfortable  ?Respiratory system: Clear to auscultation. Respiratory effort normal. ?Cardiovascular system: normal S1 & S2 heard. No JVD, murmurs, rubs, gallops or clicks. No pedal edema. ?Gastrointestinal system: Abdomen is nondistended, soft and nontender. No organomegaly or masses felt. Normal bowel sounds heard. ?Central nervous system: Alert and oriented. No focal neurological deficits. ?  Extremities: Symmetric 5 x 5 power. ?Skin: left leg wounds, sacral wounds. ?Psychiatry: Judgement and insight appear normal. Mood & affect appropriate.  ? ? ?Data Reviewed: I have personally reviewed following labs and imaging studies ? ?CBC: ?Recent Labs   ?Lab 04/29/22 ?1529 04/30/22 ?0932 05/03/22 ?6712 05/05/22 ?4580  ?WBC 22.1* 19.6* 8.6 27.8*  ?NEUTROABS 19.8*  --   --   --   ?HGB 10.8* 8.9* 9.0* 9.6*  ?HCT 33.5* 27.0* 27.6* 29.5*  ?MCV 98.0 96.1 96.5 98.0  ?PLT 184 125* 142* 124*  ? ? ?Basic Metabolic Panel: ?Recent Labs  ?Lab 04/29/22 ?1529 04/29/22 ?1535 04/30/22 ?9983 04/30/22 ?1131 05/03/22 ?3825 05/05/22 ?0539  ?NA 129*  --  126* 128* 130* 128*  ?K 5.4*  --  4.7 4.8 4.1 4.5  ?CL 106  --  106 105 109 107  ?CO2 15*  --  15* 15* 14* 15*  ?GLUCOSE 98  --  89 94 74 88  ?BUN 37*  --  33* 34* 30* 33*  ?CREATININE 1.09*  --  0.93 1.04* 0.98 1.38*  ?CALCIUM 7.8*  --  7.1* 7.6* 7.9* 7.7*  ?MG  --  2.3 2.1  --   --  2.7*  ?PHOS  --   --  3.8  --   --   --   ? ? ?CBG: ?No results for input(s): GLUCAP in the last 168 hours. ? ?Recent Results (from the past 240 hour(s))  ?Blood Culture (routine x 2)     Status: Abnormal  ? Collection Time: 04/29/22  3:29 PM  ? Specimen: BLOOD  ?Result Value Ref Range Status  ? Specimen Description   Final  ?  BLOOD BLOOD LEFT FOREARM ?Performed at [facility], [address] [place], [place] [address] ?  ? Special Requests   Final  ?  BOTTLES DRAWN AEROBIC AND ANAEROBIC Blood Culture adequate volume ?Performed at [facility], [address] [place], [place] [address] ?  ? Culture  Setup Time   Final  ?  GRAM POSITIVE COCCI ?Gram Stain Report Called to,Read Back By and Verified With: TO KFREEMAN,RN '@1905'$  ON 04/30/22 BY GMCGEEHON. ?ANAEROBIC BOTTLE ONLY ?GS DONE AT [facility] ?CRITICAL RESULT CALLED TO, READ BACK BY AND VERIFIED WITH: ?RN ZAC PANTHER 04/30/22'@23'$ :13 BY TW ?  ? Culture (A)  Final  ?  STREPTOCOCCUS GALLOLYTICUS ?THE SIGNIFICANCE OF ISOLATING THIS ORGANISM FROM A SINGLE SET OF BLOOD CULTURES WHEN MULTIPLE SETS ARE DRAWN IS UNCERTAIN. PLEASE NOTIFY THE MICROBIOLOGY DEPARTMENT WITHIN ONE WEEK IF SPECIATION AND SENSITIVITIES ARE REQUIRED. ?Performed at [facility], [place] [address] [place], [place] [address] ?  ? Report Status  05/02/2022 FINAL  Final  ?Urine Culture     Status: Abnormal  ? Collection Time: 04/29/22  3:29 PM  ? Specimen: In/Out Cath Urine  ?Result Value Ref Range Status  ? Specimen Description   Final  ?  IN/OUT CATH URINE ?Performed at [facility], [address], [place], [place] [address] ?  ? Special Requests   Final  ?  NONE ?Performed at [facility], [address], [place], [place] [address] ?  ? Culture 2,000 COLONIES/mL KLEBSIELLA PNEUMONIAE (A)  Final  ? Report Status 05/02/2022 FINAL  Final  ? Organism ID, Bacteria KLEBSIELLA PNEUMONIAE (A)  Final  ?    Susceptibility  ? Klebsiella pneumoniae - MIC*  ?  AMPICILLIN >=32 RESISTANT Resistant   ?  CEFAZOLIN <=4 SENSITIVE Sensitive   ?  CEFEPIME <=0.12 SENSITIVE Sensitive   ?  CEFTRIAXONE <=0.25 SENSITIVE Sensitive   ?  CIPROFLOXACIN <=0.25 SENSITIVE Sensitive   ?  GENTAMICIN <=1 SENSITIVE Sensitive   ?  IMIPENEM <=0.25 SENSITIVE Sensitive   ?  NITROFURANTOIN 64 INTERMEDIATE Intermediate   ?  TRIMETH/SULFA <=20 SENSITIVE Sensitive   ?  AMPICILLIN/SULBACTAM >=32 RESISTANT Resistant   ?  PIP/TAZO 8 SENSITIVE Sensitive   ?  * 2,000 COLONIES/mL KLEBSIELLA PNEUMONIAE  ?Blood Culture ID Panel (Reflexed)     Status: Abnormal  ? Collection Time: 04/29/22  3:29 PM  ?Result Value Ref Range Status  ? Enterococcus faecalis NOT DETECTED NOT DETECTED Final  ? Enterococcus Faecium NOT DETECTED NOT DETECTED Final  ? Listeria monocytogenes NOT DETECTED NOT DETECTED Final  ? Staphylococcus species NOT DETECTED NOT DETECTED Final  ? Staphylococcus aureus (BCID) NOT DETECTED NOT DETECTED Final  ? Staphylococcus epidermidis NOT DETECTED NOT DETECTED Final  ? Staphylococcus lugdunensis NOT DETECTED NOT DETECTED Final  ? Streptococcus species DETECTED (A) NOT DETECTED Final  ?  Comment: Not Enterococcus species, Streptococcus agalactiae, Streptococcus pyogenes, or Streptococcus pneumoniae. ?CRITICAL RESULT CALLED TO, READ BACK BY AND VERIFIED WITH: ?RN ZAC PANTHER 04/30/22'@23'$ :13 BY TW ?  ?  Streptococcus agalactiae NOT DETECTED NOT DETECTED Final  ? Streptococcus pneumoniae NOT DETECTED NOT DETECTED Final  ? Streptococcus pyogenes NOT DETECTED NOT DETECTED Final  ? A.calcoaceticus-baumannii N

## 2022-05-05 NOTE — Progress Notes (Signed)
*  PRELIMINARY RESULTS* ?Echocardiogram ?2D Echocardiogram has been performed. ? ?Kristie Brooks ?05/05/2022, 10:29 AM ?

## 2022-05-06 ENCOUNTER — Inpatient Hospital Stay (HOSPITAL_COMMUNITY): Payer: Managed Care, Other (non HMO)

## 2022-05-06 ENCOUNTER — Encounter (HOSPITAL_COMMUNITY): Payer: Self-pay | Admitting: Internal Medicine

## 2022-05-06 DIAGNOSIS — B955 Unspecified streptococcus as the cause of diseases classified elsewhere: Secondary | ICD-10-CM | POA: Diagnosis not present

## 2022-05-06 DIAGNOSIS — G894 Chronic pain syndrome: Secondary | ICD-10-CM | POA: Diagnosis not present

## 2022-05-06 DIAGNOSIS — K7460 Unspecified cirrhosis of liver: Secondary | ICD-10-CM | POA: Diagnosis not present

## 2022-05-06 DIAGNOSIS — R7881 Bacteremia: Secondary | ICD-10-CM | POA: Diagnosis not present

## 2022-05-06 DIAGNOSIS — I9589 Other hypotension: Secondary | ICD-10-CM | POA: Diagnosis not present

## 2022-05-06 DIAGNOSIS — R188 Other ascites: Secondary | ICD-10-CM | POA: Diagnosis not present

## 2022-05-06 LAB — COMPREHENSIVE METABOLIC PANEL
ALT: 18 U/L (ref 0–44)
AST: 26 U/L (ref 15–41)
Albumin: 1.7 g/dL — ABNORMAL LOW (ref 3.5–5.0)
Alkaline Phosphatase: 169 U/L — ABNORMAL HIGH (ref 38–126)
Anion gap: 9 (ref 5–15)
BUN: 36 mg/dL — ABNORMAL HIGH (ref 6–20)
CO2: 15 mmol/L — ABNORMAL LOW (ref 22–32)
Calcium: 7.7 mg/dL — ABNORMAL LOW (ref 8.9–10.3)
Chloride: 104 mmol/L (ref 98–111)
Creatinine, Ser: 1.74 mg/dL — ABNORMAL HIGH (ref 0.44–1.00)
GFR, Estimated: 34 mL/min — ABNORMAL LOW (ref >60)
Glucose, Bld: 93 mg/dL (ref 70–99)
Potassium: 4.8 mmol/L (ref 3.5–5.1)
Sodium: 128 mmol/L — ABNORMAL LOW (ref 135–145)
Total Bilirubin: 1.1 mg/dL (ref 0.3–1.2)
Total Protein: 4.7 g/dL — ABNORMAL LOW (ref 6.5–8.1)

## 2022-05-06 LAB — CBC WITH DIFFERENTIAL/PLATELET
Band Neutrophils: 9 %
Basophils Absolute: 0 10*3/uL (ref 0.0–0.1)
Basophils Relative: 0 %
Eosinophils Absolute: 0 10*3/uL (ref 0.0–0.5)
Eosinophils Relative: 0 %
HCT: 30 % — ABNORMAL LOW (ref 36.0–46.0)
Hemoglobin: 10.1 g/dL — ABNORMAL LOW (ref 12.0–15.0)
Lymphocytes Relative: 3 %
Lymphs Abs: 1.2 10*3/uL (ref 0.7–4.0)
MCH: 32.5 pg (ref 26.0–34.0)
MCHC: 33.7 g/dL (ref 30.0–36.0)
MCV: 96.5 fL (ref 80.0–100.0)
Monocytes Absolute: 1.2 10*3/uL — ABNORMAL HIGH (ref 0.1–1.0)
Monocytes Relative: 3 %
Neutro Abs: 37.5 10*3/uL — ABNORMAL HIGH (ref 1.7–7.7)
Neutrophils Relative %: 85 %
Platelets: 117 10*3/uL — ABNORMAL LOW (ref 150–400)
RBC: 3.11 MIL/uL — ABNORMAL LOW (ref 3.87–5.11)
RDW: 16.7 % — ABNORMAL HIGH (ref 11.5–15.5)
WBC: 39.9 10*3/uL — ABNORMAL HIGH (ref 4.0–10.5)
nRBC: 0 % (ref 0.0–0.2)

## 2022-05-06 LAB — MAGNESIUM: Magnesium: 2.7 mg/dL — ABNORMAL HIGH (ref 1.7–2.4)

## 2022-05-06 LAB — CULTURE, BLOOD (ROUTINE X 2)
Culture: NO GROWTH
Culture: NO GROWTH
Special Requests: ADEQUATE
Special Requests: ADEQUATE

## 2022-05-06 LAB — PROTIME-INR
INR: 2.5 — ABNORMAL HIGH (ref 0.8–1.2)
Prothrombin Time: 27.1 seconds — ABNORMAL HIGH (ref 11.4–15.2)

## 2022-05-06 LAB — AMMONIA: Ammonia: 18 umol/L (ref 9–35)

## 2022-05-06 MED ORDER — PENICILLIN G POTASSIUM 20000000 UNITS IJ SOLR
9.0000 10*6.[IU] | Freq: Two times a day (BID) | INTRAVENOUS | Status: DC
  Filled 2022-05-06 (×2): qty 9

## 2022-05-06 NOTE — Progress Notes (Addendum)
? ?Gastroenterology Progress Note  ? ?Referring Provider: No ref. provider found ?Primary Care Physician:  Keane Police, MD ?Primary Gastroenterologist:  Dr. Comer Locket [facility] gastroenterology) ? ?Patient ID: Kristie Brooks; [id number]; May 18, 1966  ? ? ?Subjective  ? ?Upon entering the room the patient is not very interactive.  She would open up one eye and then close her eyes.  Appears to be fairly lethargic.  Voice was very quiet almost not audible.  She was able to report that she was having pain in her stomach.  When asked if she was hungry or have much appetite she shook her head no.  She was able to follow couple of commands and shake her head yes or no regarding pain on exam.  Patient also reported that she has not had bowel movement, no documentation of bowel movement in the chart. ? ? ?Objective  ? ?Vital signs in last 24 hours ?Temp:  [97.3 ?F (36.3 ?C)-97.5 ?F (36.4 ?C)] 97.5 ?F (36.4 ?C) (05/12 0445) ?Pulse Rate:  [73-76] 76 (05/12 0445) ?Resp:  [14] 14 (05/12 0445) ?BP: (93-96)/(55-59) 93/55 (05/12 0445) ?SpO2:  [96 %-98 %] 96 % (05/12 0445) ?Last BM Date : 05/02/22 ? ?Physical Exam ?General: Ill-appearing jaundiced female ?Head:  Normocephalic and atraumatic. ?Eyes:  No icterus, sclera clear. Conjuctiva pink.  ?Mouth:  Without lesions, mucosa pink and moist.  ?Neck:  Supple, without thyromegaly or masses.  ?Heart:  S1, S2 present, no murmurs noted.  ?Lungs: Clear to auscultation bilaterally, without wheezing, rales, or rhonchi.  ?Abdomen:  Bowel sounds present, soft, non-tender, mildly distended. No HSM or hernias noted. No rebound or guarding. No masses appreciated  ?Pulses:  Normal pulses noted. ?Extremities:  Without clubbing or edema. ?Neurologic:  Alert and  oriented x4;  grossly normal neurologically. ?Skin:  Warm and dry, intact without significant lesions.  ?Psych:  Alert and cooperative. Normal mood and affect. ? ?Intake/Output from previous day: ?05/11 0701 - 05/12 0700 ?In: 480  [P.O.:480] ?Out: -  ?Intake/Output this shift: ?No intake/output data recorded. ? ?Lab Results ? ?Recent Labs  ?  05/05/22 ?0643 05/06/22 ?1023  ?WBC 27.8* 39.9*  ?HGB 9.6* 10.1*  ?HCT 29.5* 30.0*  ?PLT 124* 117*  ? ?BMET ?Recent Labs  ?  05/05/22 ?0643 05/06/22 ?1023  ?NA 128* 128*  ?K 4.5 4.8  ?CL 107 104  ?CO2 15* 15*  ?GLUCOSE 88 93  ?BUN 33* 36*  ?CREATININE 1.38* 1.74*  ?CALCIUM 7.7* 7.7*  ? ?LFT ?Recent Labs  ?  05/06/22 ?1023  ?PROT 4.7*  ?ALBUMIN 1.7*  ?AST 26  ?ALT 18  ?ALKPHOS 169*  ?BILITOT 1.1  ? ?PT/INR ?Recent Labs  ?  05/06/22 ?1023  ?LABPROT 27.1*  ?INR 2.5*  ? ?Hepatitis Panel ?No results for input(s): HEPBSAG, HCVAB, HEPAIGM, HEPBIGM in the last 72 hours. ? ? ?Studies/Results ?CT ABDOMEN PELVIS W CONTRAST ? ?Result Date: 04/29/2022 ?CLINICAL DATA:  Sepsis weakness diarrhea EXAM: CT ABDOMEN AND PELVIS WITH CONTRAST TECHNIQUE: Multidetector CT imaging of the abdomen and pelvis was performed using the standard protocol following bolus administration of intravenous contrast. RADIATION DOSE REDUCTION: This exam was performed according to the departmental dose-optimization program which includes automated exposure control, adjustment of the mA and/or kV according to patient size and/or use of iterative reconstruction technique. CONTRAST:  170m OMNIPAQUE IOHEXOL 300 MG/ML  SOLN COMPARISON:  CT 11/10/2021 FINDINGS: Lower chest: Lung bases demonstrate streaky atelectasis at the right base. Mild cardiomegaly. Trace pleural effusion on the right Hepatobiliary: Liver cirrhosis. No calcified  gallstone or biliary dilatation. No focal hepatic abnormality. Pancreas: Unremarkable. No pancreatic ductal dilatation or surrounding inflammatory changes. Spleen: Enlarged measuring 15 cm AP. Adrenals/Urinary Tract: Adrenal glands are normal. Kidneys show no hydronephrosis. The bladder is decompressed Stomach/Bowel: The stomach is nonenlarged. Small stone in the appendix but no inflammation. No dilated small bowel.  Diverticular disease of the colon. No acute wall thickening. 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 Nonaneurysmal aorta. No suspicious lymph nodes. Reproductive: Calcified uterine fibroids.  No adnexal mass Other: No free air. Large volume of ascites. Generalized subcutaneous edema Musculoskeletal: No acute osseous abnormality IMPRESSION: 1. Liver cirrhosis with signs for portal hypertension including splenomegaly, varices and large volume of ascites. 2. Calcified uterine fibroids 3. Cardiomegaly with trace right effusion 4. No evidence for a bowel obstruction. Electronically Signed   By: Donavan Foil M.D.   On: 04/29/2022 23:03  ? ?US Paracentesis ? ?Result Date: 05/03/2022 ?INDICATION: Cirrhosis, ascites EXAM: ULTRASOUND GUIDED DIAGNOSTIC AND THERAPEUTIC PARACENTESIS MEDICATIONS: None. COMPLICATIONS: None immediate. PROCEDURE: Informed written consent was obtained from the patient after a discussion of the risks, benefits and alternatives to treatment. A timeout was performed prior to the initiation of the procedure. Initial ultrasound scanning demonstrates a moderate amount of ascites within the right lower abdominal quadrant. The right lower abdomen was prepped and draped in the usual sterile fashion. 1% lidocaine was used for local anesthesia. Following this, a 5 [place] Yueh catheter was introduced. An ultrasound image was saved for documentation purposes. The paracentesis was performed. The catheter was removed and a dressing was applied. The patient tolerated the procedure well without immediate post procedural complication. FINDINGS: A total of approximately 2.8 L of yellow ascitic fluid was removed. Samples were sent to the laboratory as requested by the clinical team. IMPRESSION: Successful ultrasound-guided paracentesis yielding 2.8 liters of peritoneal fluid. Electronically Signed   By: Lavonia Dana M.D.    On: 05/03/2022 12:53  ? ?DG Chest Port 1 View ? ?Result Date: 04/29/2022 ?CLINICAL DATA:  Questionable sepsis - evaluate for abnormality EXAM: PORTABLE CHEST 1 VIEW COMPARISON:  Chest x-ray 12/15/2021. FINDINGS: Low lung volumes. No consolidation. No visible pleural effusions or pneumothorax. Cardiomediastinal silhouette is unchanged. IMPRESSION: Low lung volumes without evidence of acute cardiopulmonary disease. Electronically Signed   By: Margaretha Sheffield M.D.   On: 04/29/2022 15:54  ? ?ECHOCARDIOGRAM COMPLETE ? ?Result Date: 05/05/2022 ?   ECHOCARDIOGRAM REPORT   Patient Name:   Kristie Brooks Date of Exam: 05/05/2022 Medical Rec #:  [id number]           Height:       65.0 in Accession #:    [id number]          Weight:       229.3 lb Date of Birth:  06-13-1966           BSA:          2.096 m? Patient Age:    56 years            BP:           97/61 mmHg Patient Gender: F                   HR:           72 bpm. Exam Location:  [facility] Procedure: 2D Echo, Cardiac Doppler and Color Doppler Indications:    Bacteremia  History:  Patient has prior history of Echocardiogram examinations, most                 recent 12/16/2021. MRSA.  Sonographer:    Wenda Low Referring Phys: [id number] Mignon Pine  Sonographer Comments: Patient is morbidly obese. IMPRESSIONS  1. Left ventricular ejection fraction, by estimation, is 60 to 65%. The left ventricle has normal function. The left ventricle has no regional wall motion abnormalities. There is mild left ventricular hypertrophy. Left ventricular diastolic parameters are consistent with Grade I diastolic dysfunction (impaired relaxation).  2. Right ventricular systolic function is normal. The right ventricular size is normal. There is normal pulmonary artery systolic pressure.  3. Left atrial size was mildly dilated.  4. The mitral valve is normal in structure. Trivial mitral valve regurgitation. No evidence of mitral stenosis.  5. The aortic valve is normal in  structure. Aortic valve regurgitation is not visualized. No aortic stenosis is present.  6. The inferior vena cava is normal in size with greater than 50% respiratory variability, suggesting right atrial pressure of

## 2022-05-06 NOTE — Progress Notes (Signed)
05/06/2022 ?2:08 PM ? ?I had an in-person conference with patient's mother at bedside (husband at work) and we discussed patient's rapid decline over last 2 days.  Mother reported she has noticed how much more ill patient has become over last 2 days.  I reviewed today's labs showing worsening WBC now up to 39 despite antibiotics, INR rising, MELD score rising and I expressed to her my concern that patient appears to be dying.  She verbalized understanding. I expressed that despite maximal medical treatments patient continuing to decline.  She can barely speak, she is not eating or drinking and they had a very difficult time getting labs today and had multiple attempts. Mother expressed that she understands that patient is actively dying and she would like for the medical staff to focus on comfort and dignity now and to stop the lab draws, testing and focus on making her daughter comfortable. She expressed that patient is too ill now to be considered for a liver transplant and that the appointment was supposed to be in July for a transplant evalution.  She reports she has seen such a drastic decline in the patient that she had privately worried that patient would never make it to that appointment.  She shared that patient's father died at age 57 with complications of liver disease and she does not want to see her daughter suffer like that.  She asked me to focus on making patient comfortable. The lab presented to draw blood cultures and she asked that they leave and cancel the order to draw more blood.  After our discussion we have canceled the repeat CT and repeat labs and images and started comfort care orders.  I will ask TOC to speak with family about residential hospice choices. ? ?C. Wynetta Emery, MD  ?How to contact the [facility] Attending or Consulting provider [place] or covering provider during after hours [place], for this patient?  ?Check the care team in [facility] [facility] and look for a) attending/consulting TR[facility] provider listed  and b) the [facility] team listed ?Log into [email or web address] and use [place]'s universal password to access. If you do not have the password, please contact the hospital operator. ?Locate the [facility] provider you are looking for under [facility] and page to a number that you can be directly reached. ?If you still have difficulty reaching the provider, please page the [facility] (Director on Call) for the Hospitalists listed on amion for assistance. ? ?

## 2022-05-06 NOTE — Progress Notes (Signed)
PHARMACY NOTE:  ANTIMICROBIAL RENAL DOSAGE ADJUSTMENT ? ?Current antimicrobial regimen includes a mismatch between antimicrobial dosage and estimated renal function.  As per policy approved by the Pharmacy & Therapeutics and Medical Executive Committees, the antimicrobial dosage will be adjusted accordingly. ? ?Current antimicrobial dosage:  Penicillin 12 MU every 12 hours as a continuous infusion ? ?Indication: Strep gallolyticus bacteremia ? ?Renal Function: ? ?Estimated Creatinine Clearance: 42.7 mL/min (A) (by C-G formula based on SCr of 1.74 mg/dL (H)). ?'[]'$      On intermittent HD, scheduled: ?'[]'$      On CRRT ?   ?Antimicrobial dosage has been changed to:  Penicillin 9 MU every 12 hours as continuous infusion (75% reduction for renal fxn) ? ?Additional comments: ? ? ?Thank you for allowing pharmacy to be a part of this patient?s care. ? ?Alycia Rossetti, PharmD, BCPS ?Infectious Diseases Clinical Pharmacist ?05/06/2022 1:55 PM  ? ?**Pharmacist phone directory can now be found on [email or web address] ([email or web address]).  Listed under [place].  ?

## 2022-05-06 NOTE — Progress Notes (Signed)
?PROGRESS NOTE ? ? ?Kristie Brooks  FOY:[id number] DOB: 1966/05/24 DOA: 04/29/2022 ?PCP: Keane Police, MD  ? ?Chief Complaint  ?Patient presents with  ? Weakness  ? ?Level of care: Telemetry ? ?Brief Admission History:  ?56 y.o. female with medical history significant of chronic respiratory failure on 2 LPM of oxygen, hypothyroidism, liver cirrhosis, bilateral lower extremity venous stasis dermatitis, right upper extremity DVT on Eliquis admitted on 04/30/2022 with electrolyte abnormalities, transaminitis and concerns about cellulitis/infected wounds in the setting of liver cirrhosis with large volume ascites.   ? ?05/04/2022: 2/4 blood culture bottles positive for strep gallolyticus, ID consult requested, GI consult requested.  ? ?05/05/2022: bump in WBC, more somnolent.  ? ?05/06/2022: rapidly decompensating, WBC rising even more today, discussed with ID and GI prognosis very grave at this point and not thought to have a chance for meaningful recovery given she is too decompensated for liver transplant and rapidly progressive decline.  I had a conference with patient's mother at bedside and she expressed understanding of the rapid decline and requested for [place] to pursue full comfort care measures and stop lab draws, tests, etc.  Pt is residential hospice appropriate and I anticipate prognosis of less than 2 weeks.   ?  ?Assessment and Plan: ?* Streptococcal gallolyticus bacteremia ?-- appreciate ID consultation, Pt now on IV penicillin ?-- this bug associated with colon CA ?-- GI consultation requested in case endoscopy is warranted  ?-- TTE no evidence of vegetations ?-- unfortunately patient has rapidly decompensated and appears terminally ill, after discussion with family decision made to pursue full comfort measures.   ? ?Thrombocytopenia ([place]) ?--secondary to chronic liver disease ?-- following, no bleeding at this time ? ?Chronic hypotension ?--full comfort measures  ? ?Chronic pain syndrome ?-- full  comfort measures  ? ?Anemia, chronic disease ?-- transitioning to full comfort measures  ? ?UTI due to Klebsiella species ?-- Treated with ceftriaxone IV  ? ?Obesity (BMI 30-39.9) ?-- stable  ? ?Lactic acidosis ?-- treated and resolved ?-- sepsis ruled out  ? ?Hyperkalemia ?-- treated and resolved ? ?Hyperammonemia (HCC) ?-- continue lactulose as ordered (Pt has been reluctant to take) ?-- no BM reported, GI increased lactulose and will check ammonia in AM ? ?Leukocytosis ?-- WBC markedly elevated now up to 39 ?-- pt not responding favorably to treatments, after discussions with family decision made to transition to full comfort measures ? ?Open wound of left lower extremity ?-- continue wound care treatment recommendations  ?-- focus on comfort care now ? ?GERD (gastroesophageal reflux disease) ?-- stable ? ?Hypothyroidism ?-- continue daily levothyroxine therapy  ? ?DVT (deep venous thrombosis) ([place]) ?-- was treated with apixaban, now we are transitioning care to focus on comfort and dignity as patient is terminally ill  ? ?Cirrhosis of liver with ascites (HCC) ?-- s/p paracentesis 2.3L fluid removed  ?-- no signs of SBP found ?-- resumed cirrhosis medications ?-- pt is very ill, appreciate palliative consultation ?-- pt has rapidly decompensated and now terminally ill, transitioning to full comfort measures ? ? ?DVT prophylaxis: SCDs ?Code Status: Full  ?Family Communication:  ?Disposition: Status is: Inpatient ?Remains inpatient appropriate because: IV antibiotics recommended ?  ?Consultants:  ?ID ?GI ?Procedures:  ?Paracentesis  ?Antimicrobials:  ?Ceftriaxone   ?Subjective: ?Pt more somnolent today, not eating well, no BM.   ?Objective: ?Vitals:  ? 05/05/22 0430 05/05/22 1316 05/05/22 2203 05/06/22 0445  ?BP: 97/61 (!) 97/52 (!) 96/59 (!) 93/55  ?Pulse: 76 76 73 76  ?Resp:  $'14 17 14 14  'N$ ?Temp: 98 ?F (36.7 ?C) 97.8 ?F (36.6 ?C) (!) 97.3 ?F (36.3 ?C) (!) 97.5 ?F (36.4 ?C)  ?TempSrc: Oral  Oral Oral  ?SpO2: 99% 94%  98% 96%  ?Weight:      ?Height:      ? ? ?Intake/Output Summary (Last 24 hours) at 05/06/2022 1602 ?Last data filed at 05/06/2022 0900 ?Gross per 24 hour  ?Intake 120 ml  ?Output --  ?Net 120 ml  ? ?Filed Weights  ? 04/29/22 1710  ?Weight: 104 kg  ? ?Examination: ? ?General exam: severely jaundiced, appears terminally ill. Barely able to speak.  ?Respiratory system: shallow breathing bilaterally.  ?Cardiovascular system: normal S1 & S2 heard. anasarca ?Gastrointestinal system: Abdomen is nondistended, soft and nontender. No organomegaly or masses felt. Normal bowel sounds heard. ?Central nervous system: somnolent but arousable.  ?Extremities: Symmetric 5 x 5 power.  Anasarca.   ?Skin: left leg wounds, sacral wounds.  ?Psychiatry: Judgement and insight appear UTD Mood & affect UTD.  ? ? ?Data Reviewed: I have personally reviewed following labs and imaging studies ? ?CBC: ?Recent Labs  ?Lab 04/30/22 ?0643 05/03/22 ?9892 05/05/22 ?1194 05/06/22 ?1023  ?WBC 19.6* 8.6 27.8* 39.9*  ?NEUTROABS  --   --   --  37.5*  ?HGB 8.9* 9.0* 9.6* 10.1*  ?HCT 27.0* 27.6* 29.5* 30.0*  ?MCV 96.1 96.5 98.0 96.5  ?PLT 125* 142* 124* 117*  ? ? ?Basic Metabolic Panel: ?Recent Labs  ?Lab 04/30/22 ?0643 04/30/22 ?1131 05/03/22 ?1740 05/05/22 ?8144 05/06/22 ?1023  ?NA 126* 128* 130* 128* 128*  ?K 4.7 4.8 4.1 4.5 4.8  ?CL 106 105 109 107 104  ?CO2 15* 15* 14* 15* 15*  ?GLUCOSE 89 94 74 88 93  ?BUN 33* 34* 30* 33* 36*  ?CREATININE 0.93 1.04* 0.98 1.38* 1.74*  ?CALCIUM 7.1* 7.6* 7.9* 7.7* 7.7*  ?MG 2.1  --   --  2.7* 2.7*  ?PHOS 3.8  --   --   --   --   ? ? ?CBG: ?No results for input(s): GLUCAP in the last 168 hours. ? ?Recent Results (from the past 240 hour(s))  ?Blood Culture (routine x 2)     Status: Abnormal  ? Collection Time: 04/29/22  3:29 PM  ? Specimen: BLOOD  ?Result Value Ref Range Status  ? Specimen Description   Final  ?  BLOOD BLOOD LEFT FOREARM ?Performed at [facility], [address], [place], [place] [address] ?  ? Special  Requests   Final  ?  BOTTLES DRAWN AEROBIC AND ANAEROBIC Blood Culture adequate volume ?Performed at [facility], [address] [place], [place] [address] ?  ? Culture  Setup Time   Final  ?  GRAM POSITIVE COCCI ?Gram Stain Report Called to,Read Back By and Verified With: TO KFREEMAN,RN '@1905'$  ON 04/30/22 BY GMCGEEHON. ?ANAEROBIC BOTTLE ONLY ?GS DONE AT [facility] ?CRITICAL RESULT CALLED TO, READ BACK BY AND VERIFIED WITH: ?RN ZAC PANTHER 04/30/22'@23'$ :13 BY TW ?  ? Culture (A)  Final  ?  STREPTOCOCCUS GALLOLYTICUS ?THE SIGNIFICANCE OF ISOLATING THIS ORGANISM FROM A SINGLE SET OF BLOOD CULTURES WHEN MULTIPLE SETS ARE DRAWN IS UNCERTAIN. PLEASE NOTIFY THE MICROBIOLOGY DEPARTMENT WITHIN ONE WEEK IF SPECIATION AND SENSITIVITIES ARE REQUIRED. ?Performed at [facility], [place] [address], [place],  [address] ?  ? Report Status 05/02/2022 FINAL  Final  ?Urine Culture     Status: Abnormal  ? Collection Time: 04/29/22  3:29 PM  ? Specimen: In/Out  Cath Urine  ?Result Value Ref Range Status  ? Specimen Description   Final  ?  IN/OUT CATH URINE ?Performed at [facility], [address], [place], [place] [address] ?  ? Special Requests   Final  ?  NONE ?Performed at [facility], [address] [place], [place] [address] ?  ? Culture 2,000 COLONIES/mL KLEBSIELLA PNEUMONIAE (A)  Final  ? Report Status 05/02/2022 FINAL  Final  ? Organism ID, Bacteria KLEBSIELLA PNEUMONIAE (A)  Final  ?    Susceptibility  ? Klebsiella pneumoniae - MIC*  ?  AMPICILLIN >=32 RESISTANT Resistant   ?  CEFAZOLIN <=4 SENSITIVE Sensitive   ?  CEFEPIME <=0.12 SENSITIVE Sensitive   ?  CEFTRIAXONE <=0.25 SENSITIVE Sensitive   ?  CIPROFLOXACIN <=0.25 SENSITIVE Sensitive   ?  GENTAMICIN <=1 SENSITIVE Sensitive   ?  IMIPENEM <=0.25 SENSITIVE Sensitive   ?  NITROFURANTOIN 64 INTERMEDIATE Intermediate   ?  TRIMETH/SULFA <=20 SENSITIVE Sensitive   ?  AMPICILLIN/SULBACTAM >=32 RESISTANT Resistant   ?  PIP/TAZO 8 SENSITIVE Sensitive   ?  * 2,000 COLONIES/mL  KLEBSIELLA PNEUMONIAE  ?Blood Culture ID Panel (Reflexed)     Status: Abnormal  ? Collection Time: 04/29/22  3:29 PM  ?Result Value Ref Range Status  ? Enterococcus faecalis NOT DETECTED NOT DETECTED Final  ? Enterococcus

## 2022-05-06 NOTE — Progress Notes (Addendum)
?   ? ?  [facility] for Infectious Disease ? ?Date of Admission:  04/29/2022    ?       ?Reason for visit: Follow up on bacteremia ?  ?Current antibiotics: ?PCN 5/10- present ?  ?Previous antibiotics: ?Vancomycin 5/5-5/7 ?Ceftriaxone 5/5 -- 5/10 ? ?ASSESSMENT:   ? ?56 y.o. female admitted with: ? ?Strep gallolyticus bacteremia: patient admitted with severe sepsis and admit blood cx positive for this organism with repeat blood cx 5/7 finalized negative.  She is currently on penicillin.  TTE 5/11 was negative for vegetation. ?Decompensated liver cirrhosis: she has hx of HCV s/p eradication in 2015.  She apparently had stage 2 fibrosis based on liver biopsy at time of her HCV treatment so unclear if this was understaged or if she has another non-viral etiology of her cirrhosis.  She follows with GI outpatient.  Her last EGD was 11/2021 and did not show varices although CT abd/pelvis was suggestive of this complication. ?Ascites: S/p paracentesis 5/9 with no evidence of SBP.  ?Chronic venous stasis dermatitis/lymphedema and lower extremity wounds: Evaluated by [facility] and recommendations in place. Difficult to control given her cirrhosis and 3rd spacing. ?Acute kidney injury: Creatinine noted to be worsened 5/12.  Repeat labs today pending. ?Severe leukocytosis: Significant increase noted 5/11 from yesterday.  Afebrile.  Repeat labs pending today. ?Goals of care: Not a transplant candidate per GI. ? ?RECOMMENDATIONS:   ? ?Continue penicillin per pharmacy ?TTE was negative for IE.  Getting a TEE on her is not  without significant risk and may even be prohibitive given the potential concerns for varices.  She had prompt clearance of bacteremia, no obvious stigmata of IE and no significant valvular regurgitation on TTE.  Therefore, I think it is reasonable for now to hold off on TEE. ?Appreciate GI follow up for C-scope when deemed to be stable and appropriate for prep ?Will follow ? ?Addendum 1:20 PM ?Labs from this morning  reviewed showing worsening WBC, creatinine, and coagulopathy.  Concerning for worsening liver failure or infection and her MELD is calculated today at 27 points.  Would favor the former.  Discussed with Dr Wynetta Emery who will be reaching out to family to discuss goals of care.  In the interim, will plan for repeat CT abd/pelvis.  Will avoid contrast given her AKI.  Repeat blood cultures and consider C diff if having diarrhea although this will be difficult to discern since she is on lactulose.  Overall, prognosis appears poor.  ? ? ?Mignon Pine ?[facility] ?[place] ?[phone] pager ?05/06/2022, 9:59 AM ? ? ?I have spent >5 min reviewing chart, formulating plan, and discussing with TRH. ?

## 2022-05-07 DIAGNOSIS — R7881 Bacteremia: Secondary | ICD-10-CM | POA: Diagnosis not present

## 2022-05-07 DIAGNOSIS — G894 Chronic pain syndrome: Secondary | ICD-10-CM | POA: Diagnosis not present

## 2022-05-07 DIAGNOSIS — I9589 Other hypotension: Secondary | ICD-10-CM | POA: Diagnosis not present

## 2022-05-07 DIAGNOSIS — D638 Anemia in other chronic diseases classified elsewhere: Secondary | ICD-10-CM | POA: Diagnosis not present

## 2022-05-07 MED ORDER — MORPHINE SULFATE (CONCENTRATE) 10 MG/0.5ML PO SOLN
10.0000 mg | ORAL | Status: DC | PRN
  Administered 2022-05-09: 10 mg via ORAL
  Filled 2022-05-07: qty 0.5

## 2022-05-07 MED ORDER — HYDROMORPHONE HCL 1 MG/ML IJ SOLN
1.0000 mg | INTRAMUSCULAR | Status: DC | PRN
  Administered 2022-05-07 – 2022-05-08 (×6): 1 mg via INTRAVENOUS
  Filled 2022-05-07 (×6): qty 1

## 2022-05-07 NOTE — Progress Notes (Signed)
Robin, the tech, went into the patient's room to reposition patient. The patient's family members stated that patient is comfortable and does not need to be repositioned.  ?

## 2022-05-07 NOTE — Progress Notes (Signed)
Patient's husband requested we let the patient rest. He refused turns for her. Will continue to monitor.  ?

## 2022-05-07 NOTE — Progress Notes (Signed)
?PROGRESS NOTE ? ?Kristie Brooks [place]  XNT:[id number] DOB: 21-Oct-1966 DOA: 04/29/2022 ?PCP: Keane Police, MD  ? ?Chief Complaint  ?Patient presents with  ? Weakness  ? ?Level of care: Telemetry ? ?Brief Admission History:  ?56 y.o. female with medical history significant of chronic respiratory failure on 2 LPM of oxygen, hypothyroidism, liver cirrhosis, bilateral lower extremity venous stasis dermatitis, right upper extremity DVT on Eliquis admitted on 04/30/2022 with electrolyte abnormalities, transaminitis and concerns about cellulitis/infected wounds in the setting of liver cirrhosis with large volume ascites.   ? ?05/04/2022: 2/4 blood culture bottles positive for strep gallolyticus, ID consult requested, GI consult requested.  ? ?05/05/2022: bump in WBC, more somnolent.  ? ?05/06/2022: rapidly decompensating, WBC rising even more today, discussed with ID and GI prognosis very grave at this point and not thought to have a chance for meaningful recovery given she is too decompensated for liver transplant and rapidly progressive decline.  I had a conference with patient's mother at bedside and she expressed understanding of the rapid decline and requested for [place] to pursue full comfort care measures and stop lab draws, tests, etc.  Pt is residential hospice appropriate and I anticipate prognosis of less than 2 weeks.   ? ?05/07/2022:  I met with husband at bedside this morning.  He requested placement at [facility]. I informed him that they currently have no beds available but we will start the referral.  We will focus on comfort measures while patient waiting for residential hospice bed.  Pt says he thinks she requires too much care for him to manage alone at home.   ?  ?Assessment and Plan: ?* Streptococcal gallolyticus bacteremia ?-- appreciate ID consultation, Pt now on IV penicillin ?-- this bug associated with colon CA ?-- GI consultation requested in case endoscopy is warranted  ?-- TTE no evidence of  vegetations ?-- unfortunately patient has rapidly decompensated and appears terminally ill, after discussion with family decision made to pursue full comfort measures.   ? ?Thrombocytopenia ([place]) ?--secondary to chronic liver disease ?-- following, no bleeding at this time ?-- no further blood draws as patient has transitioned to full comfort care  ? ?Chronic hypotension ?--full comfort measures  ? ?Chronic pain syndrome ?-- full comfort measures  ? ?Anemia, chronic disease ?-- transitioning to full comfort measures  ? ?UTI due to Klebsiella species ?-- Treated with ceftriaxone IV  ? ?Obesity (BMI 30-39.9) ?-- stable  ? ?Lactic acidosis ?-- treated and resolved ?-- sepsis ruled out  ? ?Hyperkalemia ?-- treated and resolved ? ?Hyperammonemia (HCC) ?-- continue lactulose as ordered (Pt has been reluctant to take) ?-- no BM reported, GI increased lactulose and will check ammonia in AM ? ?Leukocytosis ?-- WBC markedly elevated now up to 39 ?-- pt not responding favorably to treatments, after discussions with family decision made to transition to full comfort measures ? ?Open wound of left lower extremity ?-- continue wound care treatment recommendations  ?-- focus on comfort care now ? ?GERD (gastroesophageal reflux disease) ?-- stable ? ?Hypothyroidism ?-- continue daily levothyroxine therapy  ? ?DVT (deep venous thrombosis) ([place]) ?-- was treated with apixaban, now we are transitioning care to focus on comfort and dignity as patient is terminally ill  ? ?Cirrhosis of liver with ascites (HCC) ?-- s/p paracentesis 2.3L fluid removed  ?-- no signs of SBP found ?-- resumed cirrhosis medications ?-- pt is very ill, appreciate palliative consultation ?-- pt has rapidly decompensated and now terminally ill, transitioning to full comfort  measures ?-- awaiting for a residential hospice bed ? ? ?DVT prophylaxis: SCDs ?Code Status: DNR  ?Family Communication: mother updated 5/12, husband 5/13 lengthy goals of care  discussions ?Disposition: Status is: Inpatient ?Remains inpatient appropriate because: IV antibiotics recommended ?  ?Consultants:  ?ID ?GI ?Procedures:  ?Paracentesis  ?Antimicrobials:  ?Ceftriaxone   ?Subjective: ?Husband found a pill in mouth.     ?Objective: ?Vitals:  ? 05/06/22 1624 05/06/22 2104 05/07/22 0500 05/07/22 1411  ?BP: 103/62 (!) 98/56 (!) 82/50 (!) 76/47  ?Pulse: 80 85 92 78  ?Resp: 15 16 14 16   ?Temp: 97.9 ?F (36.6 ?C) (!) 97.4 ?F (36.3 ?C) (!) 97.4 ?F (36.3 ?C) 97.8 ?F (36.6 ?C)  ?TempSrc:  Oral Oral Oral  ?SpO2: 100% 98% 97% 94%  ?Weight:      ?Height:      ? ? ?Intake/Output Summary (Last 24 hours) at 05/07/2022 1604 ?Last data filed at 05/07/2022 0900 ?Gross per 24 hour  ?Intake 60 ml  ?Output --  ?Net 60 ml  ? ?Filed Weights  ? 04/29/22 1710  ?Weight: 104 kg  ? ?Examination: ? ?General exam: severely jaundiced, appears terminally ill. Barely able to speak. Husband found a pill in mouth so not swallowing medications anymore.   ?Respiratory system: shallow breathing bilaterally.  ?Cardiovascular system: normal S1 & S2 heard. anasarca ?Gastrointestinal system: Abdomen is nondistended, soft and nontender. No organomegaly or masses felt. Normal bowel sounds heard. ?Central nervous system: somnolent but arousable.  ?Extremities: Symmetric 5 x 5 power.  Anasarca.   ?Skin: left leg wounds, sacral wounds.  ?Psychiatry: Judgement and insight appear UTD Mood & affect UTD.  ? ? ?Data Reviewed: I have personally reviewed following labs and imaging studies ? ?CBC: ?Recent Labs  ?Lab 05/03/22 ?3716 05/05/22 ?9678 05/06/22 ?1023  ?WBC 8.6 27.8* 39.9*  ?NEUTROABS  --   --  37.5*  ?HGB 9.0* 9.6* 10.1*  ?HCT 27.6* 29.5* 30.0*  ?MCV 96.5 98.0 96.5  ?PLT 142* 124* 117*  ? ? ?Basic Metabolic Panel: ?Recent Labs  ?Lab 05/03/22 ?9381 05/05/22 ?0175 05/06/22 ?1023  ?NA 130* 128* 128*  ?K 4.1 4.5 4.8  ?CL 109 107 104  ?CO2 14* 15* 15*  ?GLUCOSE 74 88 93  ?BUN 30* 33* 36*  ?CREATININE 0.98 1.38* 1.74*  ?CALCIUM 7.9* 7.7*  7.7*  ?MG  --  2.7* 2.7*  ? ? ?CBG: ?No results for input(s): GLUCAP in the last 168 hours. ? ?Recent Results (from the past 240 hour(s))  ?Blood Culture (routine x 2)     Status: Abnormal  ? Collection Time: 04/29/22  3:29 PM  ? Specimen: BLOOD  ?Result Value Ref Range Status  ? Specimen Description   Final  ?  BLOOD BLOOD LEFT FOREARM ?Performed at [facility], [address] [place], [place] [address] ?  ? Special Requests   Final  ?  BOTTLES DRAWN AEROBIC AND ANAEROBIC Blood Culture adequate volume ?Performed at [facility], [address], [place],  [address] ?  ? Culture  Setup Time   Final  ?  GRAM POSITIVE COCCI ?Gram Stain Report Called to,Read Back By and Verified With: TO KFREEMAN,RN @1905  ON 04/30/22 BY GMCGEEHON. ?ANAEROBIC BOTTLE ONLY ?GS DONE AT [facility] ?CRITICAL RESULT CALLED TO, READ BACK BY AND VERIFIED WITH: ?RN ZAC PANTHER 04/30/22@23 :13 BY TW ?  ? Culture (A)  Final  ?  STREPTOCOCCUS GALLOLYTICUS ?THE SIGNIFICANCE OF ISOLATING THIS ORGANISM FROM A SINGLE SET OF BLOOD CULTURES WHEN MULTIPLE SETS ARE DRAWN IS  UNCERTAIN. PLEASE NOTIFY THE MICROBIOLOGY DEPARTMENT WITHIN ONE WEEK IF SPECIATION AND SENSITIVITIES ARE REQUIRED. ?Performed at [facility], [place] [address], [place], [place] [address] ?  ? Report Status 05/02/2022 FINAL  Final  ?Urine Culture     Status: Abnormal  ? Collection Time: 04/29/22  3:29 PM  ? Specimen: In/Out Cath Urine  ?Result Value Ref Range Status  ? Specimen Description   Final  ?  IN/OUT CATH URINE ?Performed at [facility], [address], [place], [place] [address] ?  ? Special Requests   Final  ?  NONE ?Performed at [facility], [address] [place], [place] [address] ?  ? Culture 2,000 COLONIES/mL KLEBSIELLA PNEUMONIAE (A)  Final  ? Report Status 05/02/2022 FINAL  Final  ? Organism ID, Bacteria KLEBSIELLA PNEUMONIAE (A)  Final  ?    Susceptibility  ? Klebsiella pneumoniae - MIC*  ?  AMPICILLIN >=32 RESISTANT Resistant   ?  CEFAZOLIN <=4 SENSITIVE Sensitive   ?   CEFEPIME <=0.12 SENSITIVE Sensitive   ?  CEFTRIAXONE <=0.25 SENSITIVE Sensitive   ?  CIPROFLOXACIN <=0.25 SENSITIVE Sensitive   ?  GENTAMICIN <=1 SENSITIVE Sensitive   ?  IMIPENEM <=0.25 SENSITIVE Sensitive   ?  NITROFURAN

## 2022-05-07 NOTE — TOC Progression Note (Signed)
Transition of Care (TOC) - Progression Note  ? ? ?Patient Details  ?Name: Kristie Brooks [facility] ?MRN: [id number] ?Date of Birth: Feb 28, 1966 ? ?Transition of Care (TOC) CM/SW Contact  ?Lior Cartelli D, LCSW ?Phone Number: ?05/07/2022, 10:58 AM ? ?Clinical Narrative:    ?Per attending's request and confirmation of service and facility choice, patient referred to [facility]. ? ? ?  ?Barriers to Discharge: Continued Medical Work up, Ship broker ? ?Expected Discharge Plan and Services ?  ?  ?  ?  ?  ?                ?  ?  ?  ?  ?  ?  ?  ?  ?  ?  ? ? ?Social Determinants of Health (SDOH) Interventions ?  ? ?Readmission Risk Interventions ? ?  05/03/2022  ? 11:37 AM 12/24/2021  ?  1:25 PM 12/17/2021  ?  2:02 PM  ?Readmission Risk Prevention Plan  ?Post Dischage Appt  Complete   ?Medication Screening  Complete Complete  ?Transportation Screening Complete Complete Complete  ?[place] or Home Care Consult Complete    ?Social Work Consult for [place] Planning/Counseling Complete    ?Palliative Care Screening Not Complete    ?Palliative Care Screening Not Complete Comments waiting on consult    ?Medication Review Press photographer) Complete    ? ? ?

## 2022-05-08 DIAGNOSIS — R7881 Bacteremia: Secondary | ICD-10-CM | POA: Diagnosis not present

## 2022-05-08 DIAGNOSIS — B955 Unspecified streptococcus as the cause of diseases classified elsewhere: Secondary | ICD-10-CM | POA: Diagnosis not present

## 2022-05-08 LAB — CULTURE, BODY FLUID W GRAM STAIN -BOTTLE: Culture: NO GROWTH

## 2022-05-08 NOTE — Progress Notes (Signed)
Pt unresponsive, appears comfortable. Family at bedside. Advised to call for any needs, state understanding. ?

## 2022-05-08 NOTE — Plan of Care (Signed)
?  Problem: Education: ?Goal: Knowledge of General Education information will improve ?Description: Including pain rating scale, medication(s)/side effects and non-pharmacologic comfort measures ?Outcome: Progressing ?  ?Care plan and education discussed with Husband at bedside.  ?

## 2022-05-08 NOTE — Progress Notes (Signed)
?PROGRESS NOTE ? ?Kristie Brooks  JXB:[id number] DOB: [birth year] DOA: 04/29/2022 ?PCP: Keane Police, MD  ? ?Chief Complaint  ?Patient presents with  ? Weakness  ? ?Level of care: Palliative Care ? ?Brief Admission History:  ?56 y.o. female with medical history significant of chronic respiratory failure on 2 LPM of oxygen, hypothyroidism, liver cirrhosis, bilateral lower extremity venous stasis dermatitis, right upper extremity DVT on Eliquis admitted on 04/30/2022 with electrolyte abnormalities, transaminitis and concerns about cellulitis/infected wounds in the setting of liver cirrhosis with large volume ascites.   ? ?05/04/2022: 2/4 blood culture bottles positive for strep gallolyticus, ID consult requested, GI consult requested.  ? ?05/05/2022: bump in WBC, more somnolent.  ? ?05/06/2022: rapidly decompensating, WBC rising even more today, discussed with ID and GI prognosis very grave at this point and not thought to have a chance for meaningful recovery given she is too decompensated for liver transplant and rapidly progressive decline.  I had a conference with patient's mother at bedside and she expressed understanding of the rapid decline and requested for [place] to pursue full comfort care measures and stop lab draws, tests, etc.  Pt is residential hospice appropriate and I anticipate prognosis of less than 2 weeks.   ? ?05/07/2022:  I met with husband at bedside this morning.  He requested placement at [facility]. I informed him that they currently have no beds available but we will start the referral.  We will focus on comfort measures while patient waiting for residential hospice bed.  Pt says he thinks she requires too much care for him to manage alone at home.   ? ?05/08/2022: Pt remains on full comfort care. Awaiting for residential hospice bed.   ?  ?Assessment and Plan: ?* Streptococcal gallolyticus bacteremia ?-- appreciate ID consultation, Pt now on IV penicillin ?-- this bug associated with colon  CA ?-- GI consultation requested in case endoscopy is warranted  ?-- TTE no evidence of vegetations ?-- unfortunately patient has rapidly decompensated and appears terminally ill, after discussion with family decision made to pursue full comfort measures.   ? ?Thrombocytopenia ([place]) ?--secondary to chronic liver disease ?-- following, no bleeding at this time ?-- no further blood draws as patient has transitioned to full comfort care  ? ?Chronic hypotension ?--full comfort measures  ? ?Chronic pain syndrome ?-- full comfort measures  ? ?Anemia, chronic disease ?-- transitioning to full comfort measures  ? ?UTI due to Klebsiella species ?-- Treated with ceftriaxone IV  ? ?Obesity (BMI 30-39.9) ?-- stable  ? ?Lactic acidosis ?-- treated and resolved ?-- sepsis ruled out  ? ?Hyperkalemia ?-- treated and resolved ? ?Hyperammonemia (HCC) ?-- continue lactulose as ordered (Pt has been reluctant to take) ?-- no BM reported, GI increased lactulose and will check ammonia in AM ? ?Leukocytosis ?-- WBC markedly elevated now up to 39 ?-- pt not responding favorably to treatments, after discussions with family decision made to transition to full comfort measures ? ?Open wound of left lower extremity ?-- continue wound care treatment recommendations  ?-- focus on comfort care now ? ?GERD (gastroesophageal reflux disease) ?-- stable ? ?Hypothyroidism ?-- continue daily levothyroxine therapy  ? ?DVT (deep venous thrombosis) ([place]) ?-- was treated with apixaban, now we are transitioning care to focus on comfort and dignity as patient is terminally ill  ? ?Cirrhosis of liver with ascites (HCC) ?-- s/p paracentesis 2.3L fluid removed  ?-- no signs of SBP found ?-- resumed cirrhosis medications ?-- pt is very ill,  appreciate palliative consultation ?-- pt has rapidly decompensated and now terminally ill, transitioning to full comfort measures ?-- awaiting for a residential hospice bed ? ? ?DVT prophylaxis: SCDs ?Code Status: DNR   ?Family Communication: mother updated 5/12, husband 5/13 lengthy goals of care discussions ?Disposition: Status is: Inpatient ?Remains inpatient appropriate because: IV antibiotics recommended ?  ?Consultants:  ?ID ?GI ?Procedures:  ?Paracentesis  ?Antimicrobials:  ?Ceftriaxone   ?Subjective: ?Husband found a pill in mouth.     ?Objective: ?Vitals:  ? 05/07/22 1411 05/07/22 2212 05/08/22 0500 05/08/22 0538  ?BP: (!) 76/47 (!) 71/43    ?Pulse: 78 83    ?Resp: 16  (!) 8 (!) 3  ?Temp: 97.8 ?F (36.6 ?C) 98 ?F (36.7 ?C)    ?TempSrc: Oral Oral    ?SpO2: 94% 92%    ?Weight:      ?Height:      ? ? ?Intake/Output Summary (Last 24 hours) at 05/08/2022 1521 ?Last data filed at 05/08/2022 0900 ?Gross per 24 hour  ?Intake 0 ml  ?Output 100 ml  ?Net -100 ml  ? ?Filed Weights  ? 04/29/22 1710  ?Weight: 104 kg  ? ?Examination: ? ?General exam: severely jaundiced, appears terminally ill. Barely able to speak. Husband found a pill in mouth so not swallowing medications anymore.   ?Respiratory system: shallow breathing bilaterally.  ?Cardiovascular system: normal S1 & S2 heard. anasarca ?Gastrointestinal system: Abdomen is nondistended, soft and nontender. No organomegaly or masses felt. Normal bowel sounds heard. ?Central nervous system: somnolent but arousable.  ?Extremities: Symmetric 5 x 5 power.  Anasarca.   ?Skin: left leg wounds, sacral wounds.  ?Psychiatry: Judgement and insight appear UTD Mood & affect UTD.  ? ? ?Data Reviewed: I have personally reviewed following labs and imaging studies ? ?CBC: ?Recent Labs  ?Lab 05/03/22 ?4536 05/05/22 ?4680 05/06/22 ?1023  ?WBC 8.6 27.8* 39.9*  ?NEUTROABS  --   --  37.5*  ?HGB 9.0* 9.6* 10.1*  ?HCT 27.6* 29.5* 30.0*  ?MCV 96.5 98.0 96.5  ?PLT 142* 124* 117*  ? ? ?Basic Metabolic Panel: ?Recent Labs  ?Lab 05/03/22 ?3212 05/05/22 ?2482 05/06/22 ?1023  ?NA 130* 128* 128*  ?K 4.1 4.5 4.8  ?CL 109 107 104  ?CO2 14* 15* 15*  ?GLUCOSE 74 88 93  ?BUN 30* 33* 36*  ?CREATININE 0.98 1.38* 1.74*   ?CALCIUM 7.9* 7.7* 7.7*  ?MG  --  2.7* 2.7*  ? ? ?CBG: ?No results for input(s): GLUCAP in the last 168 hours. ? ?Recent Results (from the past 240 hour(s))  ?Blood Culture (routine x 2)     Status: Abnormal  ? Collection Time: 04/29/22  3:29 PM  ? Specimen: BLOOD  ?Result Value Ref Range Status  ? Specimen Description   Final  ?  BLOOD BLOOD LEFT FOREARM ?Performed at [facility], [address], [place], [place] [address] ?  ? Special Requests   Final  ?  BOTTLES DRAWN AEROBIC AND ANAEROBIC Blood Culture adequate volume ?Performed at [facility], [address], [place], [place] [address] ?  ? Culture  Setup Time   Final  ?  GRAM POSITIVE COCCI ?Gram Stain Report Called to,Read Back By and Verified With: TO KFREEMAN,RN _0  ON 04/30/22 BY GMCGEEHON. ?ANAEROBIC BOTTLE ONLY ?GS DONE AT [facility] ?CRITICAL RESULT CALLED TO, READ BACK BY AND VERIFIED WITH: ?RN ZAC PANTHER 04/30/22_1 :13 BY TW ?  ? Culture (A)  Final  ?  STREPTOCOCCUS GALLOLYTICUS ?THE SIGNIFICANCE OF ISOLATING THIS ORGANISM FROM A SINGLE  SET OF BLOOD CULTURES WHEN MULTIPLE SETS ARE DRAWN IS UNCERTAIN. PLEASE NOTIFY THE MICROBIOLOGY DEPARTMENT WITHIN ONE WEEK IF SPECIATION AND SENSITIVITIES ARE REQUIRED. ?Performed at [facility], [place] [address] [place], [place] [address] ?  ? Report Status 05/02/2022 FINAL  Final  ?Urine Culture     Status: Abnormal  ? Collection Time: 04/29/22  3:29 PM  ? Specimen: In/Out Cath Urine  ?Result Value Ref Range Status  ? Specimen Description   Final  ?  IN/OUT CATH URINE ?Performed at [facility], [address] [place], [place] [id number] ?  ? Special Requests   Final  ?  NONE ?Performed at [facility], [address], [place],  [address] ?  ? Culture 2,000 COLONIES/mL KLEBSIELLA PNEUMONIAE (A)  Final  ? Report Status 05/02/2022 FINAL  Final  ? Organism ID, Bacteria KLEBSIELLA PNEUMONIAE (A)  Final  ?    Susceptibility  ? Klebsiella pneumoniae - MIC*  ?  AMPICILLIN >=32 RESISTANT Resistant   ?  CEFAZOLIN <=4  SENSITIVE Sensitive   ?  CEFEPIME <=0.12 SENSITIVE Sensitive   ?  CEFTRIAXONE <=0.25 SENSITIVE Sensitive   ?  CIPROFLOXACIN <=0.25 SENSITIVE Sensitive   ?  GENTAMICIN <=1 SENSITIVE Sensitive   ?  IMIPENEM <=0.25 SENSITI

## 2022-05-08 NOTE — Progress Notes (Signed)
RNCM called [facility] [phone] spoke with Kern Reap who states no current beds available. Kern Reap will advise supervisor of this RNCM's call today.  ?  ?  ?TOC will continue to follow ?

## 2022-05-09 ENCOUNTER — Inpatient Hospital Stay (HOSPITAL_COMMUNITY): Admission: RE | Admit: 2022-05-09 | Payer: Managed Care, Other (non HMO) | Source: Ambulatory Visit

## 2022-05-09 ENCOUNTER — Inpatient Hospital Stay (HOSPITAL_COMMUNITY)
Admission: RE | Admit: 2022-05-09 | Discharge: 2022-05-26 | DRG: 951 | Disposition: E | Source: Ambulatory Visit | Attending: Family Medicine | Admitting: Family Medicine

## 2022-05-09 DIAGNOSIS — B954 Other streptococcus as the cause of diseases classified elsewhere: Secondary | ICD-10-CM | POA: Diagnosis present

## 2022-05-09 DIAGNOSIS — Z66 Do not resuscitate: Secondary | ICD-10-CM | POA: Diagnosis present

## 2022-05-09 DIAGNOSIS — Z7189 Other specified counseling: Secondary | ICD-10-CM | POA: Diagnosis not present

## 2022-05-09 DIAGNOSIS — B955 Unspecified streptococcus as the cause of diseases classified elsewhere: Secondary | ICD-10-CM | POA: Diagnosis present

## 2022-05-09 DIAGNOSIS — E876 Hypokalemia: Secondary | ICD-10-CM | POA: Diagnosis present

## 2022-05-09 DIAGNOSIS — K766 Portal hypertension: Secondary | ICD-10-CM | POA: Diagnosis present

## 2022-05-09 DIAGNOSIS — K7460 Unspecified cirrhosis of liver: Secondary | ICD-10-CM | POA: Diagnosis present

## 2022-05-09 DIAGNOSIS — A419 Sepsis, unspecified organism: Secondary | ICD-10-CM | POA: Diagnosis not present

## 2022-05-09 DIAGNOSIS — I9589 Other hypotension: Secondary | ICD-10-CM | POA: Diagnosis not present

## 2022-05-09 DIAGNOSIS — L89159 Pressure ulcer of sacral region, unspecified stage: Secondary | ICD-10-CM | POA: Diagnosis present

## 2022-05-09 DIAGNOSIS — Z888 Allergy status to other drugs, medicaments and biological substances status: Secondary | ICD-10-CM

## 2022-05-09 DIAGNOSIS — Z85828 Personal history of other malignant neoplasm of skin: Secondary | ICD-10-CM

## 2022-05-09 DIAGNOSIS — Z20822 Contact with and (suspected) exposure to covid-19: Secondary | ICD-10-CM | POA: Diagnosis present

## 2022-05-09 DIAGNOSIS — D638 Anemia in other chronic diseases classified elsewhere: Secondary | ICD-10-CM | POA: Diagnosis present

## 2022-05-09 DIAGNOSIS — K219 Gastro-esophageal reflux disease without esophagitis: Secondary | ICD-10-CM | POA: Diagnosis present

## 2022-05-09 DIAGNOSIS — K7210 Chronic hepatic failure without coma: Secondary | ICD-10-CM | POA: Diagnosis present

## 2022-05-09 DIAGNOSIS — Z881 Allergy status to other antibiotic agents status: Secondary | ICD-10-CM

## 2022-05-09 DIAGNOSIS — G894 Chronic pain syndrome: Secondary | ICD-10-CM | POA: Diagnosis present

## 2022-05-09 DIAGNOSIS — E7220 Disorder of urea cycle metabolism, unspecified: Secondary | ICD-10-CM | POA: Diagnosis present

## 2022-05-09 DIAGNOSIS — I8510 Secondary esophageal varices without bleeding: Secondary | ICD-10-CM | POA: Diagnosis present

## 2022-05-09 DIAGNOSIS — Z86718 Personal history of other venous thrombosis and embolism: Secondary | ICD-10-CM

## 2022-05-09 DIAGNOSIS — Z9981 Dependence on supplemental oxygen: Secondary | ICD-10-CM

## 2022-05-09 DIAGNOSIS — E0590 Thyrotoxicosis, unspecified without thyrotoxic crisis or storm: Secondary | ICD-10-CM | POA: Diagnosis present

## 2022-05-09 DIAGNOSIS — Z515 Encounter for palliative care: Principal | ICD-10-CM

## 2022-05-09 DIAGNOSIS — D509 Iron deficiency anemia, unspecified: Secondary | ICD-10-CM | POA: Diagnosis present

## 2022-05-09 DIAGNOSIS — I739 Peripheral vascular disease, unspecified: Secondary | ICD-10-CM | POA: Diagnosis present

## 2022-05-09 DIAGNOSIS — I8311 Varicose veins of right lower extremity with inflammation: Secondary | ICD-10-CM | POA: Diagnosis present

## 2022-05-09 DIAGNOSIS — R7881 Bacteremia: Secondary | ICD-10-CM | POA: Diagnosis present

## 2022-05-09 DIAGNOSIS — L8990 Pressure ulcer of unspecified site, unspecified stage: Secondary | ICD-10-CM | POA: Diagnosis present

## 2022-05-09 DIAGNOSIS — R188 Other ascites: Secondary | ICD-10-CM | POA: Diagnosis present

## 2022-05-09 DIAGNOSIS — K7581 Nonalcoholic steatohepatitis (NASH): Secondary | ICD-10-CM | POA: Diagnosis present

## 2022-05-09 DIAGNOSIS — J9610 Chronic respiratory failure, unspecified whether with hypoxia or hypercapnia: Secondary | ICD-10-CM | POA: Diagnosis present

## 2022-05-09 DIAGNOSIS — D696 Thrombocytopenia, unspecified: Secondary | ICD-10-CM

## 2022-05-09 DIAGNOSIS — R601 Generalized edema: Secondary | ICD-10-CM | POA: Diagnosis present

## 2022-05-09 DIAGNOSIS — E871 Hypo-osmolality and hyponatremia: Secondary | ICD-10-CM | POA: Diagnosis present

## 2022-05-09 DIAGNOSIS — E872 Acidosis, unspecified: Secondary | ICD-10-CM | POA: Diagnosis present

## 2022-05-09 DIAGNOSIS — E039 Hypothyroidism, unspecified: Secondary | ICD-10-CM | POA: Diagnosis present

## 2022-05-09 MED ORDER — SODIUM CHLORIDE 0.9 % IV SOLN
0.2500 mg/h | INTRAVENOUS | Status: DC
  Administered 2022-05-09: 0.25 mg/h via INTRAVENOUS
  Filled 2022-05-09: qty 2.5

## 2022-05-09 MED ORDER — LORAZEPAM 2 MG/ML IJ SOLN: 0.5000 mg | INTRAMUSCULAR | Status: DC | PRN

## 2022-05-09 MED ORDER — HYDROMORPHONE BOLUS VIA INFUSION: 0.2500 mg | INTRAVENOUS | Status: DC | PRN

## 2022-05-09 MED ORDER — PROCHLORPERAZINE EDISYLATE 10 MG/2ML IJ SOLN: 10.0000 mg | INTRAMUSCULAR | Status: DC | PRN

## 2022-05-09 MED ORDER — NYSTATIN 100000 UNIT/GM EX POWD: Freq: Two times a day (BID) | CUTANEOUS | Status: DC

## 2022-05-09 MED ORDER — HYDROMORPHONE BOLUS VIA INFUSION
0.2500 mg | INTRAVENOUS | Status: DC | PRN
  Administered 2022-05-09: 0.25 mg via INTRAVENOUS

## 2022-05-09 MED ORDER — ONDANSETRON HCL 4 MG/2ML IJ SOLN: 4.0000 mg | Freq: Four times a day (QID) | INTRAMUSCULAR | Status: DC | PRN

## 2022-05-09 MED ORDER — GERHARDT'S BUTT CREAM
TOPICAL_CREAM | Freq: Three times a day (TID) | CUTANEOUS | Status: DC
  Filled 2022-05-09: qty 1

## 2022-05-09 MED ORDER — SODIUM CHLORIDE 0.9 % IV SOLN
0.2500 mg/h | INTRAVENOUS | Status: DC
  Filled 2022-05-09: qty 2.5

## 2022-05-26 NOTE — Progress Notes (Deleted)
? ? ? ?  Was called to the patient room at 3:25 by family expressing urgent/emergent situation.  Went into the room nursing staff was present.  The patient had just passed away.  Her mother was at the bedside and having profound grief.  The chaplain and hospitalist were paged to notify of patient's passing and her mother's grief.  I sat with the patient's mother along with nursing staff to help comfort her until the chaplain arrived.  Dr. Wynetta Emery also came by to express condolences and offer support. ? ?The patient's husband is on his way. ? ?Thank you for your referral and allowing PMT to assist in [facility]'s care.  ? ?Walden Field, NP ?Palliative Medicine Team ?Phone: [phone] ? ?

## 2022-05-26 NOTE — Assessment & Plan Note (Signed)
--   full comfort measures,  GIP status  ?

## 2022-05-26 NOTE — Progress Notes (Signed)
?Daily Progress Note  ? ?Patient Name: Kristie Brooks       Date: 2022-05-27 ?DOB: 1966/09/24  Age: 56 y.o. MRN#: [id number] ?Attending Physician: Murlean Iba, MD ?Primary Care Physician: Keane Police, MD ?Admit Date: 04/29/2022 ?Length of Stay: 9 days ? ?Reason for Consultation/Follow-up: Establishing goals of care ? ?HPI/Patient Profile:  56 y.o. female  with past medical history of chronic respiratory failure on 2 LPM of oxygen, hypothyroidism, liver cirrhosis, bilateral lower extremity venous stasis dermatitis, right upper extremity DVT on Eliquis admitted on 04/30/2022 with electrolyte abnormalities, transaminitis and concerns about cellulitis/infected wounds in the setting of liver cirrhosis with large volume ascites. ?  ?PMT was consulted for [employer] conversations. The patient was previously seen by PMT in December of 2022 by Quinn Axe, NP. At that time the patient indicated her desire for full scope/full code.  She stated she would accept a trach and PEG tube if it became necessary. Plans were for outpatient skilled nursing facility/rehab and considering outpatient palliative care services. However, the patient had substantial decline over the weekend and is now unresponsive and was made comfort care by the hospitalist. ? ?Subjective:  ? ?Subjective: ?Chart Reviewed. Updates received. Patient Assessed. Created space and opportunity for patient  and family to explore thoughts and feelings regarding current medical situation. ? ?Today's Discussion: I met with the patient and multiple family members at the bedside including the patient's husband, his sister, and others.  We had a discussion about her current clinical status.  We reviewed that she was made comfort care and they verbalized agreement with this.  We discussed the role of comfort care and hospice evaluation (referred for residential hospice at [facility]).  I expressed my concern that because she cannot verbalize her wishes that she  cannot tell us that she is uncomfortable and this, in addition to the fact that she was having significant leg pain, makes me concerned that she may be having some discomfort.  I recommended starting a pain medication drip at a low dose to help promote comfort rather than wait for as needed administration as she cannot verbalize at this time.  The patient's husband is in agreement. ? ?They questioned her ability to transfer to residential hospice given her current state.  I agree that she is likely too unstable.  However, being that no bed is available I recommended that we cross that bridge when we get there.  They verbalized agreement. ? ?We discussed the patient's life, shared stories.  I provided emotional general supportive therapeutic listening, empathy, sharing of stories, and other techniques.  Answered all questions and addressed all concerns to the best my ability. ? ?Review of Systems  ?Unable to perform ROS: Acuity of condition  ? ?Objective:  ? ?Vital Signs:  ?BP (!) 64/27 (BP Location: Left Arm)   Pulse 76   Temp 98 ?F (36.7 ?C) (Oral)   Resp 13   Ht 5' 5"  (1.651 m)   Wt 104 kg   SpO2 (!) 87%   BMI 38.15 kg/m?  ? ?Physical Exam: ?Physical Exam ?Vitals and nursing note reviewed.  ?Constitutional:   ?   General: She is not in acute distress. ?   Appearance: She is ill-appearing and toxic-appearing.  ?HENT:  ?   Head: Normocephalic and atraumatic.  ?Pulmonary:  ?   Effort: Pulmonary effort is normal.  ?Abdominal:  ?   General: Abdomen is flat.  ?Neurological:  ?   Mental Status: She is unresponsive.  ? ? ?  Palliative Assessment/Data: 10% ? ? ?Assessment & Plan:  ? ?Impression: ?Present on Admission: ? Open wound of left lower extremity ? GERD (gastroesophageal reflux disease) ? DVT (deep venous thrombosis) ([place]) ? Cirrhosis of liver with ascites ([place]) ? Hypothyroidism ? Leukocytosis ? Hyperammonemia ([place]) ? Hyperkalemia ? Lactic acidosis ? Obesity (BMI 30-39.9) ? UTI due to Klebsiella species ?  Anemia, chronic disease ? Streptococcal gallolyticus bacteremia ? Chronic pain syndrome ? Chronic hypotension ? Thrombocytopenia ([place]) ? ?56 year old female with multiple chronic illnesses now with acute presentation for infection.  SBP appears to have been ruled out, did test positive for Klebsiella UTI with leukocytosis and she is on treatment.  Hyponatremia noted related to her cirrhosis.  Chronic lower extremity edema and cellulitis/wounds of bilateral lower extremities as well as sacral wound.  She has been referred to [facility] transplant center and has an evaluation scheduled in July.  Her MELD score on admission was 23.  Noted significant decline over the weekend and now a DNR and on comfort care.  We will proceed with pain medication drip to promote comfort.  Prognosis is grave and I do not feel that she will survive her hospitalization. ? ?SUMMARY OF RECOMMENDATIONS   ?Continue DNR ?Transition to full comfort care ?Comfort orders as per below ?PMT will continue to follow ? ?Symptom Management:  ?Ativan 0.5 mg IV every 4 hours as needed anxiety/fear ?Dilaudid infusion 0.25 to 1 mg IV continuous, titrate for distress or discomfort ?Dilaudid bolus via infusion 0.25 mg every 30 minutes as needed distress or discomfort ?Zofran 4 mg IV every 4 hours as needed nausea/vomiting ? ?Code Status: DNR ? ?Prognosis: Hours - Days ? ?Discharge Planning: Anticipated Hospital Death ? ?Discussed with: Medical team, nursing team, patient's family ? ?Thank you for allowing [place] to participate in the care of Khiara Shuping ?PMT will continue to support holistically. ? ?Billing based on MDM: High ? ?Problems Addressed: One acute or chronic illness or injury that poses a threat to life or bodily function ? ?Amount and/or Complexity of Data: Category 3:Discussion of management or test interpretation with external physician/other qualified health care professional/appropriate source (not separately reported) ? ?Risks:  Parenteral controlled substances and Decision not to resuscitate or to de-escalate care because of poor prognosis ? ? ?Walden Field, NP ?Palliative Medicine Team ? ?Team Phone # [phone] (Nights/Weekends) ? 08/24/2021, 8:17 AM  ? ?

## 2022-05-26 NOTE — Progress Notes (Signed)
Nutrition Brief Note ? ?Pt screened for MST. ?Chart reviewed. ?Pt is on comfort care.  ?No further nutrition interventions planned at this time.  ?Please re-consult as needed.  ? ?Clayborne Dana, RDN, LDN ?Clinical Nutrition ? ?

## 2022-05-26 NOTE — Assessment & Plan Note (Signed)
--   full comfort measures,  GIP status  ?-- pt on IV dilaudid infusion ?

## 2022-05-26 NOTE — TOC Progression Note (Addendum)
Transition of Care (TOC) - Progression Note  ? ? ?Patient Details  ?Name: Kristie Brooks [facility] ?MRN: [id number] ?Date of Birth: [birth year] ? ?Transition of Care (TOC) CM/SW Contact  ?Jaymond Waage D, LCSW ?Phone Number: ?05/20/2022, 10:06 AM ? ?Clinical Narrative:    ?Spoke with Casandra with [facility]. Larey Seat will contact family regarding admission. No current beds. Once admitted, patient will need to be GIP. Hospice nurse scheduled to come to hospital between 11:3--noon to meet with spouse.  ? ? ?  ?Barriers to Discharge: Continued Medical Work up, Ship broker ? ?Expected Discharge Plan and Services ?  ?  ?  ?  ?  ?                ?  ?  ?  ?  ?  ?  ?  ?  ?  ?  ? ? ?Social Determinants of Health (SDOH) Interventions ?  ? ?Readmission Risk Interventions ? ?  05/03/2022  ? 11:37 AM 12/24/2021  ?  1:25 PM 12/17/2021  ?  2:02 PM  ?Readmission Risk Prevention Plan  ?Post Dischage Appt  Complete   ?Medication Screening  Complete Complete  ?Transportation Screening Complete Complete Complete  ?[place] or Home Care Consult Complete    ?Social Work Consult for [place] Planning/Counseling Complete    ?Palliative Care Screening Not Complete    ?Palliative Care Screening Not Complete Comments waiting on consult    ?Medication Review Press photographer) Complete    ? ? ?

## 2022-05-26 NOTE — Assessment & Plan Note (Signed)
--   admitted to GIP status to continue full comfort measures  ?

## 2022-05-26 NOTE — Progress Notes (Signed)
Patient respirations 15 counted for full min. Patient appears to be comfortable at this present time. Breath sounds rattling. Family present at bedside. Will continue to monitor.  ?

## 2022-05-26 NOTE — Assessment & Plan Note (Signed)
--   end stage liver disease ?-- full comfort measures,  GIP status  ?

## 2022-05-26 NOTE — TOC Progression Note (Signed)
Transition of Care (TOC) - Progression Note  ? ? ?Patient Details  ?Name: Kristie Brooks [facility] ?MRN: [id number] ?Date of Birth: Apr 20, 1966 ? ?Transition of Care (TOC) CM/SW Contact  ?Kayla Deshaies D, LCSW ?Phone Number: ?Jun 06, 2022, 1:52 PM ? ?Clinical Narrative:    ?Patient admitted to hospice as a patient. Attending and nurses notified that patient can be flipped to GIP. ?  ?Barriers to Discharge: Continued Medical Work up, Ship broker ? ?Expected Discharge Plan and Services ?  ?  ?  ?  ?  ?                ?  ?  ?  ?  ?  ?  ?  ?  ?  ?  ? ? ?Social Determinants of Health (SDOH) Interventions ?  ? ?Readmission Risk Interventions ? ?  05/03/2022  ? 11:37 AM 12/24/2021  ?  1:25 PM 12/17/2021  ?  2:02 PM  ?Readmission Risk Prevention Plan  ?Post Dischage Appt  Complete   ?Medication Screening  Complete Complete  ?Transportation Screening Complete Complete Complete  ?[place] or Home Care Consult Complete    ?Social Work Consult for [place] Planning/Counseling Complete    ?Palliative Care Screening Not Complete    ?Palliative Care Screening Not Complete Comments waiting on consult    ?Medication Review Press photographer) Complete    ? ? ?

## 2022-05-26 NOTE — Progress Notes (Addendum)
This nurse called to room patient expired 1525. MD made aware. Emotional support provided to family. ?Pronounced by two nurses.  ?

## 2022-05-26 NOTE — Death Summary Note (Addendum)
?DEATH SUMMARY  ? ?Patient Details  ?Name: Kristie Brooks ?MRN: [id number] ?DOB: April 23, 1966 ? ?Admission/Discharge Information  ? ?Admit Date:  06/06/22  ?Date of Death: Date of Death: 06-06-22  ?Time of Death: Time of Death: 1525  ?Length of Stay: 0  ?Referring Physician: Keane Police, MD  ? ?Reason(s) for Hospitalization  ? 56 y.o. female with medical history significant of chronic respiratory failure on 2 LPM of oxygen, hypothyroidism, liver cirrhosis, bilateral lower extremity venous stasis dermatitis, right upper extremity DVT on Eliquis who presents to the emergency department due to generalized weakness.  Patient complained of being so weak she could barely get out of bed and that she has almost sitting nothing in the last 3 days.  She complained of lower extremity wound and pressure ulcer in the mid sacral area.  She was admitted from 1/10 to 1/12 due to right upper extremity DVT which was initially treated with IV heparin prior to transitioning to Eliquis on discharge.  She was also admitted from 12/15/2021 to 12/26/2021 due to severe sepsis secondary to cellulitis which was treated with IV vancomycin and was later transitioned to Bactrim DS on discharge. ?  ?ED Course prior to inpatient hospitalization:  ?In the emergency department, patient was intermittently tachypneic, BP was 106/65 and other vital signs were within normal range.  Work-up in the ED showed leukocytosis, normocytic anemia, hyponatremia, hypokalemia, bicarb 15, BUN/creatinine 37/1.09 (creatinine within baseline range).  Lactic acid 2.6 > 2.5, ammonia 42, ALP 207, AST 42, ALT 29, ammonia 42.  Influenza A, B, SARS coronavirus 2 was negative. ?CT abdomen and pelvis with contrast showed liver cirrhosis with signs for portal hypertension including splenomegaly, varices and large volume of ascites. ?Chest x-ray showed low lung volumes without evidence of acute cardiopulmonary disease ?She was treated with IV ceftriaxone and vancomycin,  IV hydration was provided.  Hospitalist was asked to admit patient for further evaluation and management. ? ?Diagnoses  ?Preliminary cause of death: LIVER FAILURE ?Secondary Diagnoses (including complications and co-morbidities):  ?Principal Problem: ?  Comfort measures only status ?Active Problems: ?  Pressure injury of skin ?  Anasarca ?  Ascites ?  Cirrhosis of liver with ascites ([place]) ?  Hypothyroidism ?  GERD (gastroesophageal reflux disease) ?  Hyperammonemia ([place]) ?  Lactic acidosis ?  Anemia, chronic disease ?  Streptococcal gallolyticus bacteremia ?  Chronic pain syndrome ?  Chronic hypotension ?  Thrombocytopenia (Kristie Brooks) ?  DNR (do not resuscitate) ? ? 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 GI evaluated and deemed not a candidate for liver transplant due to severely deconditioned state and recommended comfort measures.  Pt was transitioned to full comfort care and discharged to GIP status.   ? ?Pertinent Labs and Studies  ?Significant Diagnostic Studies ?CT ABDOMEN PELVIS W CONTRAST ? ?Result Date: 04/29/2022 ?CLINICAL DATA:  Sepsis weakness diarrhea EXAM: CT ABDOMEN AND PELVIS WITH CONTRAST TECHNIQUE: Multidetector CT imaging of the abdomen and pelvis was performed using the standard protocol following bolus administration of intravenous contrast. RADIATION DOSE REDUCTION: This exam was performed according to the departmental dose-optimization program which includes  automated exposure  control, adjustment of the mA and/or kV according to patient size and/or use of iterative reconstruction technique. CONTRAST:  19m OMNIPAQUE IOHEXOL 300 MG/ML  SOLN COMPARISON:  CT 11/10/2021 FINDINGS: Lower chest: Lung bases demonstrate streaky atelectasis at the right base. Mild cardiomegaly. Trace pleural effusion on the right Hepatobiliary: Liver cirrhosis. No calcified gallstone or biliary dilatation. No focal hepatic abnormality. Pancreas: Unremarkable. No pancreatic ductal dilatation or surrounding inflammatory changes. Spleen: Enlarged measuring 15 cm AP. Adrenals/Urinary Tract: Adrenal glands are normal. Kidneys show no hydronephrosis. The bladder is decompressed Stomach/Bowel: The stomach is nonenlarged. Small stone in the appendix but no inflammation. No dilated small bowel. Diverticular disease of the colon. No acute wall thickening. 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 Nonaneurysmal aorta. No suspicious lymph nodes. Reproductive: Calcified uterine fibroids.  No adnexal mass Other: No free air. Large volume of ascites. Generalized subcutaneous edema Musculoskeletal: No acute osseous abnormality IMPRESSION: 1. Liver cirrhosis with signs for portal hypertension including splenomegaly, varices and large volume of ascites. 2. Calcified uterine fibroids 3. Cardiomegaly with trace right effusion 4. No evidence for a bowel obstruction. Electronically Signed   By: KDonavan FoilM.D.   On: 04/29/2022 23:03  ? ?[place]acentesis ? ?Result Date: 05/03/2022 ?INDICATION: Cirrhosis, ascites EXAM: ULTRASOUND GUIDED DIAGNOSTIC AND THERAPEUTIC PARACENTESIS MEDICATIONS: None. COMPLICATIONS: None immediate. PROCEDURE: Informed written consent was obtained from the patient after a discussion of the risks, benefits and alternatives to treatment. A timeout was performed prior to the initiation of the procedure. Initial  ultrasound scanning demonstrates a moderate amount of ascites within the right lower abdominal quadrant. The right lower abdomen was prepped and draped in the usual sterile fashion. 1% lidocaine was used for local anesthesia. Following this, a 5 [place] catheter was introduced. An ultrasound image was saved for documentation purposes. The paracentesis was performed. The catheter was removed and a dressing was applied. The patient tolerated the procedure well without immediate post procedural complication. FINDINGS: A total of approximately 2.8 L of yellow ascitic fluid was removed. Samples were sent to the laboratory as requested by the clinical team. IMPRESSION: Successful ultrasound-guided paracentesis yielding 2.8 liters of peritoneal fluid. Electronically Signed   By: MLavonia DanaM.D.   On: 05/03/2022 12:53  ? ?DG Chest Port 1 View ? ?Result Date: 04/29/2022 ?CLINICAL DATA:  Questionable sepsis - evaluate for abnormality EXAM: PORTABLE CHEST 1 VIEW COMPARISON:  Chest x-ray 12/15/2021. FINDINGS: Low lung volumes. No consolidation. No visible pleural effusions or pneumothorax. Cardiomediastinal silhouette is unchanged. IMPRESSION: Low lung volumes without evidence of acute cardiopulmonary disease. Electronically Signed   By: FMargaretha SheffieldM.D.   On: 04/29/2022 15:54  ? ?ECHOCARDIOGRAM COMPLETE ? ?Result Date: 05/05/2022 ?   ECHOCARDIOGRAM REPORT   Patient Name:   RRUBYLEE ZAMARRIPADate of Exam: 05/05/2022 Medical Rec #:  [id number]          Height:       65.0 in Accession #:    [id number]         Weight:       229.3 lb Date of Birth:  126-Oct-1967          BSA:          2.096 m? Patient Age:    89years            BP:           97/61  mmHg Patient Gender: F                   HR:           72 bpm. Exam Location:  [facility] Procedure: 2D Echo, Cardiac Doppler and Color Doppler Indications:    Bacteremia  History:        Patient has prior history of Echocardiogram examinations, most                 recent  12/16/2021. MRSA.  Sonographer:    Wenda Low Referring Phys: [id number] Mignon Pine  Sonographer Comments: Patient is morbidly obese. IMPRESSIONS  1. Left ventricular ejection fraction, by estimation, is 60 to 65

## 2022-05-26 NOTE — Hospital Course (Signed)
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 GI evaluated and deemed not a candidate for liver transplant due to severely deconditioned state and recommended comfort measures.  Pt was transitioned to full comfort care and discharged to GIP status.   ?

## 2022-05-26 NOTE — Progress Notes (Signed)
? ? ? ?  Was called to the patient room at 3:25 by family expressing urgent/emergent situation.  Went into the room nursing staff was present.  The patient had just passed away.  Her mother was at the bedside and having profound grief.  The chaplain and hospitalist were paged to notify of patient's passing and her mother's grief.  I sat with the patient's mother along with nursing staff to help comfort her until the chaplain arrived.  Dr. Wynetta Emery also came by to express condolences and offer support. ?  ?The patient's husband is on his way. ?  ?Thank you for your referral and allowing PMT to assist in [facility]'s care.  ?  ?Walden Field, NP ?Palliative Medicine Team ?Phone: [phone] ?

## 2022-05-26 NOTE — Progress Notes (Signed)
Patient groaning upon entering room. Family at bedside. IV Dilaudid drip started. Provided emotional support to family. Will continue to do frequent rounding's.  ?

## 2022-05-26 NOTE — Progress Notes (Signed)
Pt unresponsive but appears comfortable and in no distress. Respirations 13 per full minute. Will continue to frequently round on patient.  ?

## 2022-05-26 NOTE — H&P (Signed)
?History and Physical  ?[place] ? ?Kristie Brooks [place] VFI:[id number] DOB: 11/18/1966 DOA: 2022/05/17 ? ?PCP: Keane Police, MD  ? ?Level of care: GIP  ? ?I have personally briefly reviewed patient's old medical records in [place] ? ? 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 GI evaluated and deemed not a candidate for liver transplant due to severely deconditioned state and recommended comfort measures.  Pt was transitioned to full comfort care and discharged to GIP status.   ? ?Review of Systems: Review of Systems  ?Unable to perform ROS: Patient unresponsive  ?  ?Past Medical History:  ?Diagnosis Date  ? Arthritis   ? Basal cell carcinoma 05/18/2021  ? nod-mid forehead ([facility])  ? GERD (gastroesophageal reflux disease)   ? Hyperthyroidism   ? Peripheral vascular disease ([place])   ? Pneumonia   ? ? ?Past Surgical History:  ?Procedure Laterality Date  ? ESOPHAGOGASTRODUODENOSCOPY (EGD) WITH PROPOFOL N/A 12/24/2021  ? Procedure: ESOPHAGOGASTRODUODENOSCOPY (EGD) WITH PROPOFOL;  Surgeon: Rogene Houston, MD;  Location: [facility];  Service: Endoscopy;  Laterality: N/A;  ? TONSILLECTOMY    ? ? ? reports that she has never smoked. She has never used smokeless tobacco. She reports that she does not drink alcohol and does not use drugs. ? ?Allergies  ?Allergen Reactions  ? Dakin's [Sodium Hypochlorite] Dermatitis  ? Zosyn [Piperacillin Sod-Tazobactam So] Other (See Comments)  ?  Blood count dropped  ? ? ?Family History  ?Problem Relation Age of Onset  ? Breast cancer Mother   ? Hypertension Mother   ?  Gastric cancer Father   ? Hypertension Brother   ? Alcohol abuse Brother   ? Pancreatitis Brother   ? ? ?Prior to Admission medications   ?Not on File  ? ? ?Physical Exam: ?General: Pt is terminally ill and severely jaundiced ?Cardiovascular: RRR, S1/S2 +, no rubs, no gallops ?Respiratory: CTA bilaterally, no wheezing, no rhonchi ?Abdominal: Soft, NT, ND, bowel sounds + ?Extremities: 3++ edema (anasarca) ? ?Labs on Admission: I have personally reviewed following labs and imaging studies ? ?CBC: ?Recent Labs  ?Lab 05/03/22 ?4166 05/05/22 ?0630 05/06/22 ?1023  ?WBC 8.6 27.8* 39.9*  ?NEUTROABS  --   --  37.5*  ?HGB 9.0* 9.6* 10.1*  ?HCT 27.6* 29.5* 30.0*  ?MCV 96.5 98.0 96.5  ?PLT 142* 124* 117*  ? ?Basic Metabolic Panel: ?Recent Labs  ?Lab 05/03/22 ?1601 05/05/22 ?0932 05/06/22 ?1023  ?NA 130* 128* 128*  ?K 4.1 4.5 4.8  ?CL 109 107 104  ?CO2 14* 15* 15*  ?GLUCOSE 74 88 93  ?BUN 30* 33* 36*  ?CREATININE 0.98 1.38* 1.74*  ?CALCIUM 7.9* 7.7* 7.7*  ?MG  --  2.7* 2.7*  ? ?GFR: ?Estimated Creatinine Clearance: 42.7 mL/min (A) (by C-G formula based on SCr of 1.74 mg/dL (H)). ?Liver Function Tests: ?Recent Labs  ?Lab 05/03/22 ?3557 05/06/22 ?1023  ?AST 33 26  ?ALT 23 18  ?ALKPHOS 165* 169*  ?BILITOT 0.9 1.1  ?PROT 4.8* 4.7*  ?ALBUMIN 1.7* 1.7*  ? ?No results for input(s): LIPASE, AMYLASE in the last 168 hours. ?Recent Labs  ?Lab 05/03/22 ?3220 05/06/22 ?1023  ?AMMONIA 13 18  ? ?Coagulation Profile: ?Recent  Labs  ?Lab 05/03/22 ?1497 05/06/22 ?1023  ?INR 1.7* 2.5*  ? ?Cardiac Enzymes: ?No results for input(s): CKTOTAL, CKMB, CKMBINDEX, TROPONINI in the last 168 hours. ?BNP (last 3 results) ?No results for input(s): PROBNP in the last 8760 hours. ?HbA1C: ?No results for input(s): HGBA1C in the last 72 hours. ?CBG: ?No results for input(s): GLUCAP in the last 168 hours. ?Lipid Profile: ?No results for input(s): CHOL, HDL, LDLCALC, TRIG, CHOLHDL, LDLDIRECT in the last 72 hours. ?Thyroid Function Tests: ?No results for input(s):  TSH, T4TOTAL, FREET4, T3FREE, THYROIDAB in the last 72 hours. ?Anemia Panel: ?No results for input(s): VITAMINB12, FOLATE, FERRITIN, TIBC, IRON, RETICCTPCT in the last 72 hours. ?Urine analysis: ?   ?Component Value Date/Time  ? COLORURINE YELLOW 04/29/2022 1529  ? APPEARANCEUR CLEAR 04/29/2022 1529  ? LABSPEC 1.017 04/29/2022 1529  ? PHURINE 5.0 04/29/2022 1529  ? GLUCOSEU NEGATIVE 04/29/2022 1529  ? HGBUR NEGATIVE 04/29/2022 1529  ? [place] NEGATIVE 04/29/2022 1529  ? [place] NEGATIVE 04/29/2022 1529  ? PROTEINUR NEGATIVE 04/29/2022 1529  ? NITRITE NEGATIVE 04/29/2022 1529  ? LEUKOCYTESUR NEGATIVE 04/29/2022 1529  ? ? ?Radiological Exams on Admission: ?No results found. ?Assessment/Plan ?Principal Problem: ?  Comfort measures only status ?Active Problems: ?  Pressure injury of skin ?  Anasarca ?  Ascites ?  Cirrhosis of liver with ascites ([place]) ?  Hypothyroidism ?  GERD (gastroesophageal reflux disease) ?  Hyperammonemia ([place]) ?  Lactic acidosis ?  Anemia, chronic disease ?  Streptococcal gallolyticus bacteremia ?  Chronic pain syndrome ?  Chronic hypotension ?  Thrombocytopenia ([place]) ?  ? ?Assessment and Plan: ?DNR (do not resuscitate) ?-- admitted to GIP status to continue full comfort measures  ? ?Thrombocytopenia ([place]) ?-- full comfort measures,  GIP status  ? ?Chronic hypotension ?-- full comfort measures,  GIP status  ? ?Chronic pain syndrome ?-- full comfort measures,  GIP status  ?-- pt on IV dilaudid infusion ? ?Streptococcal gallolyticus bacteremia ?-- full comfort measures,  GIP status  ? ?Lactic acidosis ?-- secondary to end stage liver disease ?-- full comfort measures,  GIP status  ? ?Hyperammonemia ([facility]) ?-- full comfort measures,  GIP status  ? ?GERD (gastroesophageal reflux disease) ?-- full comfort measures,  GIP status  ? ?Hypothyroidism ?-- full comfort measures,  GIP status  ? ?Cirrhosis of liver with ascites ([facility]) ?-- end stage liver disease ?-- full comfort measures,  GIP status   ? ?Ascites ?-- full comfort measures,  GIP status  ? ?Anasarca ?-- full comfort measures,  GIP status  ? ?Pressure injury of skin ?-- full comfort measures,  GIP status  ? ? ?DVT prophylaxis: full comfort measures   ?Code Status: DNR   ?Family Communication: mother at bedside   ?Disposition Plan: anticipate hospital death   ?Consults called:   ?Admission status: GIP  ?Level of care:  ?Irwin Brakeman MD ?[facility] ?How to contact the [facility] Attending or Consulting provider [place] or covering provider during after hours [place], for this patient?  ?Check the care team in [facility] and look for a) attending/consulting [facility] provider listed and b) the [facility] team listed ?Log into [email or web address] and use [place]'s universal password to access. If you do not have the password, please contact the hospital operator. ?Locate the [facility] provider you are looking for under [facility] and page to a number that you can be directly reached. ?If you still have difficulty reaching the provider, please page the [facility] (Director on Call) for  the Hospitalists listed on amion for assistance. ? ? ?If 7PM-7AM, please contact night-coverage ?[email or web address] ?Password [email or web address] ? ?2022-05-25, 3:33 PM  ?  ? ? ?

## 2022-05-26 NOTE — Progress Notes (Addendum)
?PROGRESS NOTE ? ?Kristie Brooks  QQV:[id number] DOB: 1966/02/10 DOA: 04/29/2022 ?PCP: Keane Police, MD  ? ?Chief Complaint  ?Patient presents with  ? Weakness  ? ?Level of care: Palliative Care ? ?Brief Admission History:  ?56 y.o. female with medical history significant of chronic respiratory failure on 2 LPM of oxygen, hypothyroidism, liver cirrhosis, bilateral lower extremity venous stasis dermatitis, right upper extremity DVT on Eliquis admitted on 04/30/2022 with electrolyte abnormalities, transaminitis and concerns about cellulitis/infected wounds in the setting of liver cirrhosis with large volume ascites.   ? ?05/04/2022: 2/4 blood culture bottles positive for strep gallolyticus, ID consult requested, GI consult requested.  ? ?05/05/2022: bump in WBC, more somnolent.  ? ?05/06/2022: rapidly decompensating, WBC rising even more today, discussed with ID and GI prognosis very grave at this point and not thought to have a chance for meaningful recovery given she is too decompensated for liver transplant and rapidly progressive decline.  I had a conference with patient's mother at bedside and she expressed understanding of the rapid decline and requested for [place] to pursue full comfort care measures and stop lab draws, tests, etc.  Pt is residential hospice appropriate and I anticipate prognosis of less than 2 weeks.   ? ?05/07/2022:  I met with husband at bedside this morning.  He requested placement at [facility]. I informed him that they currently have no beds available but we will start the referral.  We will focus on comfort measures while patient waiting for residential hospice bed.  Pt says he thinks she requires too much care for him to manage alone at home.   ? ?05/08/2022: Pt remains on full comfort care. Awaiting for residential hospice bed.   ?05/23/22: palliative team following, focus remains full comfort and dignity.  ?  ?Assessment and Plan: ?* Streptococcal gallolyticus bacteremia ?-- appreciate  ID consultation, Pt now on IV penicillin ?-- this bug associated with colon CA ?-- GI consultation requested in case endoscopy is warranted  ?-- TTE no evidence of vegetations ?-- unfortunately patient has rapidly decompensated and appears terminally ill, after discussion with family decision made to pursue full comfort measures.   ? ?Thrombocytopenia ([place]) ?--secondary to chronic liver disease ?-- following, no bleeding at this time ?-- no further blood draws as patient has transitioned to full comfort care  ? ?Chronic hypotension ?--full comfort measures  ? ?Chronic pain syndrome ?-- full comfort measures  ? ?Anemia, chronic disease ?-- transitioning to full comfort measures  ? ?UTI due to Klebsiella species ?-- Treated with ceftriaxone IV  ? ?Obesity (BMI 30-39.9) ?-- stable  ? ?Lactic acidosis ?-- treated and resolved ?-- sepsis ruled out  ? ?Hyperkalemia ?-- treated and resolved ? ?Hyperammonemia (HCC) ?-- continue lactulose as ordered (Pt has been reluctant to take) ?-- no BM reported, GI increased lactulose and will check ammonia in AM ? ?Leukocytosis ?-- WBC markedly elevated now up to 39 ?-- pt not responding favorably to treatments, after discussions with family decision made to transition to full comfort measures ? ?Open wound of left lower extremity ?-- continue wound care treatment recommendations  ?-- focus on comfort care now ? ?GERD (gastroesophageal reflux disease) ?-- stable ? ?Hypothyroidism ?-- continue daily levothyroxine therapy  ? ?DVT (deep venous thrombosis) ([place]) ?-- was treated with apixaban, now we are transitioning care to focus on comfort and dignity as patient is terminally ill  ? ?Cirrhosis of liver with ascites (HCC) ?-- s/p paracentesis 2.3L fluid removed  ?-- no signs of  SBP found ?-- resumed cirrhosis medications ?-- pt is very ill, appreciate palliative consultation ?-- pt has rapidly decompensated and now terminally ill, transitioning to full comfort measures ?-- awaiting for a  residential hospice bed ? ? ?DVT prophylaxis: full comfort care ?Code Status: DNR  ?Family Communication: mother updated 5/12, husband 5/13, mother, husband 5/14  ?Disposition: Status is: Inpatient ?Remains inpatient appropriate because: IV antibiotics recommended ?  ?Consultants:  ?ID ?GI ?Procedures:  ?Paracentesis  ?Antimicrobials:  ?Ceftriaxone   ?Subjective: ?Patient was gasping at times when I was in room.       ?Objective: ?Vitals:  ? 05/08/22 1836 05/08/22 2036 June 08, 2022 0409 June 08, 2022 0700  ?BP: (!) 69/34 (!) 66/34 (!) 64/27   ?Pulse: 75 74 76   ?Resp: (!) _0 ?Temp:      ?TempSrc:      ?SpO2: (!) 79% 91% (!) 87%   ?Weight:      ?Height:      ? ? ?Intake/Output Summary (Last 24 hours) at June 08, 2022 1207 ?Last data filed at 05/08/2022 1700 ?Gross per 24 hour  ?Intake 0 ml  ?Output --  ?Net 0 ml  ? ?Filed Weights  ? 04/29/22 1710  ?Weight: 104 kg  ? ?Examination: ? ?General exam: severely jaundiced, appears terminally ill. Barely able to speak. Husband found a pill in mouth so not swallowing medications anymore.   ?Respiratory system: shallow breathing bilaterally.  ?Cardiovascular system: normal S1 & S2 heard. anasarca ?Gastrointestinal system: Abdomen is nondistended, soft and nontender. No organomegaly or masses felt. Normal bowel sounds heard. ?Central nervous system: somnolent but arousable.  ?Extremities: Symmetric 5 x 5 power.  Anasarca.   ?Skin: left leg wounds, sacral wounds.  ?Psychiatry: Judgement and insight appear UTD Mood & affect UTD.  ? ? ?Data Reviewed: I have personally reviewed following labs and imaging studies ? ?CBC: ?Recent Labs  ?Lab 05/03/22 ?6962 05/05/22 ?9528 05/06/22 ?1023  ?WBC 8.6 27.8* 39.9*  ?NEUTROABS  --   --  37.5*  ?HGB 9.0* 9.6* 10.1*  ?HCT 27.6* 29.5* 30.0*  ?MCV 96.5 98.0 96.5  ?PLT 142* 124* 117*  ? ? ?Basic Metabolic Panel: ?Recent Labs  ?Lab 05/03/22 ?4132 05/05/22 ?4401 05/06/22 ?1023  ?NA 130* 128* 128*  ?K 4.1 4.5 4.8  ?CL 109 107 104  ?CO2 14* 15* 15*  ?GLUCOSE  74 88 93  ?BUN 30* 33* 36*  ?CREATININE 0.98 1.38* 1.74*  ?CALCIUM 7.9* 7.7* 7.7*  ?MG  --  2.7* 2.7*  ? ? ?CBG: ?No results for input(s): GLUCAP in the last 168 hours. ? ?Recent Results (from the past 240 hour(s))  ?Blood Culture (routine x 2)     Status: Abnormal  ? Collection Time: 04/29/22  3:29 PM  ? Specimen: BLOOD  ?Result Value Ref Range Status  ? Specimen Description   Final  ?  BLOOD BLOOD LEFT FOREARM ?Performed at [facility], [address] [place], [place] [address] ?  ? Special Requests   Final  ?  BOTTLES DRAWN AEROBIC AND ANAEROBIC Blood Culture adequate volume ?Performed at [facility], [address], [place], [place] [address] ?  ? Culture  Setup Time   Final  ?  GRAM POSITIVE COCCI ?Gram Stain Report Called to,Read Back By and Verified With: TO KFREEMAN,RN _1  ON 04/30/22 BY GMCGEEHON. ?ANAEROBIC BOTTLE ONLY ?GS DONE AT [facility] ?CRITICAL RESULT CALLED TO, READ BACK BY AND VERIFIED WITH: ?RN ZAC PANTHER 04/30/22_2 :13 BY TW ?  ? Culture (A)  Final  ?  STREPTOCOCCUS GALLOLYTICUS ?THE SIGNIFICANCE OF ISOLATING THIS ORGANISM FROM A SINGLE SET OF BLOOD CULTURES WHEN MULTIPLE SETS ARE DRAWN IS UNCERTAIN. PLEASE NOTIFY THE MICROBIOLOGY DEPARTMENT WITHIN ONE WEEK IF SPECIATION AND SENSITIVITIES ARE REQUIRED. ?Performed at [facility], [place] [address] [place], [place] [address] ?  ? Report Status 05/02/2022 FINAL  Final  ?Urine Culture     Status: Abnormal  ? Collection Time: 04/29/22  3:29 PM  ? Specimen: In/Out Cath Urine  ?Result Value Ref Range Status  ? Specimen Description   Final  ?  IN/OUT CATH URINE ?Performed at [facility], [address], [place], [place] [address] ?  ? Special Requests   Final  ?  NONE ?Performed at [facility], [address] [place], [place] [address] ?  ? Culture 2,000 COLONIES/mL KLEBSIELLA PNEUMONIAE (A)  Final  ? Report Status 05/02/2022 FINAL  Final  ? Organism ID, Bacteria KLEBSIELLA PNEUMONIAE (A)  Final  ?    Susceptibility  ? Klebsiella pneumoniae - MIC*  ?   AMPICILLIN >=32 RESISTANT Resistant   ?  CEFAZOLIN <=4 SENSITIVE Sensitive   ?  CEFEPIME <=0.12 SENSITIVE Sensitive   ?  CEFTRIAXONE <=0.25 SENSITIVE Sensitive   ?  CIPROFLOXACIN <=0.25 SENSITIVE Sens

## 2022-05-26 NOTE — Assessment & Plan Note (Signed)
--   secondary to end stage liver disease ?-- full comfort measures,  GIP status  ?

## 2022-05-26 NOTE — Discharge Summary (Signed)
Physician Discharge Summary  ?Kristie Brooks CHY:[id number] DOB: Aug 11, 1966 DOA: 04/29/2022 ? ?PCP: Keane Police, MD ? ?Admit date: 04/29/2022 ?Discharge date: 05/18/2022 ? ?Disposition:  GIP HOSPICE  ? ?Discharge Condition: Hospice   ?CODE STATUS: DNR   ? ?Brief Hospitalization Summary: ?Please see all hospital notes, images, labs for full details of the hospitalization. ?56 y.o. female with medical history significant of chronic respiratory failure on 2 LPM of oxygen, hypothyroidism, liver cirrhosis, bilateral lower extremity venous stasis dermatitis, right upper extremity DVT on Eliquis admitted on 04/30/2022 with electrolyte abnormalities, transaminitis and concerns about cellulitis/infected wounds in the setting of liver cirrhosis with large volume ascites.   ? ?05/04/2022: 2/4 blood culture bottles positive for strep gallolyticus, ID consult requested, GI consult requested.  ? ?05/05/2022: bump in WBC, more somnolent.  ? ?05/06/2022: rapidly decompensating, WBC rising even more today, discussed with ID and GI prognosis very grave at this point and not thought to have a chance for meaningful recovery given she is too decompensated for liver transplant and rapidly progressive decline.  I had a conference with patient's mother at bedside and she expressed understanding of the rapid decline and requested for Kristie Brooks to pursue full comfort care measures and stop lab draws, tests, etc.  Pt is residential hospice appropriate and I anticipate prognosis of less than 2 weeks.   ? ?05/07/2022:  I met with husband at bedside this morning.  He requested placement at [facility]. I informed him that they currently have no beds available but we will start the referral.  We will focus on comfort measures while patient waiting for residential hospice bed.  Pt says he thinks she requires too much care for him to manage alone at home.   ? ?05/08/2022: Pt remains on full comfort care. Awaiting for residential hospice bed.   ?05-18-22:  palliative team following, focus remains full comfort and dignity.  ? ?Assessment and Plan: ?* Streptococcal gallolyticus bacteremia ?-- appreciate ID consultation, Pt now on IV penicillin ?-- this bug associated with colon CA ?-- GI consultation requested in case endoscopy is warranted  ?-- TTE no evidence of vegetations ?-- unfortunately patient has rapidly decompensated and appears terminally ill, after discussion with family decision made to pursue full comfort measures.   ? ?Thrombocytopenia ([place]) ?--secondary to chronic liver disease ?-- following, no bleeding at this time ?-- no further blood draws as patient has transitioned to full comfort care  ? ?Chronic hypotension ?--full comfort measures  ? ?Chronic pain syndrome ?-- full comfort measures  ? ?Anemia, chronic disease ?-- transitioning to full comfort measures  ? ?UTI due to Klebsiella species ?-- Treated with ceftriaxone IV  ? ?Obesity (BMI 30-39.9) ?-- stable  ? ?Lactic acidosis ?-- treated and resolved ?-- sepsis ruled out  ? ?Hyperkalemia ?-- treated and resolved ? ?Hyperammonemia (HCC) ?-- continue lactulose as ordered (Pt has been reluctant to take) ?-- no BM reported, GI increased lactulose and will check ammonia in AM ? ?Leukocytosis ?-- WBC markedly elevated now up to 39 ?-- pt not responding favorably to treatments, after discussions with family decision made to transition to full comfort measures ? ?Open wound of left lower extremity ?-- continue wound care treatment recommendations  ?-- focus on comfort care now ? ?GERD (gastroesophageal reflux disease) ?-- stable ? ?Hypothyroidism ?-- continue daily levothyroxine therapy  ? ?DVT (deep venous thrombosis) ([place]) ?-- was treated with apixaban, now we are transitioning care to focus on comfort and dignity as patient is terminally ill  ? ?  Cirrhosis of liver with ascites (HCC) ?-- s/p paracentesis 2.3L fluid removed  ?-- no signs of SBP found ?-- resumed cirrhosis medications ?-- pt is very ill,  appreciate palliative consultation ?-- pt has rapidly decompensated and now terminally ill, transitioning to full comfort measures ?-- awaiting for a residential hospice bed ? ? ?Discharge Diagnoses:  ?Principal Problem: ?  Streptococcal gallolyticus bacteremia ?Active Problems: ?  Cirrhosis of liver with ascites ([place]) ?  DVT (deep venous thrombosis) ([place]) ?  Hypothyroidism ?  GERD (gastroesophageal reflux disease) ?  Open wound of left lower extremity ?  Leukocytosis ?  Hyperammonemia ([place]) ?  Hyperkalemia ?  Lactic acidosis ?  Obesity (BMI 30-39.9) ?  UTI due to Klebsiella species ?  Anemia, chronic disease ?  Chronic pain syndrome ?  Chronic hypotension ?  Thrombocytopenia ([place]) ? ? ? ? ?Procedures/Studies: ?CT ABDOMEN PELVIS W CONTRAST ? ?Result Date: 04/29/2022 ?CLINICAL DATA:  Sepsis weakness diarrhea EXAM: CT ABDOMEN AND PELVIS WITH CONTRAST TECHNIQUE: Multidetector CT imaging of the abdomen and pelvis was performed using the standard protocol following bolus administration of intravenous contrast. RADIATION DOSE REDUCTION: This exam was performed according to the departmental dose-optimization program which includes automated exposure control, adjustment of the mA and/or kV according to patient size and/or use of iterative reconstruction technique. CONTRAST:  150m OMNIPAQUE IOHEXOL 300 MG/ML  SOLN COMPARISON:  CT 11/10/2021 FINDINGS: Lower chest: Lung bases demonstrate streaky atelectasis at the right base. Mild cardiomegaly. Trace pleural effusion on the right Hepatobiliary: Liver cirrhosis. No calcified gallstone or biliary dilatation. No focal hepatic abnormality. Pancreas: Unremarkable. No pancreatic ductal dilatation or surrounding inflammatory changes. Spleen: Enlarged measuring 15 cm AP. Adrenals/Urinary Tract: Adrenal glands are normal. Kidneys show no hydronephrosis. The bladder is decompressed Stomach/Bowel: The stomach is nonenlarged. Small stone in the appendix but no inflammation. No dilated  small bowel. Diverticular disease of the colon. No acute wall thickening. 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 Nonaneurysmal aorta. No suspicious lymph nodes. Reproductive: Calcified uterine fibroids.  No adnexal mass Other: No free air. Large volume of ascites. Generalized subcutaneous edema Musculoskeletal: No acute osseous abnormality IMPRESSION: 1. Liver cirrhosis with signs for portal hypertension including splenomegaly, varices and large volume of ascites. 2. Calcified uterine fibroids 3. Cardiomegaly with trace right effusion 4. No evidence for a bowel obstruction. Electronically Signed   By: KDonavan FoilM.D.   On: 04/29/2022 23:03  ? ?UKoreaParacentesis ? ?Result Date: 05/03/2022 ?INDICATION: Cirrhosis, ascites EXAM: ULTRASOUND GUIDED DIAGNOSTIC AND THERAPEUTIC PARACENTESIS MEDICATIONS: None. COMPLICATIONS: None immediate. PROCEDURE: Informed written consent was obtained from the patient after a discussion of the risks, benefits and alternatives to treatment. A timeout was performed prior to the initiation of the procedure. Initial ultrasound scanning demonstrates a moderate amount of ascites within the right lower abdominal quadrant. The right lower abdomen was prepped and draped in the usual sterile fashion. 1% lidocaine was used for local anesthesia. Following this, a 5 [place] catheter was introduced. An ultrasound image was saved for documentation purposes. The paracentesis was performed. The catheter was removed and a dressing was applied. The patient tolerated the procedure well without immediate post procedural complication. FINDINGS: A total of approximately 2.8 L of yellow ascitic fluid was removed. Samples were sent to the laboratory as requested by the clinical team. IMPRESSION: Successful ultrasound-guided paracentesis yielding 2.8 liters of peritoneal fluid. Electronically Signed   By: MCrist InfanteD.  On: 05/03/2022 12:53  ? ?DG Chest Port 1 View ? ?Result Date: 04/29/2022 ?CLINICAL DATA:  Questionable sepsis - evaluate for abnormality EXAM: PORTABLE CHEST 1 VIEW COMPARISON:  Chest x-ray 12/21/

## 2022-05-26 DEATH — deceased

## 2022-10-19 LAB — ACID FAST CULTURE WITH REFLEXED SENSITIVITIES (MYCOBACTERIA): Acid Fast Culture: NEGATIVE

## 2023-01-26 IMAGING — CT CT ABD-PELV W/ CM
2 of 6 series · 16 of 46 positions shown, 18 images · IV contrast (Omnipaque or Isovue)
Comparison: CT 11/10/2021

CLINICAL DATA: Sepsis weakness diarrhea

EXAM:
CT ABDOMEN AND PELVIS WITH CONTRAST
TECHNIQUE: Multidetector CT imaging of the abdomen and pelvis was performed
using the standard protocol following bolus administration of
intravenous contrast.

[Series 3: thins · axial · 1.04mm/px · z∈[-618,-136]mm · 13 of 749 slices shown, 15 images]
[im 30/749  soft-tissue]
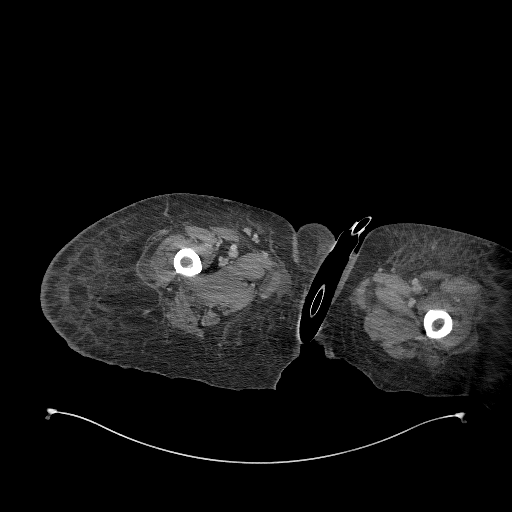
[im 30/749  bone]
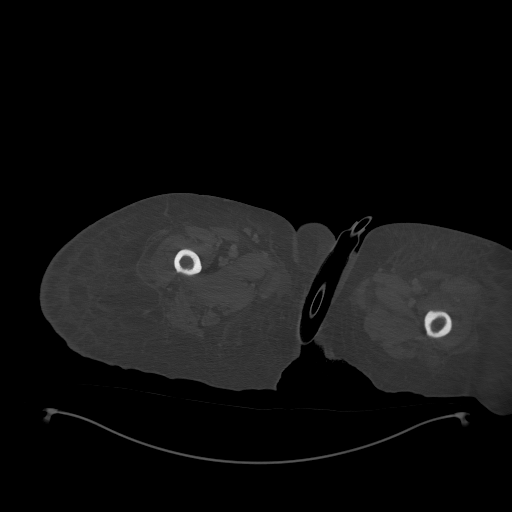
[im 90/749  soft-tissue]
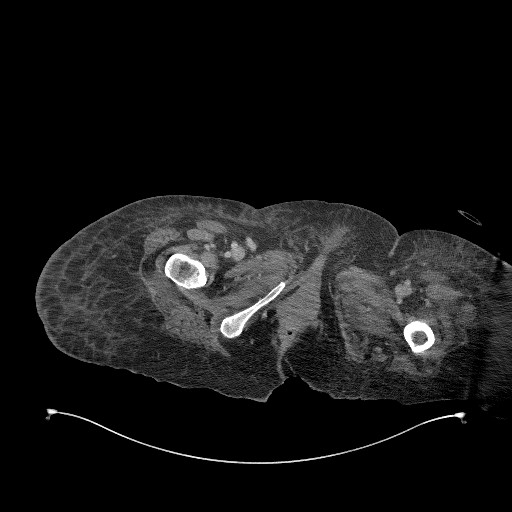
[im 150/749  soft-tissue]
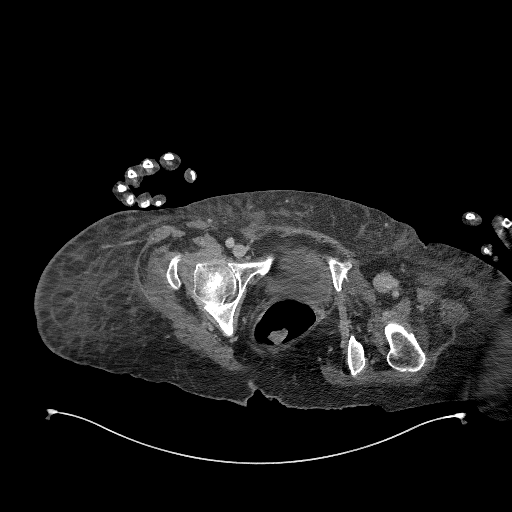
[im 210/749  soft-tissue]
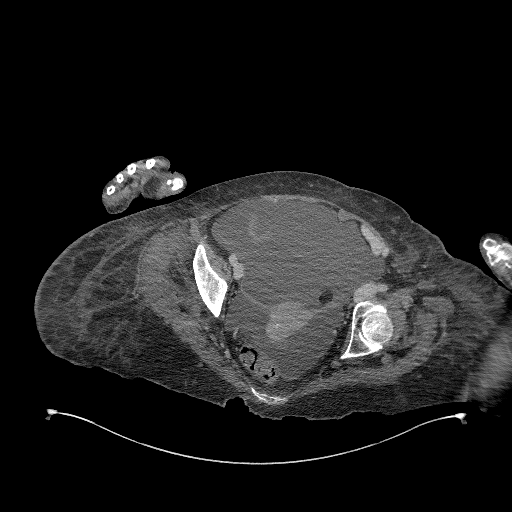
[im 270/749  soft-tissue]
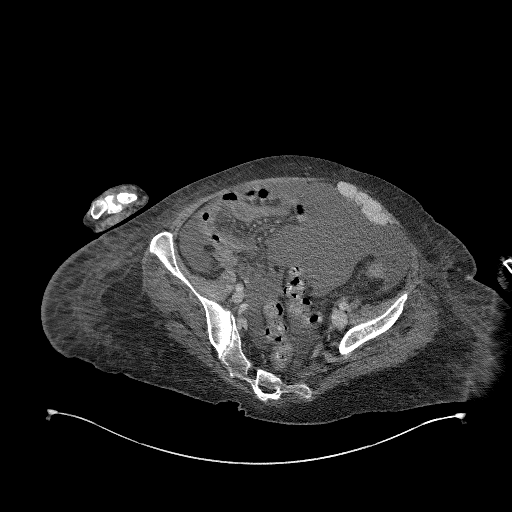
[im 330/749  soft-tissue]
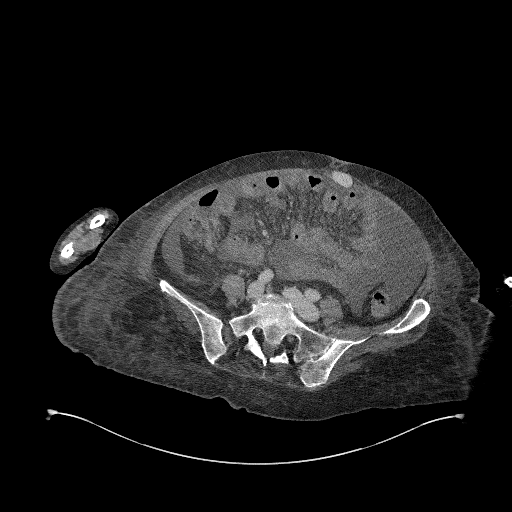
[im 389/749  soft-tissue]
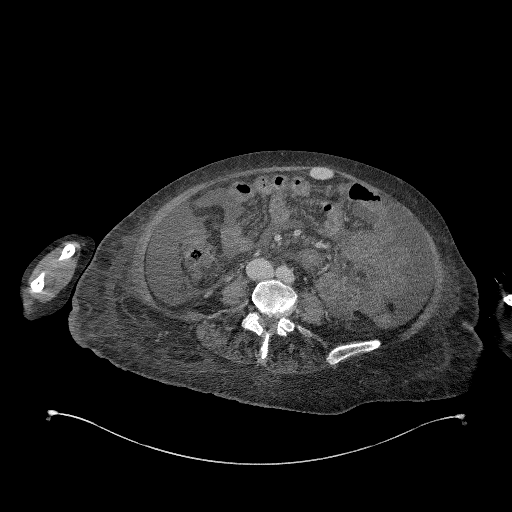
[im 419/749  soft-tissue]
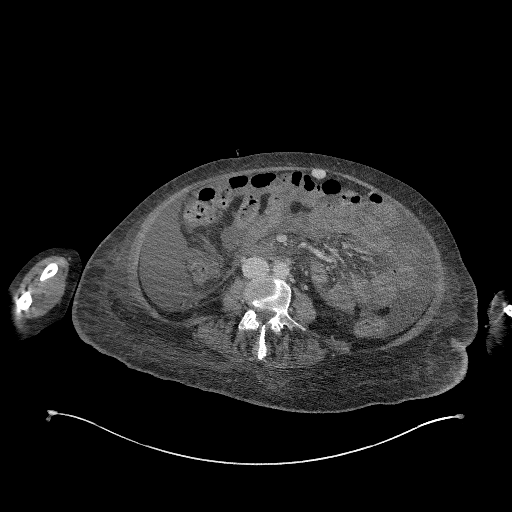
[im 479/749  soft-tissue]
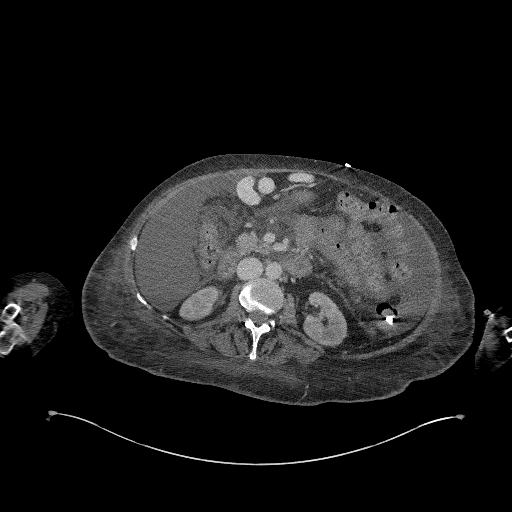
[im 479/749  bone]
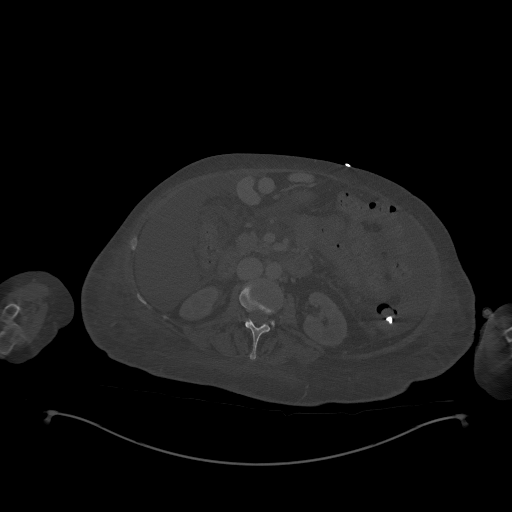
[im 539/749  soft-tissue]
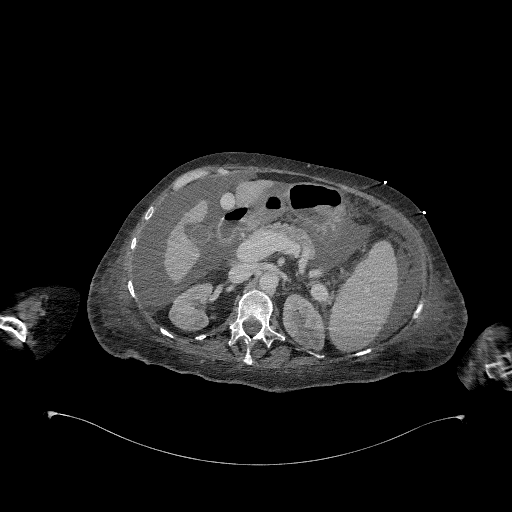
[im 599/749  soft-tissue]
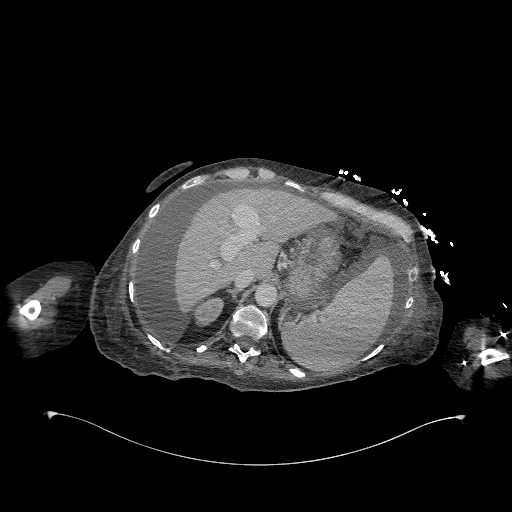
[im 659/749  soft-tissue]
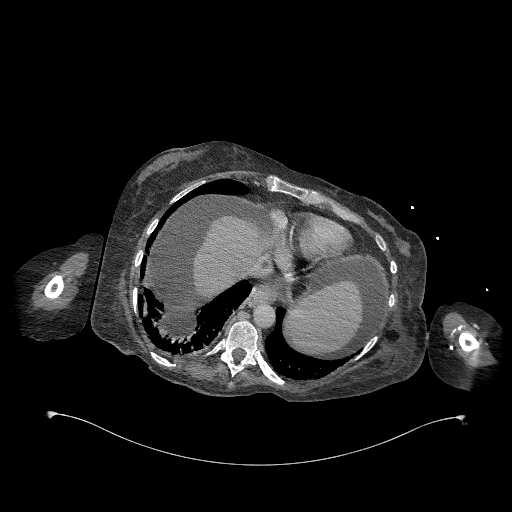
[im 719/749  soft-tissue]
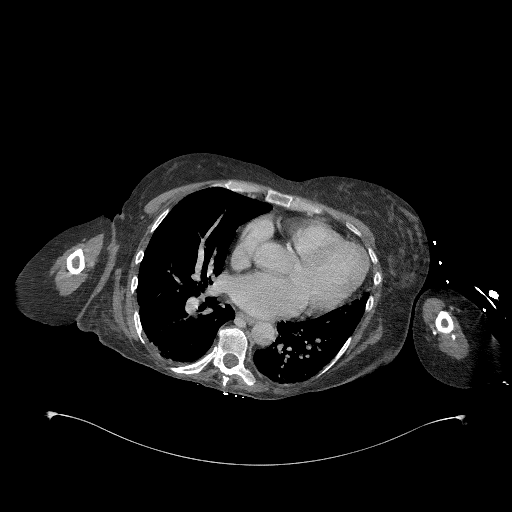

[Series 5: coronal st · coronal · 0.91mm/px · 3 of 117 slices shown]
[im 39/117  soft-tissue]
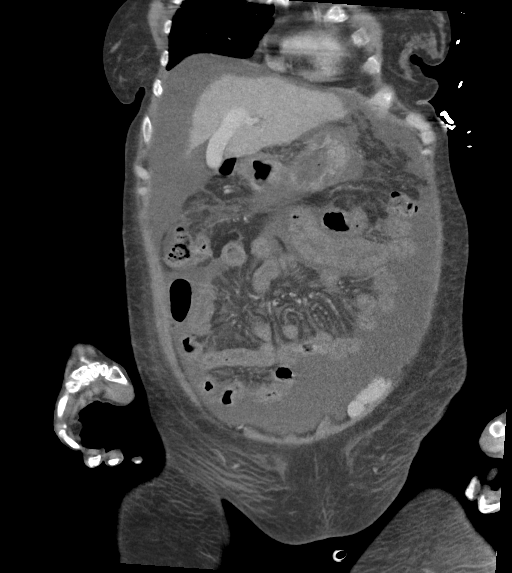
[im 52/117  soft-tissue]
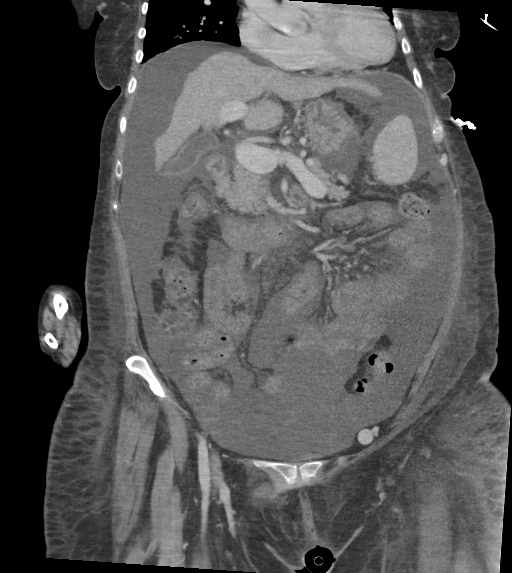
[im 65/117  soft-tissue]
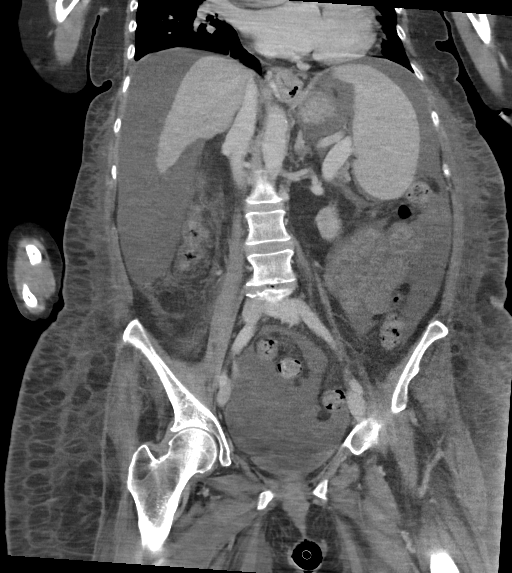

[16 of 46 positions shown; findings below may reference images not displayed]

RADIATION DOSE REDUCTION: This exam was performed according to the
departmental dose-optimization program which includes automated
exposure control, adjustment of the mA and/or kV according to
patient size and/or use of iterative reconstruction technique.

CONTRAST:  100mL OMNIPAQUE IOHEXOL 300 MG/ML  SOLN
FINDINGS: Lower chest: Lung bases demonstrate streaky atelectasis at the right
base. Mild cardiomegaly. Trace pleural effusion on the right

Hepatobiliary: Liver cirrhosis. No calcified gallstone or biliary
dilatation. No focal hepatic abnormality.

Pancreas: Unremarkable. No pancreatic ductal dilatation or
surrounding inflammatory changes.

Spleen: Enlarged measuring 15 cm AP.

Adrenals/Urinary Tract: Adrenal glands are normal. Kidneys show no
hydronephrosis. The bladder is decompressed

Stomach/Bowel: The stomach is nonenlarged. Small stone in the
appendix but no inflammation. No dilated small bowel. Diverticular
disease of the colon. No acute wall thickening.

Vascular/Lymphatic: Multiple gastroesophageal varices. Marked
dilatation of the portal vein with large varix to the left common
iliac vein. Nonaneurysmal aorta. No suspicious lymph nodes.

Reproductive: Calcified uterine fibroids.  No adnexal mass

Other: No free air. Large volume of ascites. Generalized
subcutaneous edema

Musculoskeletal: No acute osseous abnormality
IMPRESSION: 1. Liver cirrhosis with signs for portal hypertension including
splenomegaly, varices and large volume of ascites.
2. Calcified uterine fibroids
3. Cardiomegaly with trace right effusion
4. No evidence for a bowel obstruction.

## 2023-01-30 IMAGING — US US PARACENTESIS
1 series · 2 of 2 positions shown · non-contrast
Comparison: none

INDICATION: Cirrhosis, ascites

[Series 1: us paracentesis · 2 of 2 slices shown]
[im 1/2]
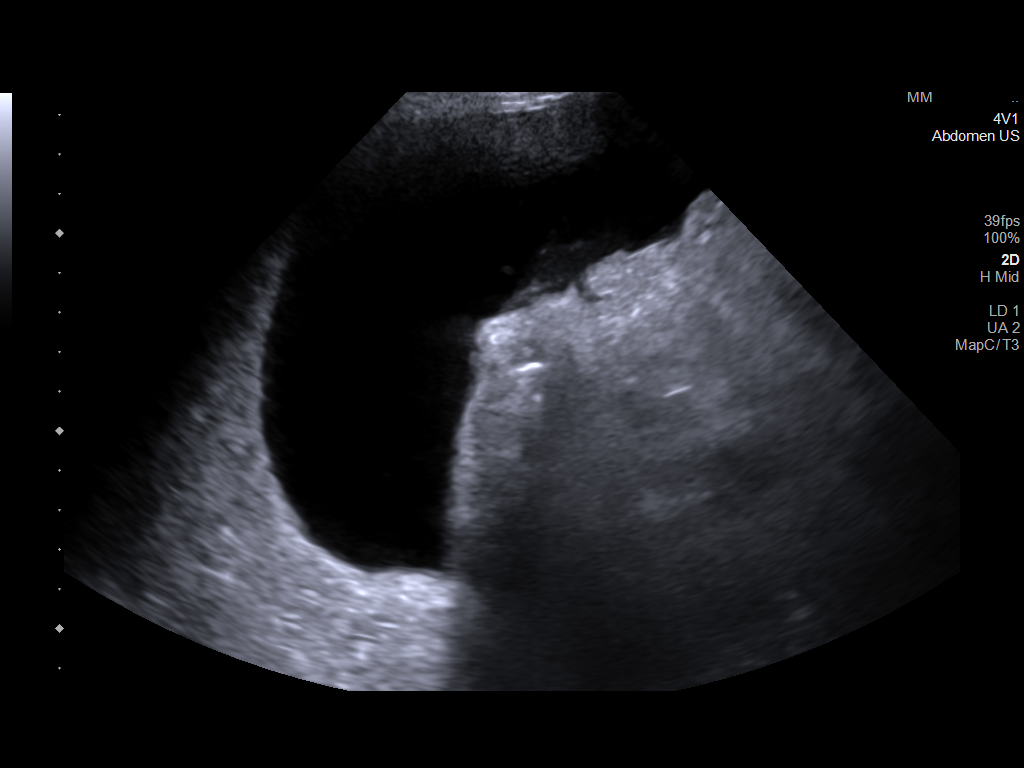
[im 2/2]
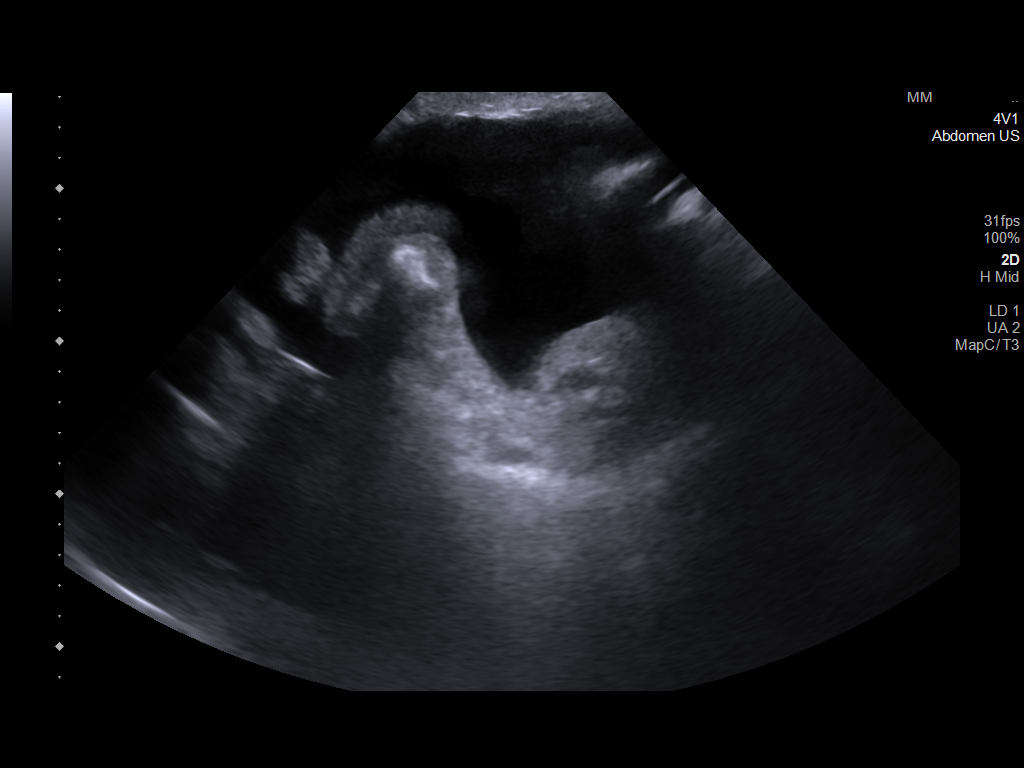

[2 of 2 positions shown; findings below may reference images not displayed]

EXAM:
ULTRASOUND GUIDED DIAGNOSTIC AND THERAPEUTIC PARACENTESIS

MEDICATIONS:
None.

COMPLICATIONS:
None immediate.

PROCEDURE:
Informed written consent was obtained from the patient after a
discussion of the risks, benefits and alternatives to treatment. A
timeout was performed prior to the initiation of the procedure.

Initial ultrasound scanning demonstrates a moderate amount of
ascites within the right lower abdominal quadrant. The right lower
abdomen was prepped and draped in the usual sterile fashion. 1%
lidocaine was used for local anesthesia.

Following this, a 5 French Yueh catheter was introduced. An
ultrasound image was saved for documentation purposes. The
paracentesis was performed. The catheter was removed and a dressing
was applied. The patient tolerated the procedure well without
immediate post procedural complication.
FINDINGS: A total of approximately 2.8 L of yellow ascitic fluid was removed.
Samples were sent to the laboratory as requested by the clinical
team.
IMPRESSION: Successful ultrasound-guided paracentesis yielding 2.8 liters of
peritoneal fluid.
